# Patient Record
Sex: Male | Born: 1970 | Race: White | Hispanic: No | Marital: Married | Smoking: Never smoker
Health system: Southern US, Community
[De-identification: ages and names within clinical notes are randomized; demographics above are authoritative.]

## PROBLEM LIST (undated history)

## (undated) DIAGNOSIS — H9312 Tinnitus, left ear: Secondary | ICD-10-CM

## (undated) DIAGNOSIS — H9192 Unspecified hearing loss, left ear: Secondary | ICD-10-CM

## (undated) DIAGNOSIS — F419 Anxiety disorder, unspecified: Secondary | ICD-10-CM

## (undated) HISTORY — PX: APPENDECTOMY: SHX54

---

## 2017-05-09 ENCOUNTER — Inpatient Hospital Stay (HOSPITAL_COMMUNITY)
Admission: EM | Admit: 2017-05-09 | Discharge: 2017-05-29 | DRG: 963 | Disposition: A | Discharge status: Rehabilitation Facility | Payer: BLUE CROSS/BLUE SHIELD | Attending: General Surgery | Admitting: General Surgery

## 2017-05-09 ENCOUNTER — Inpatient Hospital Stay (HOSPITAL_COMMUNITY): Payer: BLUE CROSS/BLUE SHIELD

## 2017-05-09 ENCOUNTER — Emergency Department (HOSPITAL_COMMUNITY): Payer: BLUE CROSS/BLUE SHIELD

## 2017-05-09 ENCOUNTER — Encounter (HOSPITAL_COMMUNITY): Payer: Self-pay | Admitting: Radiology

## 2017-05-09 ENCOUNTER — Other Ambulatory Visit: Payer: Self-pay

## 2017-05-09 DIAGNOSIS — R402142 Coma scale, eyes open, spontaneous, at arrival to emergency department: Secondary | ICD-10-CM | POA: Diagnosis present

## 2017-05-09 DIAGNOSIS — G8918 Other acute postprocedural pain: Secondary | ICD-10-CM

## 2017-05-09 DIAGNOSIS — R402342 Coma scale, best motor response, flexion withdrawal, at arrival to emergency department: Secondary | ICD-10-CM | POA: Diagnosis present

## 2017-05-09 DIAGNOSIS — S0219XA Other fracture of base of skull, initial encounter for closed fracture: Secondary | ICD-10-CM | POA: Diagnosis present

## 2017-05-09 DIAGNOSIS — S069X9A Unspecified intracranial injury with loss of consciousness of unspecified duration, initial encounter: Secondary | ICD-10-CM

## 2017-05-09 DIAGNOSIS — H9192 Unspecified hearing loss, left ear: Secondary | ICD-10-CM | POA: Diagnosis present

## 2017-05-09 DIAGNOSIS — S065X0A Traumatic subdural hemorrhage without loss of consciousness, initial encounter: Secondary | ICD-10-CM | POA: Diagnosis present

## 2017-05-09 DIAGNOSIS — S271XXA Traumatic hemothorax, initial encounter: Secondary | ICD-10-CM | POA: Diagnosis present

## 2017-05-09 DIAGNOSIS — H9319 Tinnitus, unspecified ear: Secondary | ICD-10-CM | POA: Diagnosis present

## 2017-05-09 DIAGNOSIS — J9690 Respiratory failure, unspecified, unspecified whether with hypoxia or hypercapnia: Secondary | ICD-10-CM

## 2017-05-09 DIAGNOSIS — S0101XA Laceration without foreign body of scalp, initial encounter: Secondary | ICD-10-CM | POA: Diagnosis present

## 2017-05-09 DIAGNOSIS — G478 Other sleep disorders: Secondary | ICD-10-CM | POA: Diagnosis not present

## 2017-05-09 DIAGNOSIS — S2241XA Multiple fractures of ribs, right side, initial encounter for closed fracture: Secondary | ICD-10-CM | POA: Diagnosis present

## 2017-05-09 DIAGNOSIS — Z823 Family history of stroke: Secondary | ICD-10-CM

## 2017-05-09 DIAGNOSIS — F1910 Other psychoactive substance abuse, uncomplicated: Secondary | ICD-10-CM

## 2017-05-09 DIAGNOSIS — S065XAA Traumatic subdural hemorrhage with loss of consciousness status unknown, initial encounter: Secondary | ICD-10-CM

## 2017-05-09 DIAGNOSIS — D696 Thrombocytopenia, unspecified: Secondary | ICD-10-CM

## 2017-05-09 DIAGNOSIS — S020XXA Fracture of vault of skull, initial encounter for closed fracture: Secondary | ICD-10-CM | POA: Diagnosis present

## 2017-05-09 DIAGNOSIS — E8809 Other disorders of plasma-protein metabolism, not elsewhere classified: Secondary | ICD-10-CM | POA: Diagnosis present

## 2017-05-09 DIAGNOSIS — Z886 Allergy status to analgesic agent status: Secondary | ICD-10-CM | POA: Diagnosis not present

## 2017-05-09 DIAGNOSIS — S062X9A Diffuse traumatic brain injury with loss of consciousness of unspecified duration, initial encounter: Secondary | ICD-10-CM | POA: Diagnosis not present

## 2017-05-09 DIAGNOSIS — D62 Acute posthemorrhagic anemia: Secondary | ICD-10-CM | POA: Diagnosis present

## 2017-05-09 DIAGNOSIS — E876 Hypokalemia: Secondary | ICD-10-CM | POA: Diagnosis not present

## 2017-05-09 DIAGNOSIS — R402242 Coma scale, best verbal response, confused conversation, at arrival to emergency department: Secondary | ICD-10-CM | POA: Diagnosis present

## 2017-05-09 DIAGNOSIS — Z87820 Personal history of traumatic brain injury: Secondary | ICD-10-CM | POA: Diagnosis not present

## 2017-05-09 DIAGNOSIS — R4587 Impulsiveness: Secondary | ICD-10-CM | POA: Diagnosis present

## 2017-05-09 DIAGNOSIS — I10 Essential (primary) hypertension: Secondary | ICD-10-CM | POA: Diagnosis present

## 2017-05-09 DIAGNOSIS — J690 Pneumonitis due to inhalation of food and vomit: Secondary | ICD-10-CM | POA: Diagnosis not present

## 2017-05-09 DIAGNOSIS — M25511 Pain in right shoulder: Secondary | ICD-10-CM | POA: Diagnosis not present

## 2017-05-09 DIAGNOSIS — S0231XA Fracture of orbital floor, right side, initial encounter for closed fracture: Secondary | ICD-10-CM | POA: Diagnosis present

## 2017-05-09 DIAGNOSIS — M79671 Pain in right foot: Secondary | ICD-10-CM

## 2017-05-09 DIAGNOSIS — S36892A Contusion of other intra-abdominal organs, initial encounter: Secondary | ICD-10-CM | POA: Diagnosis present

## 2017-05-09 DIAGNOSIS — R0682 Tachypnea, not elsewhere classified: Secondary | ICD-10-CM | POA: Diagnosis not present

## 2017-05-09 DIAGNOSIS — G4700 Insomnia, unspecified: Secondary | ICD-10-CM | POA: Diagnosis present

## 2017-05-09 DIAGNOSIS — R1310 Dysphagia, unspecified: Secondary | ICD-10-CM | POA: Diagnosis present

## 2017-05-09 DIAGNOSIS — S065X9A Traumatic subdural hemorrhage with loss of consciousness of unspecified duration, initial encounter: Secondary | ICD-10-CM | POA: Diagnosis not present

## 2017-05-09 DIAGNOSIS — S020XXD Fracture of vault of skull, subsequent encounter for fracture with routine healing: Secondary | ICD-10-CM | POA: Diagnosis present

## 2017-05-09 DIAGNOSIS — R569 Unspecified convulsions: Secondary | ICD-10-CM | POA: Diagnosis present

## 2017-05-09 DIAGNOSIS — R000 Tachycardia, unspecified: Secondary | ICD-10-CM

## 2017-05-09 DIAGNOSIS — J9601 Acute respiratory failure with hypoxia: Secondary | ICD-10-CM | POA: Diagnosis present

## 2017-05-09 DIAGNOSIS — Z978 Presence of other specified devices: Secondary | ICD-10-CM

## 2017-05-09 DIAGNOSIS — R451 Restlessness and agitation: Secondary | ICD-10-CM | POA: Diagnosis not present

## 2017-05-09 DIAGNOSIS — R52 Pain, unspecified: Secondary | ICD-10-CM | POA: Diagnosis not present

## 2017-05-09 DIAGNOSIS — R509 Fever, unspecified: Secondary | ICD-10-CM

## 2017-05-09 DIAGNOSIS — S069X9S Unspecified intracranial injury with loss of consciousness of unspecified duration, sequela: Secondary | ICD-10-CM | POA: Diagnosis not present

## 2017-05-09 DIAGNOSIS — S069X9D Unspecified intracranial injury with loss of consciousness of unspecified duration, subsequent encounter: Secondary | ICD-10-CM | POA: Diagnosis not present

## 2017-05-09 HISTORY — DX: Unspecified hearing loss, left ear: H91.92

## 2017-05-09 HISTORY — DX: Anxiety disorder, unspecified: F41.9

## 2017-05-09 HISTORY — DX: Gilbert syndrome: E80.4

## 2017-05-09 HISTORY — DX: Tinnitus, left ear: H93.12

## 2017-05-09 LAB — COMPREHENSIVE METABOLIC PANEL
ALK PHOS: 84 U/L (ref 38–126)
ALT: 27 U/L (ref 17–63)
ANION GAP: 12 (ref 5–15)
AST: 35 U/L (ref 15–41)
Albumin: 4.2 g/dL (ref 3.5–5.0)
BILIRUBIN TOTAL: 1.1 mg/dL (ref 0.3–1.2)
BUN: 12 mg/dL (ref 6–20)
CALCIUM: 9.1 mg/dL (ref 8.9–10.3)
CO2: 22 mmol/L (ref 22–32)
Chloride: 104 mmol/L (ref 101–111)
Creatinine, Ser: 0.95 mg/dL (ref 0.61–1.24)
GFR calc Af Amer: >60 mL/min (ref >60)
GLUCOSE: 140 mg/dL — AB (ref 65–99)
POTASSIUM: 3 mmol/L — AB (ref 3.5–5.1)
Sodium: 138 mmol/L (ref 135–145)
TOTAL PROTEIN: 7.1 g/dL (ref 6.5–8.1)

## 2017-05-09 LAB — BPAM FFP
BLOOD PRODUCT EXPIRATION DATE: 201812232359
Blood Product Expiration Date: 201812232359
ISSUE DATE / TIME: 201812151347
ISSUE DATE / TIME: 201812151347
UNIT TYPE AND RH: 6200
Unit Type and Rh: 6200

## 2017-05-09 LAB — CBC
HEMATOCRIT: 43.5 % (ref 39.0–52.0)
HEMOGLOBIN: 15.5 g/dL (ref 13.0–17.0)
MCH: 33.8 pg (ref 26.0–34.0)
MCHC: 35.6 g/dL (ref 30.0–36.0)
MCV: 95 fL (ref 78.0–100.0)
Platelets: 257 10*3/uL (ref 150–400)
RBC: 4.58 MIL/uL (ref 4.22–5.81)
RDW: 12.3 % (ref 11.5–15.5)
WBC: 13.7 10*3/uL — AB (ref 4.0–10.5)

## 2017-05-09 LAB — I-STAT ARTERIAL BLOOD GAS, ED
Acid-base deficit: 6 mmol/L — ABNORMAL HIGH (ref 0.0–2.0)
Bicarbonate: 21.7 mmol/L (ref 20.0–28.0)
O2 Saturation: 77 %
PH ART: 7.225 — AB (ref 7.350–7.450)
TCO2: 23 mmol/L (ref 22–32)
pCO2 arterial: 52.4 mmHg — ABNORMAL HIGH (ref 32.0–48.0)
pO2, Arterial: 50 mmHg — ABNORMAL LOW (ref 83.0–108.0)

## 2017-05-09 LAB — TYPE AND SCREEN
ABO/RH(D): O POS
Antibody Screen: NEGATIVE
UNIT DIVISION: 0
Unit division: 0

## 2017-05-09 LAB — I-STAT CHEM 8, ED
BUN: 14 mg/dL (ref 6–20)
CALCIUM ION: 1.13 mmol/L — AB (ref 1.15–1.40)
CHLORIDE: 104 mmol/L (ref 101–111)
CREATININE: 0.9 mg/dL (ref 0.61–1.24)
Glucose, Bld: 142 mg/dL — ABNORMAL HIGH (ref 65–99)
HCT: 45 % (ref 39.0–52.0)
Hemoglobin: 15.3 g/dL (ref 13.0–17.0)
Potassium: 3.3 mmol/L — ABNORMAL LOW (ref 3.5–5.1)
Sodium: 143 mmol/L (ref 135–145)
TCO2: 25 mmol/L (ref 22–32)

## 2017-05-09 LAB — PREPARE FRESH FROZEN PLASMA
UNIT DIVISION: 0
Unit division: 0

## 2017-05-09 LAB — RAPID URINE DRUG SCREEN, HOSP PERFORMED
AMPHETAMINES: POSITIVE — AB
BENZODIAZEPINES: POSITIVE — AB
Barbiturates: NOT DETECTED
Cocaine: NOT DETECTED
Opiates: NOT DETECTED
TETRAHYDROCANNABINOL: POSITIVE — AB

## 2017-05-09 LAB — URINALYSIS, ROUTINE W REFLEX MICROSCOPIC
Bilirubin Urine: NEGATIVE
GLUCOSE, UA: NEGATIVE mg/dL
Ketones, ur: NEGATIVE mg/dL
Leukocytes, UA: NEGATIVE
NITRITE: NEGATIVE
PH: 5 (ref 5.0–8.0)
Protein, ur: 30 mg/dL — AB
Specific Gravity, Urine: >1.046 — ABNORMAL HIGH (ref 1.005–1.030)

## 2017-05-09 LAB — ETHANOL

## 2017-05-09 LAB — BPAM RBC
BLOOD PRODUCT EXPIRATION DATE: 201812222359
Blood Product Expiration Date: 201812212359
ISSUE DATE / TIME: 201812151346
ISSUE DATE / TIME: 201812151346
UNIT TYPE AND RH: 9500
Unit Type and Rh: 9500

## 2017-05-09 LAB — ABO/RH: ABO/RH(D): O POS

## 2017-05-09 LAB — MRSA PCR SCREENING: MRSA BY PCR: NEGATIVE

## 2017-05-09 LAB — PROTIME-INR
INR: 1.07
PROTHROMBIN TIME: 13.8 s (ref 11.4–15.2)

## 2017-05-09 LAB — TRIGLYCERIDES: TRIGLYCERIDES: 76 mg/dL (ref <150)

## 2017-05-09 LAB — I-STAT CG4 LACTIC ACID, ED: Lactic Acid, Venous: 3.49 mmol/L (ref 0.5–1.9)

## 2017-05-09 LAB — CDS SEROLOGY

## 2017-05-09 LAB — LACTIC ACID, PLASMA: LACTIC ACID, VENOUS: 3.3 mmol/L — AB (ref 0.5–1.9)

## 2017-05-09 MED ORDER — SODIUM CHLORIDE 0.9 % IV SOLN
INTRAVENOUS | Status: DC
  Administered 2017-05-09 – 2017-05-20 (×14): via INTRAVENOUS
  Administered 2017-05-20: 75 mL/h via INTRAVENOUS
  Administered 2017-05-21 – 2017-05-23 (×4): via INTRAVENOUS
  Administered 2017-05-24: 75 mL/h via INTRAVENOUS
  Administered 2017-05-25: 1000 mL via INTRAVENOUS
  Administered 2017-05-26 – 2017-05-27 (×2): via INTRAVENOUS

## 2017-05-09 MED ORDER — ONDANSETRON 4 MG PO TBDP: 4.0000 mg | ORAL_TABLET | Freq: Four times a day (QID) | ORAL | Status: DC | PRN

## 2017-05-09 MED ORDER — ROCURONIUM BROMIDE 50 MG/5ML IV SOLN
INTRAVENOUS | Status: AC | PRN
  Administered 2017-05-09: 80 mg via INTRAVENOUS

## 2017-05-09 MED ORDER — IOPAMIDOL (ISOVUE-300) INJECTION 61%
INTRAVENOUS | Status: AC
  Administered 2017-05-09: 100 mL
  Filled 2017-05-09: qty 100

## 2017-05-09 MED ORDER — ORAL CARE MOUTH RINSE: 15.0000 mL | Freq: Four times a day (QID) | OROMUCOSAL | Status: DC

## 2017-05-09 MED ORDER — SODIUM CHLORIDE 0.9 % IV SOLN
500.0000 mg | Freq: Two times a day (BID) | INTRAVENOUS | Status: DC
  Administered 2017-05-09 – 2017-05-21 (×24): 500 mg via INTRAVENOUS
  Filled 2017-05-09 (×25): qty 5

## 2017-05-09 MED ORDER — PROPOFOL 1000 MG/100ML IV EMUL
5.0000 ug/kg/min | Freq: Once | INTRAVENOUS | Status: AC
  Filled 2017-05-09: qty 100

## 2017-05-09 MED ORDER — FENTANYL CITRATE (PF) 100 MCG/2ML IJ SOLN
INTRAMUSCULAR | Status: AC
  Filled 2017-05-09: qty 2

## 2017-05-09 MED ORDER — PROPOFOL 1000 MG/100ML IV EMUL
INTRAVENOUS | Status: AC
  Administered 2017-05-09: 14:00:00
  Filled 2017-05-09: qty 100

## 2017-05-09 MED ORDER — LORAZEPAM 2 MG/ML IJ SOLN
INTRAMUSCULAR | Status: AC
  Filled 2017-05-09: qty 1

## 2017-05-09 MED ORDER — PANTOPRAZOLE SODIUM 40 MG PO TBEC
40.0000 mg | DELAYED_RELEASE_TABLET | Freq: Every day | ORAL | Status: DC
  Administered 2017-05-15 – 2017-05-20 (×5): 40 mg via ORAL
  Filled 2017-05-09 (×5): qty 1

## 2017-05-09 MED ORDER — LORAZEPAM 2 MG/ML IJ SOLN
INTRAMUSCULAR | Status: AC | PRN
  Administered 2017-05-09: 1 mg via INTRAVENOUS

## 2017-05-09 MED ORDER — ORAL CARE MOUTH RINSE
15.0000 mL | OROMUCOSAL | Status: DC
  Administered 2017-05-09 – 2017-05-13 (×29): 15 mL via OROMUCOSAL

## 2017-05-09 MED ORDER — SENNOSIDES 8.8 MG/5ML PO SYRP: 5.0000 mL | ORAL_SOLUTION | Freq: Two times a day (BID) | ORAL | Status: DC | PRN

## 2017-05-09 MED ORDER — PROPOFOL 1000 MG/100ML IV EMUL
INTRAVENOUS | Status: AC
  Filled 2017-05-09: qty 100

## 2017-05-09 MED ORDER — ETOMIDATE 2 MG/ML IV SOLN
INTRAVENOUS | Status: AC | PRN
  Administered 2017-05-09: 20 mg via INTRAVENOUS

## 2017-05-09 MED ORDER — PROPOFOL 1000 MG/100ML IV EMUL
0.0000 ug/kg/min | INTRAVENOUS | Status: DC
  Administered 2017-05-09 – 2017-05-10 (×6): 30 ug/kg/min via INTRAVENOUS
  Administered 2017-05-11 – 2017-05-12 (×5): 40 ug/kg/min via INTRAVENOUS
  Filled 2017-05-09 (×12): qty 100

## 2017-05-09 MED ORDER — SODIUM CHLORIDE 0.9 % IV SOLN
INTRAVENOUS | Status: AC | PRN
  Administered 2017-05-09: 1000 mL via INTRAVENOUS

## 2017-05-09 MED ORDER — ONDANSETRON HCL 4 MG/2ML IJ SOLN
4.0000 mg | Freq: Four times a day (QID) | INTRAMUSCULAR | Status: DC | PRN
  Filled 2017-05-09: qty 2

## 2017-05-09 MED ORDER — FENTANYL 2500MCG IN NS 250ML (10MCG/ML) PREMIX INFUSION
25.0000 ug/h | INTRAVENOUS | Status: DC
  Administered 2017-05-09: 50 ug/h via INTRAVENOUS
  Administered 2017-05-10 (×2): 75 ug/h via INTRAVENOUS
  Administered 2017-05-12: 100 ug/h via INTRAVENOUS
  Filled 2017-05-09 (×4): qty 250

## 2017-05-09 MED ORDER — FENTANYL BOLUS VIA INFUSION
50.0000 ug | INTRAVENOUS | Status: DC | PRN
  Administered 2017-05-10: 50 ug via INTRAVENOUS
  Filled 2017-05-09: qty 50

## 2017-05-09 MED ORDER — PANTOPRAZOLE SODIUM 40 MG IV SOLR
40.0000 mg | Freq: Every day | INTRAVENOUS | Status: DC
  Administered 2017-05-09 – 2017-05-16 (×7): 40 mg via INTRAVENOUS
  Filled 2017-05-09 (×7): qty 40

## 2017-05-09 MED ORDER — BISACODYL 10 MG RE SUPP
10.0000 mg | Freq: Every day | RECTAL | Status: DC | PRN
  Filled 2017-05-09: qty 1

## 2017-05-09 MED ORDER — FENTANYL CITRATE (PF) 100 MCG/2ML IJ SOLN: 50.0000 ug | Freq: Once | INTRAMUSCULAR | Status: DC

## 2017-05-09 MED ORDER — CHLORHEXIDINE GLUCONATE 0.12% ORAL RINSE (MEDLINE KIT): 15.0000 mL | Freq: Two times a day (BID) | OROMUCOSAL | Status: DC

## 2017-05-09 MED ORDER — CHLORHEXIDINE GLUCONATE 0.12% ORAL RINSE (MEDLINE KIT)
15.0000 mL | Freq: Two times a day (BID) | OROMUCOSAL | Status: DC
  Administered 2017-05-09 – 2017-05-29 (×37): 15 mL via OROMUCOSAL

## 2017-05-09 MED ORDER — LACTATED RINGERS IV BOLUS (SEPSIS)
1000.0000 mL | Freq: Once | INTRAVENOUS | Status: AC
  Administered 2017-05-09: 1000 mL via INTRAVENOUS

## 2017-05-09 NOTE — ED Notes (Signed)
[name] at bedside.  Abigail,PA in to talk with family.

## 2017-05-09 NOTE — ED Provider Notes (Signed)
..  Laceration Repair Date/Time: 05/09/2017 3:18 PM Performed by: Arthor CaptainHarris, Travonta Gill, PA-C Authorized by: Arthor CaptainHarris, Phi Avans, PA-C   Consent:    Consent obtained:  Emergent situation Anesthesia (see MAR for exact dosages):    Anesthesia method:  None Laceration details:    Location:  Scalp   Scalp location:  Frontal Repair type:    Repair type:  Simple Treatment:    Area cleansed with:  Betadine   Amount of cleaning:  Standard   Irrigation solution:  Sterile saline Skin repair:    Repair method:  Staples   Number of staples:  [id number]      Kenia Teagarden, PA-C 05/09/17 1520    Rolan BuccoBelfi, Melanie, MD 05/09/17 1550

## 2017-05-09 NOTE — Progress Notes (Signed)
ABG ordered for post intubation however venous sample obtained.  Will get another RT to attempt and get results into computer.     Ref. Range 05/09/2017 15:28  Sample type Unknown ARTERIAL  pH, Arterial Latest Ref Range: 7.350 - 7.450  7.225 (L)  pCO2 arterial Latest Ref Range: 32.0 - 48.0 mmHg 52.4 (H)  pO2, Arterial Latest Ref Range: 83.0 - 108.0 mmHg 50.0 (L)  TCO2 Latest Ref Range: 22 - 32 mmol/L 23  Acid-base deficit Latest Ref Range: 0.0 - 2.0 mmol/L 6.0 (H)  Bicarbonate Latest Ref Range: 20.0 - 28.0 mmol/L 21.7  O2 Saturation Latest Units: % 77.0  Patient temperature Unknown 98.6 F  Collection site Unknown RADIAL, [facility]EN[facility] T.Marland Kitchen..Marland Kitchen
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
## 2017-05-09 NOTE — ED Notes (Signed)
MD Meriam SpragueBeverly called neuro surgery to make aware of patient.
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
## 2017-05-09 NOTE — ED Notes (Signed)
Pt had seizure like activity while Abby PA was placing sutures and was given 1mg  Ativan IV.

## 2017-05-09 NOTE — ED Notes (Signed)
Taken to ct with RN present

## 2017-05-09 NOTE — ED Notes (Signed)
Bed side report given to Woodroe ChenKim Fields RN

## 2017-05-09 NOTE — Progress Notes (Signed)
Patient transported from ED to room 4N28 without complications.
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
## 2017-05-10 ENCOUNTER — Inpatient Hospital Stay (HOSPITAL_COMMUNITY): Payer: BLUE CROSS/BLUE SHIELD

## 2017-05-10 ENCOUNTER — Other Ambulatory Visit: Payer: Self-pay

## 2017-05-10 LAB — HIV ANTIBODY (ROUTINE TESTING W REFLEX): HIV Screen 4th Generation wRfx: NONREACTIVE

## 2017-05-10 LAB — CBC
HCT: 35.5 % — ABNORMAL LOW (ref 39.0–52.0)
Hemoglobin: 12.2 g/dL — ABNORMAL LOW (ref 13.0–17.0)
MCH: 33.9 pg (ref 26.0–34.0)
MCHC: 34.4 g/dL (ref 30.0–36.0)
MCV: 98.6 fL (ref 78.0–100.0)
PLATELETS: 147 10*3/uL — AB (ref 150–400)
RBC: 3.6 MIL/uL — ABNORMAL LOW (ref 4.22–5.81)
RDW: 13 % (ref 11.5–15.5)
WBC: 8 10*3/uL (ref 4.0–10.5)

## 2017-05-10 LAB — BASIC METABOLIC PANEL
Anion gap: 9 (ref 5–15)
BUN: 12 mg/dL (ref 6–20)
CALCIUM: 8 mg/dL — AB (ref 8.9–10.3)
CO2: 20 mmol/L — ABNORMAL LOW (ref 22–32)
Chloride: 110 mmol/L (ref 101–111)
Creatinine, Ser: 0.78 mg/dL (ref 0.61–1.24)
GFR calc Af Amer: >60 mL/min (ref >60)
GLUCOSE: 83 mg/dL (ref 65–99)
POTASSIUM: 4 mmol/L (ref 3.5–5.1)
Sodium: 139 mmol/L (ref 135–145)

## 2017-05-10 LAB — PROTIME-INR
INR: 1.25
PROTHROMBIN TIME: 15.6 s — AB (ref 11.4–15.2)
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
## 2017-05-11 LAB — PHOSPHORUS
PHOSPHORUS: 3.5 mg/dL (ref 2.5–4.6)
Phosphorus: 2.8 mg/dL (ref 2.5–4.6)

## 2017-05-11 LAB — MAGNESIUM
MAGNESIUM: 1.8 mg/dL (ref 1.7–2.4)
MAGNESIUM: 1.7 mg/dL (ref 1.7–2.4)

## 2017-05-11 LAB — GLUCOSE, CAPILLARY
GLUCOSE-CAPILLARY: 88 mg/dL (ref 65–99)
GLUCOSE-CAPILLARY: 102 mg/dL — AB (ref 65–99)
Glucose-Capillary: 121 mg/dL — ABNORMAL HIGH (ref 65–99)

## 2017-05-11 LAB — BLOOD PRODUCT ORDER (VERBAL) VERIFICATION

## 2017-05-11 MED ORDER — PRO-STAT SUGAR FREE PO LIQD: 30.0000 mL | Freq: Two times a day (BID) | ORAL | Status: DC

## 2017-05-11 MED ORDER — VITAL HIGH PROTEIN PO LIQD: 1000.0000 mL | ORAL | Status: DC

## 2017-05-11 MED ORDER — ACETAMINOPHEN 160 MG/5ML PO SOLN
650.0000 mg | ORAL | Status: DC | PRN
  Administered 2017-05-11: 650 mg
  Filled 2017-05-11: qty 20.3

## 2017-05-11 MED ORDER — VITAL HIGH PROTEIN PO LIQD
1000.0000 mL | ORAL | Status: DC
  Administered 2017-05-11: 1000 mL
  Filled 2017-05-11 (×2): qty 1000
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
## 2017-05-12 ENCOUNTER — Inpatient Hospital Stay (HOSPITAL_COMMUNITY): Payer: BLUE CROSS/BLUE SHIELD

## 2017-05-12 ENCOUNTER — Encounter (HOSPITAL_COMMUNITY): Payer: Self-pay

## 2017-05-12 LAB — GLUCOSE, CAPILLARY
GLUCOSE-CAPILLARY: 118 mg/dL — AB (ref 65–99)
GLUCOSE-CAPILLARY: 122 mg/dL — AB (ref 65–99)
Glucose-Capillary: 102 mg/dL — ABNORMAL HIGH (ref 65–99)
Glucose-Capillary: 119 mg/dL — ABNORMAL HIGH (ref 65–99)
Glucose-Capillary: 114 mg/dL — ABNORMAL HIGH (ref 65–99)

## 2017-05-12 LAB — BASIC METABOLIC PANEL
ANION GAP: 4 — AB (ref 5–15)
BUN: 9 mg/dL (ref 6–20)
CHLORIDE: 111 mmol/L (ref 101–111)
CO2: 24 mmol/L (ref 22–32)
Calcium: 7.8 mg/dL — ABNORMAL LOW (ref 8.9–10.3)
Creatinine, Ser: 0.64 mg/dL (ref 0.61–1.24)
GFR calc non Af Amer: >60 mL/min (ref >60)
Glucose, Bld: 110 mg/dL — ABNORMAL HIGH (ref 65–99)
POTASSIUM: 3.4 mmol/L — AB (ref 3.5–5.1)
SODIUM: 139 mmol/L (ref 135–145)

## 2017-05-12 LAB — CBC
HCT: 25.8 % — ABNORMAL LOW (ref 39.0–52.0)
HEMOGLOBIN: 8.8 g/dL — AB (ref 13.0–17.0)
MCH: 33.2 pg (ref 26.0–34.0)
MCHC: 34.1 g/dL (ref 30.0–36.0)
MCV: 97.4 fL (ref 78.0–100.0)
Platelets: 133 10*3/uL — ABNORMAL LOW (ref 150–400)
RBC: 2.65 MIL/uL — AB (ref 4.22–5.81)
RDW: 12.7 % (ref 11.5–15.5)
WBC: 7 10*3/uL (ref 4.0–10.5)

## 2017-05-12 LAB — PHOSPHORUS
PHOSPHORUS: 1.7 mg/dL — AB (ref 2.5–4.6)
PHOSPHORUS: 2.3 mg/dL — AB (ref 2.5–4.6)

## 2017-05-12 LAB — MAGNESIUM
MAGNESIUM: 1.9 mg/dL (ref 1.7–2.4)
Magnesium: 1.9 mg/dL (ref 1.7–2.4)

## 2017-05-12 LAB — TRIGLYCERIDES: Triglycerides: 69 mg/dL (ref <150)

## 2017-05-12 MED ORDER — LORAZEPAM 2 MG/ML IJ SOLN
2.0000 mg | Freq: Once | INTRAMUSCULAR | Status: AC
  Administered 2017-05-12: 2 mg via INTRAMUSCULAR

## 2017-05-12 MED ORDER — DEXMEDETOMIDINE HCL IN NACL 400 MCG/100ML IV SOLN
0.4000 ug/kg/h | INTRAVENOUS | Status: DC
  Administered 2017-05-13 (×2): 0.8 ug/kg/h via INTRAVENOUS
  Administered 2017-05-13: 1.2 ug/kg/h via INTRAVENOUS
  Administered 2017-05-13: 0.8 ug/kg/h via INTRAVENOUS
  Administered 2017-05-13 (×2): 1 ug/kg/h via INTRAVENOUS
  Administered 2017-05-14: 1.2 ug/kg/h via INTRAVENOUS
  Administered 2017-05-14 (×2): 0.6 ug/kg/h via INTRAVENOUS
  Administered 2017-05-14: 0.7 ug/kg/h via INTRAVENOUS
  Administered 2017-05-14: 1.2 ug/kg/h via INTRAVENOUS
  Administered 2017-05-15: 0.8 ug/kg/h via INTRAVENOUS
  Administered 2017-05-15 (×2): 0.6 ug/kg/h via INTRAVENOUS
  Administered 2017-05-15 – 2017-05-16 (×2): 0.8 ug/kg/h via INTRAVENOUS
  Administered 2017-05-16: 0.7 ug/kg/h via INTRAVENOUS
  Administered 2017-05-16 (×2): 0.8 ug/kg/h via INTRAVENOUS
  Administered 2017-05-17 (×3): 1 ug/kg/h via INTRAVENOUS
  Administered 2017-05-17 – 2017-05-18 (×2): 0.8 ug/kg/h via INTRAVENOUS
  Administered 2017-05-18 (×5): 1 ug/kg/h via INTRAVENOUS
  Administered 2017-05-19: 0.8 ug/kg/h via INTRAVENOUS
  Administered 2017-05-19: 1 ug/kg/h via INTRAVENOUS
  Administered 2017-05-19 – 2017-05-20 (×2): 0.4 ug/kg/h via INTRAVENOUS
  Filled 2017-05-12 (×35): qty 100

## 2017-05-12 MED ORDER — DEXMEDETOMIDINE HCL IN NACL 200 MCG/50ML IV SOLN
0.4000 ug/kg/h | INTRAVENOUS | Status: DC
  Administered 2017-05-12: 0.6 ug/kg/h via INTRAVENOUS
  Administered 2017-05-12: 0.4 ug/kg/h via INTRAVENOUS
  Administered 2017-05-12: 0.5 ug/kg/h via INTRAVENOUS
  Filled 2017-05-12 (×4): qty 50

## 2017-05-12 MED ORDER — FENTANYL CITRATE (PF) 100 MCG/2ML IJ SOLN
50.0000 ug | INTRAMUSCULAR | Status: DC | PRN
  Administered 2017-05-12 – 2017-05-22 (×41): 100 ug via INTRAVENOUS
  Filled 2017-05-12 (×43): qty 2

## 2017-05-12 MED ORDER — LORAZEPAM 2 MG/ML IJ SOLN
INTRAMUSCULAR | Status: AC
  Filled 2017-05-12: qty 1

## 2017-05-12 MED ORDER — HALOPERIDOL LACTATE 5 MG/ML IJ SOLN
5.0000 mg | Freq: Four times a day (QID) | INTRAMUSCULAR | Status: DC | PRN
  Administered 2017-05-12 – 2017-05-15 (×3): 5 mg via INTRAMUSCULAR
  Filled 2017-05-12 (×3): qty 1
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
## 2017-05-12 NOTE — Procedures (Signed)
Extubation Procedure Note  Patient Details:   Name: Anthony Singleton DOB: 03-06-1971 MRN: [id number]   Airway Documentation:  Airway 8 mm (Active)  Secured at (cm) 24 cm 05/12/2017  7:30 AM  Measured From Lips 05/12/2017  7:30 AM  Secured Location Center 05/12/2017  7:30 AM  Secured By [employer] Tube Holder 05/12/2017  7:30 AM  Tube Holder Repositioned Yes 05/12/2017  7:30 AM  Cuff Pressure (cm H2O) 28 cm H2O 05/11/2017 11:11 PM  Site Condition Dry 05/12/2017  7:30 AM    Evaluation  O2 sats: stable throughout Complications: No apparent complications Patient did tolerate procedure well. Bilateral Breath Sounds: Clear, Diminished   Yes   Patient was extubated to a 4L [place] without any complications, dyspnea or stridor noted.   Amear Strojny, [name] 05/12/2017, 8:20 AM

## 2017-05-12 NOTE — Consult Note (Signed)
Follow-up - no interval CT head, supposedly scheduled for tomorrow.  Patient still heavily sedated.  Zenaida NieceVan: OD: Unable, pupils pharmacologically miotic, minimally reactive OS: Unable, pupils pharmacologically miotic, minimally reactive  CVF: OD: Unable OS: Unable  EOM: OD: Unable due to sedation OS: Unable due to sedation  Pupils: OD: 2.25- 1.775mm, no APD OS: 2.25- 1.745mm, no APD  IOP: by [place] OD: 18 OS: 11  External: OD: 2+ Periorbital edema and 2+ ecchymosis, no lid lacerations, mild proptosis OS: Trace periorbital edema and ecchymosis, no lid lacerations  PF: 0/0  Pen Light Exam: L/L: OD: No lid lacerations OS: No lid lacerations  C/S: OD: Trace temporal chemosis, trace injection, trace nasal chemosis, no SCH OS: Trace injection and chemosis, no lacerations, no SCH  K: OD: clear, no abnormal staining OS: clear, no abnormal staining  A/C: OD: grossly deep and quiet appearing by pen light, formed OS: grossly deep and quiet appearing by pen light, formed  I: OD: round and regular OS: round and regular  L: OD: Clear OS: Clear  A/P:  1. Right Orbital Roof and Floor Fractures - No clinical evidence of open globe - Monitor, no intervention from ophthalmic standpoint  2. Right Intraorbital Hematoma - Recommend repeat CT Head - I will review from the office - IOP is stable -- monitor -- no sign of compartment syndrome or compressive optic neuropathy - If hematoma continues to improve, I will sign off unless clinically indicated for re-consult.   - Please call with any questions.   Wynell Balloonhristopher Weaver, MD,[facility] Ophthalmology [phone]
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
## 2017-05-12 NOTE — Progress Notes (Signed)
Rehab Admissions Coordinator Note:  Patient was screened by Trish MageLogue, Hiedi Touchton M for appropriateness for an Inpatient Acute Rehab Consult.  At this time, we are recommending Inpatient Rehab consult.  Lelon FrohlichLogue, Mirayah Wren M 05/12/2017, 2:23 PM  I can be reached at [phone].
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
## 2017-05-13 ENCOUNTER — Encounter (HOSPITAL_COMMUNITY): Payer: Self-pay

## 2017-05-13 ENCOUNTER — Inpatient Hospital Stay (HOSPITAL_COMMUNITY): Payer: BLUE CROSS/BLUE SHIELD

## 2017-05-13 DIAGNOSIS — S065XAA Traumatic subdural hemorrhage with loss of consciousness status unknown, initial encounter: Secondary | ICD-10-CM

## 2017-05-13 DIAGNOSIS — S069X9A Unspecified intracranial injury with loss of consciousness of unspecified duration, initial encounter: Secondary | ICD-10-CM

## 2017-05-13 DIAGNOSIS — D62 Acute posthemorrhagic anemia: Secondary | ICD-10-CM

## 2017-05-13 DIAGNOSIS — R569 Unspecified convulsions: Secondary | ICD-10-CM

## 2017-05-13 DIAGNOSIS — R0682 Tachypnea, not elsewhere classified: Secondary | ICD-10-CM

## 2017-05-13 DIAGNOSIS — D696 Thrombocytopenia, unspecified: Secondary | ICD-10-CM

## 2017-05-13 DIAGNOSIS — E876 Hypokalemia: Secondary | ICD-10-CM

## 2017-05-13 DIAGNOSIS — F1910 Other psychoactive substance abuse, uncomplicated: Secondary | ICD-10-CM

## 2017-05-13 DIAGNOSIS — G8918 Other acute postprocedural pain: Secondary | ICD-10-CM

## 2017-05-13 DIAGNOSIS — S069X9D Unspecified intracranial injury with loss of consciousness of unspecified duration, subsequent encounter: Secondary | ICD-10-CM

## 2017-05-13 DIAGNOSIS — S065X9A Traumatic subdural hemorrhage with loss of consciousness of unspecified duration, initial encounter: Secondary | ICD-10-CM

## 2017-05-13 LAB — BASIC METABOLIC PANEL
ANION GAP: 10 (ref 5–15)
BUN: 11 mg/dL (ref 6–20)
CALCIUM: 8.2 mg/dL — AB (ref 8.9–10.3)
CO2: 21 mmol/L — AB (ref 22–32)
CREATININE: 0.71 mg/dL (ref 0.61–1.24)
Chloride: 109 mmol/L (ref 101–111)
GFR calc Af Amer: >60 mL/min (ref >60)
GLUCOSE: 116 mg/dL — AB (ref 65–99)
Potassium: 3.6 mmol/L (ref 3.5–5.1)
Sodium: 140 mmol/L (ref 135–145)

## 2017-05-13 LAB — GLUCOSE, CAPILLARY
GLUCOSE-CAPILLARY: 102 mg/dL — AB (ref 65–99)
GLUCOSE-CAPILLARY: 114 mg/dL — AB (ref 65–99)
Glucose-Capillary: 104 mg/dL — ABNORMAL HIGH (ref 65–99)
Glucose-Capillary: 114 mg/dL — ABNORMAL HIGH (ref 65–99)
Glucose-Capillary: 96 mg/dL (ref 65–99)
Glucose-Capillary: 97 mg/dL (ref 65–99)

## 2017-05-13 LAB — CBC
HEMATOCRIT: 24.9 % — AB (ref 39.0–52.0)
Hemoglobin: 8.6 g/dL — ABNORMAL LOW (ref 13.0–17.0)
MCH: 32.3 pg (ref 26.0–34.0)
MCHC: 34.5 g/dL (ref 30.0–36.0)
MCV: 93.6 fL (ref 78.0–100.0)
PLATELETS: 140 10*3/uL — AB (ref 150–400)
RBC: 2.66 MIL/uL — ABNORMAL LOW (ref 4.22–5.81)
RDW: 12.2 % (ref 11.5–15.5)
WBC: 6.6 10*3/uL (ref 4.0–10.5)

## 2017-05-13 MED ORDER — LORAZEPAM 2 MG/ML IJ SOLN
1.0000 mg | INTRAMUSCULAR | Status: DC | PRN
  Administered 2017-05-13 – 2017-05-22 (×27): 2 mg via INTRAVENOUS
  Filled 2017-05-13 (×28): qty 1

## 2017-05-13 MED ORDER — QUETIAPINE FUMARATE 25 MG PO TABS
25.0000 mg | ORAL_TABLET | Freq: Two times a day (BID) | ORAL | Status: DC
Start: 2017-05-13 — End: 2017-05-14
  Administered 2017-05-13 – 2017-05-14 (×2): 25 mg via ORAL
  Filled 2017-05-13 (×2): qty 1

## 2017-05-13 MED ORDER — ORAL CARE MOUTH RINSE
15.0000 mL | Freq: Three times a day (TID) | OROMUCOSAL | Status: DC
  Administered 2017-05-13 – 2017-05-28 (×44): 15 mL via OROMUCOSAL

## 2017-05-13 NOTE — Progress Notes (Signed)
Cortrak Tube Team Note:  Consult received to place a Cortrak feeding tube.   Cortrak tube placement attempted by Cortrak Team and by MD Janee Mornhompson. Pt very agitated and combative. Multiple attempts made, cortrak tube came out of pt's mouth each time. Pt also bleeding from nose and mouth. Cortrak tube placement unsuccessful at this time.     Romelle Starcherate Jaleeah Slight MS, RD, LDN, CNSC [phone] Pager  [phone] Weekend/On-Call Pager
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
## 2017-05-13 NOTE — Progress Notes (Signed)
Patient has just returned from CT scan. I will follow up with family and pt as appropriate. [phone]
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
## 2017-05-13 NOTE — Clinical Social Work Note (Signed)
Clinical Social Worker will complete SBIRT with patient once mental status more appropriate - patient alert to self only at this time.  Patient wife now aware of positive toxicology screen per RN.  CSW remains available and will follow up as necessary.  Macario GoldsJesse Brennin Durfee, [place] [phone]

## 2017-05-14 LAB — GLUCOSE, CAPILLARY
GLUCOSE-CAPILLARY: 92 mg/dL (ref 65–99)
Glucose-Capillary: 101 mg/dL — ABNORMAL HIGH (ref 65–99)
Glucose-Capillary: 104 mg/dL — ABNORMAL HIGH (ref 65–99)
Glucose-Capillary: 86 mg/dL (ref 65–99)
Glucose-Capillary: 89 mg/dL (ref 65–99)
Glucose-Capillary: 95 mg/dL (ref 65–99)

## 2017-05-14 LAB — BASIC METABOLIC PANEL
ANION GAP: 6 (ref 5–15)
BUN: 15 mg/dL (ref 6–20)
CO2: 21 mmol/L — AB (ref 22–32)
Calcium: 7.7 mg/dL — ABNORMAL LOW (ref 8.9–10.3)
Chloride: 114 mmol/L — ABNORMAL HIGH (ref 101–111)
Creatinine, Ser: 0.72 mg/dL (ref 0.61–1.24)
GLUCOSE: 102 mg/dL — AB (ref 65–99)
POTASSIUM: 3.2 mmol/L — AB (ref 3.5–5.1)
Sodium: 141 mmol/L (ref 135–145)

## 2017-05-14 LAB — CBC
HEMATOCRIT: 24 % — AB (ref 39.0–52.0)
Hemoglobin: 8.5 g/dL — ABNORMAL LOW (ref 13.0–17.0)
MCH: 33.2 pg (ref 26.0–34.0)
MCHC: 35.4 g/dL (ref 30.0–36.0)
MCV: 93.8 fL (ref 78.0–100.0)
Platelets: 153 10*3/uL (ref 150–400)
RBC: 2.56 MIL/uL — AB (ref 4.22–5.81)
RDW: 12.3 % (ref 11.5–15.5)
WBC: 5.9 10*3/uL (ref 4.0–10.5)

## 2017-05-14 MED ORDER — ACETAMINOPHEN 160 MG/5ML PO SOLN
650.0000 mg | ORAL | Status: DC | PRN
  Filled 2017-05-14: qty 20.3

## 2017-05-14 MED ORDER — QUETIAPINE FUMARATE 25 MG PO TABS
50.0000 mg | ORAL_TABLET | Freq: Two times a day (BID) | ORAL | Status: DC
  Administered 2017-05-14: 50 mg via ORAL
  Filled 2017-05-14: qty 2

## 2017-05-14 MED ORDER — OXYCODONE HCL 5 MG PO TABS
5.0000 mg | ORAL_TABLET | ORAL | Status: DC | PRN
  Administered 2017-05-15 – 2017-05-24 (×8): 10 mg via ORAL
  Administered 2017-05-25 (×2): 5 mg via ORAL
  Administered 2017-05-26: 10 mg via ORAL
  Administered 2017-05-26 – 2017-05-28 (×2): 5 mg via ORAL
  Filled 2017-05-14: qty 2
  Filled 2017-05-14: qty 1
  Filled 2017-05-14 (×3): qty 2
  Filled 2017-05-14 (×2): qty 1
  Filled 2017-05-14 (×4): qty 2
  Filled 2017-05-14: qty 1
  Filled 2017-05-14 (×2): qty 2
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
## 2017-05-14 NOTE — Progress Notes (Signed)
I met with pt's wife outside of the room to review goals and expectations of an inpt rehab admission. I await functional readiness for rehab before pursuing approval with [employer] of [place]. Likely next week. Remains on precedex. I will follow.[phone]
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
## 2017-05-15 ENCOUNTER — Inpatient Hospital Stay (HOSPITAL_COMMUNITY): Payer: BLUE CROSS/BLUE SHIELD

## 2017-05-15 LAB — GLUCOSE, CAPILLARY
GLUCOSE-CAPILLARY: 93 mg/dL (ref 65–99)
GLUCOSE-CAPILLARY: 103 mg/dL — AB (ref 65–99)
GLUCOSE-CAPILLARY: 93 mg/dL (ref 65–99)
Glucose-Capillary: 94 mg/dL (ref 65–99)
Glucose-Capillary: 103 mg/dL — ABNORMAL HIGH (ref 65–99)

## 2017-05-15 LAB — TRIGLYCERIDES: Triglycerides: 115 mg/dL (ref <150)

## 2017-05-15 MED ORDER — CLONAZEPAM 0.5 MG PO TABS
0.5000 mg | ORAL_TABLET | Freq: Two times a day (BID) | ORAL | Status: DC
  Administered 2017-05-15 – 2017-05-16 (×4): 0.5 mg via ORAL
  Filled 2017-05-15 (×4): qty 1

## 2017-05-15 MED ORDER — ENSURE ENLIVE PO LIQD
237.0000 mL | Freq: Three times a day (TID) | ORAL | Status: DC
  Administered 2017-05-16 – 2017-05-20 (×3): 237 mL via ORAL

## 2017-05-15 MED ORDER — HYDRALAZINE HCL 20 MG/ML IJ SOLN
10.0000 mg | Freq: Four times a day (QID) | INTRAMUSCULAR | Status: DC | PRN
  Administered 2017-05-15 – 2017-05-18 (×3): 10 mg via INTRAVENOUS
  Filled 2017-05-15 (×3): qty 1

## 2017-05-15 MED ORDER — ACETAMINOPHEN 650 MG RE SUPP
650.0000 mg | RECTAL | Status: DC | PRN
  Administered 2017-05-15 – 2017-05-20 (×4): 650 mg via RECTAL
  Filled 2017-05-15 (×4): qty 1

## 2017-05-15 MED ORDER — METOPROLOL TARTRATE 25 MG PO TABS
12.5000 mg | ORAL_TABLET | Freq: Two times a day (BID) | ORAL | Status: DC
  Administered 2017-05-15 – 2017-05-18 (×7): 12.5 mg via ORAL
  Filled 2017-05-15 (×7): qty 1

## 2017-05-15 MED ORDER — QUETIAPINE FUMARATE 100 MG PO TABS
100.0000 mg | ORAL_TABLET | Freq: Two times a day (BID) | ORAL | Status: DC
  Administered 2017-05-15 – 2017-05-16 (×4): 100 mg via ORAL
  Filled 2017-05-15 (×4): qty 1

## 2017-05-15 MED ORDER — METOPROLOL TARTRATE 25 MG PO TABS: 25.0000 mg | ORAL_TABLET | Freq: Two times a day (BID) | ORAL | Status: DC
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
## 2017-05-16 LAB — BASIC METABOLIC PANEL
ANION GAP: 7 (ref 5–15)
BUN: 8 mg/dL (ref 6–20)
CALCIUM: 8 mg/dL — AB (ref 8.9–10.3)
CO2: 25 mmol/L (ref 22–32)
Chloride: 109 mmol/L (ref 101–111)
Creatinine, Ser: 0.65 mg/dL (ref 0.61–1.24)
Glucose, Bld: 101 mg/dL — ABNORMAL HIGH (ref 65–99)
POTASSIUM: 3 mmol/L — AB (ref 3.5–5.1)
SODIUM: 141 mmol/L (ref 135–145)

## 2017-05-16 LAB — GLUCOSE, CAPILLARY
GLUCOSE-CAPILLARY: 108 mg/dL — AB (ref 65–99)
GLUCOSE-CAPILLARY: 106 mg/dL — AB (ref 65–99)
GLUCOSE-CAPILLARY: 112 mg/dL — AB (ref 65–99)
Glucose-Capillary: 100 mg/dL — ABNORMAL HIGH (ref 65–99)
Glucose-Capillary: 116 mg/dL — ABNORMAL HIGH (ref 65–99)
Glucose-Capillary: 103 mg/dL — ABNORMAL HIGH (ref 65–99)

## 2017-05-16 MED ORDER — POTASSIUM CHLORIDE CRYS ER 20 MEQ PO TBCR
40.0000 meq | EXTENDED_RELEASE_TABLET | Freq: Two times a day (BID) | ORAL | Status: AC
  Administered 2017-05-16 (×2): 40 meq via ORAL
  Filled 2017-05-16 (×2): qty 2
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
## 2017-05-16 NOTE — Progress Notes (Signed)
Overall stable.  Patient will awaken and is intermittently cooperative.  Moving all 4 extremities well and to command.  Status post significant traumatic brain injury.  Continue supportive efforts.  No new recommendations.

## 2017-05-17 LAB — BASIC METABOLIC PANEL
Anion gap: 7 (ref 5–15)
BUN: 8 mg/dL (ref 6–20)
CHLORIDE: 111 mmol/L (ref 101–111)
CO2: 21 mmol/L — AB (ref 22–32)
CREATININE: 0.54 mg/dL — AB (ref 0.61–1.24)
Calcium: 8.4 mg/dL — ABNORMAL LOW (ref 8.9–10.3)
GFR calc Af Amer: >60 mL/min (ref >60)
GFR calc non Af Amer: >60 mL/min (ref >60)
GLUCOSE: 107 mg/dL — AB (ref 65–99)
Potassium: 3.7 mmol/L (ref 3.5–5.1)
Sodium: 139 mmol/L (ref 135–145)

## 2017-05-17 LAB — GLUCOSE, CAPILLARY
GLUCOSE-CAPILLARY: 102 mg/dL — AB (ref 65–99)
GLUCOSE-CAPILLARY: 110 mg/dL — AB (ref 65–99)
Glucose-Capillary: 101 mg/dL — ABNORMAL HIGH (ref 65–99)
Glucose-Capillary: 100 mg/dL — ABNORMAL HIGH (ref 65–99)
Glucose-Capillary: 100 mg/dL — ABNORMAL HIGH (ref 65–99)
Glucose-Capillary: 87 mg/dL (ref 65–99)

## 2017-05-17 MED ORDER — METOPROLOL TARTRATE 5 MG/5ML IV SOLN
5.0000 mg | Freq: Once | INTRAVENOUS | Status: AC
  Administered 2017-05-17: 5 mg via INTRAVENOUS
  Filled 2017-05-17: qty 5

## 2017-05-17 MED ORDER — CLONAZEPAM 1 MG PO TABS
1.0000 mg | ORAL_TABLET | Freq: Two times a day (BID) | ORAL | Status: DC
  Administered 2017-05-17 – 2017-05-18 (×3): 1 mg via ORAL
  Filled 2017-05-17 (×3): qty 1

## 2017-05-17 MED ORDER — QUETIAPINE FUMARATE 100 MG PO TABS
100.0000 mg | ORAL_TABLET | Freq: Three times a day (TID) | ORAL | Status: DC
  Administered 2017-05-17 – 2017-05-20 (×11): 100 mg via ORAL
  Filled 2017-05-17 (×13): qty 1

## 2017-05-17 MED ORDER — CHLORHEXIDINE GLUCONATE 0.12 % MT SOLN
OROMUCOSAL | Status: AC
  Administered 2017-05-17: 15 mL via OROMUCOSAL
  Filled 2017-05-17: qty 15
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
## 2017-05-17 NOTE — Progress Notes (Signed)
Patient remained tachycardiac despite 2mg  ativan. Dr. Janee Mornhompson notified. 5mg  lopressor ordered. Will continue to monitor.

## 2017-05-17 NOTE — Progress Notes (Signed)
Patient started coughing while eating his dinner. He became tachycardiac but did not have any difficulty breathing. Lungs sound rhoncus but the same as earlier in the shift. Will continue to monitor respiratory status. Ripley FraiseHarper, Taeya Theall D

## 2017-05-17 NOTE — Progress Notes (Signed)
Subjective: Patient extubated, but periods of agitation, just given IV Ativan in addition to his Precedex drip. He had been sitting up eating breakfast earlier today.  Objective: Vital signs in last 24 hours: Vitals:   05/17/17 0500 05/17/17 0600 05/17/17 0700 05/17/17 0800  BP: (!) 163/108 (!) 141/91 (!) 147/105 (!) 144/103  Pulse: (!) 125 91 (!) 106 (!) 119  Resp: (!) 21 14 (!) 22 18  Temp:    98.2 F (36.8 C)  TempSrc:    Axillary  SpO2: 100% 100% 100% 100%  Weight:      Height:        Intake/Output from previous day: 12/22 0701 - 12/23 0700 In: 2450.7 [I.V.:2240.7; IV Piggyback:210] Out: 2575 [Urine:2575] Intake/Output this shift: Total I/O In: -  Out: 300 [Urine:300]  Physical Exam:  Currently sedated, but nursing staff notes significantly greater response prior to sedation with Ativan.  BMET Recent Labs    05/16/17 0515 05/17/17 0349  NA 141 139  K 3.0* 3.7  CL 109 111  CO2 25 21*  GLUCOSE 101* 107*  BUN 8 8  CREATININE 0.65 0.54*  CALCIUM 8.0* 8.4*   Assessment/Plan: Continue supportive care.   Hewitt ShortsNUDELMAN,ROBERT W, MD 05/17/2017, 10:12 AM

## 2017-05-18 LAB — GLUCOSE, CAPILLARY
GLUCOSE-CAPILLARY: 104 mg/dL — AB (ref 65–99)
GLUCOSE-CAPILLARY: 111 mg/dL — AB (ref 65–99)
GLUCOSE-CAPILLARY: 115 mg/dL — AB (ref 65–99)
GLUCOSE-CAPILLARY: 92 mg/dL (ref 65–99)
GLUCOSE-CAPILLARY: 95 mg/dL (ref 65–99)
Glucose-Capillary: 106 mg/dL — ABNORMAL HIGH (ref 65–99)

## 2017-05-18 LAB — BASIC METABOLIC PANEL
ANION GAP: 9 (ref 5–15)
BUN: 8 mg/dL (ref 6–20)
CALCIUM: 8.3 mg/dL — AB (ref 8.9–10.3)
CO2: 22 mmol/L (ref 22–32)
Chloride: 109 mmol/L (ref 101–111)
Creatinine, Ser: 0.6 mg/dL — ABNORMAL LOW (ref 0.61–1.24)
Glucose, Bld: 106 mg/dL — ABNORMAL HIGH (ref 65–99)
POTASSIUM: 3.6 mmol/L (ref 3.5–5.1)
Sodium: 140 mmol/L (ref 135–145)

## 2017-05-18 LAB — TRIGLYCERIDES: Triglycerides: 95 mg/dL (ref <150)

## 2017-05-18 MED ORDER — METOPROLOL TARTRATE 5 MG/5ML IV SOLN
10.0000 mg | Freq: Once | INTRAVENOUS | Status: AC
  Administered 2017-05-18: 10 mg via INTRAVENOUS

## 2017-05-18 MED ORDER — METOPROLOL TARTRATE 25 MG PO TABS
25.0000 mg | ORAL_TABLET | Freq: Two times a day (BID) | ORAL | Status: DC
  Administered 2017-05-18 – 2017-05-19 (×2): 25 mg via ORAL
  Filled 2017-05-18 (×2): qty 1

## 2017-05-18 MED ORDER — CLONAZEPAM 1 MG PO TABS
2.0000 mg | ORAL_TABLET | Freq: Two times a day (BID) | ORAL | Status: DC
  Administered 2017-05-18 – 2017-05-20 (×5): 2 mg via ORAL
  Filled 2017-05-18 (×5): qty 2

## 2017-05-18 MED ORDER — METOPROLOL TARTRATE 5 MG/5ML IV SOLN
INTRAVENOUS | Status: AC
  Administered 2017-05-18: 10 mg via INTRAVENOUS
  Filled 2017-05-18: qty 10

## 2017-05-18 NOTE — Progress Notes (Signed)
I met with wife at bedside. Pt with precedex restarted. We will follow up on Wednesday. Will not begin [employer] authorization for a possible inpt rehab admit until off precedex. [phone]
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
## 2017-05-18 NOTE — Progress Notes (Signed)
Pt sedation turned of per therapy, Pt HR 120s, BP 140s/100s at that time, PRN meds given. Pt HR increased 160s sustained during therapy BP 130/67.   0809 Pt HR 140s sus currently assisting to eat while working with speech. Precedex gtt currently off.  Nursing will cont to monitor.
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
## 2017-05-19 ENCOUNTER — Encounter (HOSPITAL_COMMUNITY): Payer: Self-pay

## 2017-05-19 LAB — GLUCOSE, CAPILLARY
GLUCOSE-CAPILLARY: 121 mg/dL — AB (ref 65–99)
GLUCOSE-CAPILLARY: 112 mg/dL — AB (ref 65–99)
Glucose-Capillary: 107 mg/dL — ABNORMAL HIGH (ref 65–99)
Glucose-Capillary: 112 mg/dL — ABNORMAL HIGH (ref 65–99)
Glucose-Capillary: 126 mg/dL — ABNORMAL HIGH (ref 65–99)
Glucose-Capillary: 95 mg/dL (ref 65–99)

## 2017-05-19 MED ORDER — METOPROLOL TARTRATE 5 MG/5ML IV SOLN
10.0000 mg | Freq: Four times a day (QID) | INTRAVENOUS | Status: DC | PRN
  Administered 2017-05-19 – 2017-05-27 (×14): 10 mg via INTRAVENOUS
  Filled 2017-05-19 (×16): qty 10

## 2017-05-19 MED ORDER — METOPROLOL TARTRATE 50 MG PO TABS
50.0000 mg | ORAL_TABLET | Freq: Two times a day (BID) | ORAL | Status: DC
  Administered 2017-05-19 – 2017-05-20 (×3): 50 mg via ORAL
  Filled 2017-05-19 (×3): qty 1

## 2017-05-19 NOTE — Plan of Care (Signed)
Pt on dysphagia I diet nectar thick, needs assistance with eating
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
## 2017-05-19 NOTE — Progress Notes (Signed)
Pt noted to have HR in 140's. MD made aware.  Melford AaseSusan J Javen Hinderliter RN

## 2017-05-19 NOTE — Progress Notes (Signed)
Patient ID: Anthony Singleton Anthony Singleton, male   DOB: [birth year], 46 y.o.   MRN: [id number] Resting quietly on Precedex, we are following

## 2017-05-20 LAB — BASIC METABOLIC PANEL
Anion gap: 10 (ref 5–15)
BUN: 11 mg/dL (ref 6–20)
CHLORIDE: 109 mmol/L (ref 101–111)
CO2: 20 mmol/L — AB (ref 22–32)
CREATININE: 0.7 mg/dL (ref 0.61–1.24)
Calcium: 8.3 mg/dL — ABNORMAL LOW (ref 8.9–10.3)
GFR calc Af Amer: >60 mL/min (ref >60)
GFR calc non Af Amer: >60 mL/min (ref >60)
GLUCOSE: 110 mg/dL — AB (ref 65–99)
Potassium: 4.3 mmol/L (ref 3.5–5.1)
Sodium: 139 mmol/L (ref 135–145)

## 2017-05-20 LAB — GLUCOSE, CAPILLARY
GLUCOSE-CAPILLARY: 112 mg/dL — AB (ref 65–99)
Glucose-Capillary: 113 mg/dL — ABNORMAL HIGH (ref 65–99)
Glucose-Capillary: 108 mg/dL — ABNORMAL HIGH (ref 65–99)
Glucose-Capillary: 110 mg/dL — ABNORMAL HIGH (ref 65–99)
Glucose-Capillary: 104 mg/dL — ABNORMAL HIGH (ref 65–99)

## 2017-05-20 MED ORDER — ACETAMINOPHEN 325 MG PO TABS
650.0000 mg | ORAL_TABLET | Freq: Four times a day (QID) | ORAL | Status: DC | PRN
  Administered 2017-05-20: 650 mg via ORAL
  Filled 2017-05-20: qty 2

## 2017-05-20 MED ORDER — METOPROLOL TARTRATE 5 MG/5ML IV SOLN
INTRAVENOUS | Status: AC
  Administered 2017-05-20: 5 mg via INTRAVENOUS
  Filled 2017-05-20: qty 5

## 2017-05-20 MED ORDER — METOPROLOL TARTRATE 5 MG/5ML IV SOLN
5.0000 mg | Freq: Once | INTRAVENOUS | Status: AC
  Administered 2017-05-20: 5 mg via INTRAVENOUS

## 2017-05-20 MED ORDER — CLONIDINE HCL 0.2 MG/24HR TD PTWK
0.2000 mg | MEDICATED_PATCH | TRANSDERMAL | Status: DC
  Administered 2017-05-20: 0.2 mg via TRANSDERMAL
  Filled 2017-05-20: qty 1

## 2017-05-20 NOTE — Progress Notes (Signed)
Trauma Service Note  Subjective: Patient doing okay.  Nonverbal but will follow commands.  Objective: Vital signs in last 24 hours: Temp:  [98.7 F (37.1 C)-101.2 F (38.4 C)] 100.6 F (38.1 C) (12/26 1200) Pulse Rate:  [111-147] 138 (12/26 1200) Resp:  [18-34] 24 (12/26 1200) BP: (125-181)/(87-122) 144/100 (12/26 1200) SpO2:  [97 %-100 %] 98 % (12/26 1200) Weight:  [82.8 kg (182 lb 8.7 oz)] 82.8 kg (182 lb 8.7 oz) (12/26 0500) Last BM Date: 05/20/17  Intake/Output from previous day: 12/25 0701 - 12/26 0700 In: 2293.9 [I.V.:2083.9; IV Piggyback:210] Out: 2100 [Urine:2100] Intake/Output this shift: Total I/O In: 398.8 [I.V.:398.8] Out: 175 [Urine:175]  General: No distress.  Nonverbal.   Follows commands.  Lungs: Clear  Abd: Soft, benign, cleared for DI nectar thick .  Not taking much at all.  Extremities: No changes  Neuro: Stares.  Pupils are reactive.  Nonverbal.  Follwos commands.  Lab Results: CBC  No results for input(s): WBC, HGB, HCT, PLT in the last 72 hours. BMET Recent Labs    05/18/17 0333 05/20/17 0307  NA 140 139  K 3.6 4.3  CL 109 109  CO2 22 20*  GLUCOSE 106* 110*  BUN 8 11  CREATININE 0.60* 0.70  CALCIUM 8.3* 8.3*   PT/INR No results for input(s): LABPROT, INR in the last 72 hours. ABG No results for input(s): PHART, HCO3 in the last 72 hours.  Invalid input(s): PCO2, PO2  Studies/Results: No results found.  Anti-infectives: Anti-infectives (From admission, onward)   None      Assessment/Plan: s/p  Transfer to 4NP.  Catapres II for BP control.  LOS: 11 days   Marta LamasJames O. Gae BonWyatt, III, MD, FACS [phone] Trauma Surgeon 05/20/2017

## 2017-05-20 NOTE — Progress Notes (Signed)
Wasted 100mg   SEROQUEL PO in the sharps box. Witnessed by Inez Pilgrimaylar Pridgen, RN.
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
## 2017-05-21 ENCOUNTER — Inpatient Hospital Stay (HOSPITAL_COMMUNITY): Payer: BLUE CROSS/BLUE SHIELD

## 2017-05-21 LAB — CBC
HEMATOCRIT: 35.5 % — AB (ref 39.0–52.0)
HEMOGLOBIN: 12.5 g/dL — AB (ref 13.0–17.0)
MCH: 33.9 pg (ref 26.0–34.0)
MCHC: 35.2 g/dL (ref 30.0–36.0)
MCV: 96.2 fL (ref 78.0–100.0)
Platelets: 229 10*3/uL (ref 150–400)
RBC: 3.69 MIL/uL — ABNORMAL LOW (ref 4.22–5.81)
RDW: 13.5 % (ref 11.5–15.5)
WBC: 14.5 10*3/uL — ABNORMAL HIGH (ref 4.0–10.5)

## 2017-05-21 LAB — GLUCOSE, CAPILLARY
Glucose-Capillary: 104 mg/dL — ABNORMAL HIGH (ref 65–99)
Glucose-Capillary: 122 mg/dL — ABNORMAL HIGH (ref 65–99)
Glucose-Capillary: 128 mg/dL — ABNORMAL HIGH (ref 65–99)

## 2017-05-21 MED ORDER — PRO-STAT SUGAR FREE PO LIQD
30.0000 mL | Freq: Three times a day (TID) | ORAL | Status: DC
  Administered 2017-05-21 – 2017-05-27 (×18): 30 mL
  Filled 2017-05-21 (×18): qty 30

## 2017-05-21 MED ORDER — CLONAZEPAM 1 MG PO TABS
1.0000 mg | ORAL_TABLET | Freq: Two times a day (BID) | ORAL | Status: DC
Start: 2017-05-21 — End: 2017-05-21

## 2017-05-21 MED ORDER — CLONAZEPAM 1 MG PO TABS
1.0000 mg | ORAL_TABLET | Freq: Two times a day (BID) | ORAL | Status: DC
  Administered 2017-05-21 – 2017-05-22 (×3): 1 mg
  Filled 2017-05-21 (×2): qty 1

## 2017-05-21 MED ORDER — DEXTROSE 5 % IV SOLN
1.0000 g | INTRAVENOUS | Status: DC
  Administered 2017-05-21 – 2017-05-28 (×7): 1 g via INTRAVENOUS
  Filled 2017-05-21 (×9): qty 10

## 2017-05-21 MED ORDER — ACETAMINOPHEN 160 MG/5ML PO SOLN: 650.0000 mg | Freq: Four times a day (QID) | ORAL | Status: DC | PRN

## 2017-05-21 MED ORDER — ACETAMINOPHEN 160 MG/5ML PO SOLN
650.0000 mg | ORAL | Status: DC | PRN
  Administered 2017-05-21 – 2017-05-23 (×5): 650 mg
  Filled 2017-05-21 (×5): qty 20.3

## 2017-05-21 MED ORDER — CLONAZEPAM 1 MG PO TABS
1.0000 mg | ORAL_TABLET | Freq: Two times a day (BID) | ORAL | Status: DC
  Filled 2017-05-21: qty 1

## 2017-05-21 MED ORDER — HALOPERIDOL LACTATE 5 MG/ML IJ SOLN: 5.0000 mg | Freq: Four times a day (QID) | INTRAMUSCULAR | Status: DC | PRN

## 2017-05-21 MED ORDER — CLONIDINE HCL 0.2 MG/24HR TD PTWK
0.2000 mg | MEDICATED_PATCH | TRANSDERMAL | Status: DC
  Administered 2017-05-27: 0.2 mg via TRANSDERMAL
  Filled 2017-05-21: qty 1

## 2017-05-21 MED ORDER — CLONIDINE HCL 0.3 MG/24HR TD PTWK: 0.3000 mg | MEDICATED_PATCH | TRANSDERMAL | Status: DC

## 2017-05-21 MED ORDER — JEVITY 1.5 CAL/FIBER PO LIQD
1000.0000 mL | ORAL | Status: DC
  Administered 2017-05-21 – 2017-05-27 (×8): 1000 mL
  Filled 2017-05-21 (×11): qty 1000

## 2017-05-21 MED ORDER — PANTOPRAZOLE SODIUM 40 MG PO PACK
40.0000 mg | PACK | Freq: Every day | ORAL | Status: DC
  Administered 2017-05-21 – 2017-05-23 (×3): 40 mg
  Filled 2017-05-21 (×3): qty 20

## 2017-05-21 MED ORDER — METOPROLOL TARTRATE 5 MG/5ML IV SOLN
5.0000 mg | Freq: Once | INTRAVENOUS | Status: AC
  Administered 2017-05-21: 5 mg via INTRAVENOUS
  Filled 2017-05-21: qty 5

## 2017-05-21 MED ORDER — FREE WATER
100.0000 mL | Freq: Three times a day (TID) | Status: DC
  Administered 2017-05-21 – 2017-05-27 (×18): 100 mL

## 2017-05-21 MED ORDER — PIVOT 1.5 CAL PO LIQD
1000.0000 mL | ORAL | Status: DC
  Administered 2017-05-21: 1000 mL
  Filled 2017-05-21 (×2): qty 1000

## 2017-05-21 MED ORDER — LEVETIRACETAM 100 MG/ML PO SOLN
500.0000 mg | Freq: Two times a day (BID) | ORAL | Status: DC
  Administered 2017-05-21 – 2017-05-27 (×13): 500 mg
  Filled 2017-05-21 (×13): qty 5

## 2017-05-21 MED ORDER — HYDRALAZINE HCL 20 MG/ML IJ SOLN
10.0000 mg | Freq: Four times a day (QID) | INTRAMUSCULAR | Status: DC | PRN
  Administered 2017-05-22: 10 mg via INTRAVENOUS
  Filled 2017-05-21: qty 1

## 2017-05-21 MED ORDER — METOPROLOL TARTRATE 25 MG/10 ML ORAL SUSPENSION
75.0000 mg | Freq: Two times a day (BID) | ORAL | Status: DC
  Administered 2017-05-21 (×2): 75 mg
  Filled 2017-05-21 (×3): qty 30

## 2017-05-21 MED ORDER — QUETIAPINE FUMARATE 100 MG PO TABS
100.0000 mg | ORAL_TABLET | Freq: Three times a day (TID) | ORAL | Status: DC
  Administered 2017-05-21 – 2017-05-27 (×19): 100 mg
  Filled 2017-05-21 (×19): qty 1

## 2017-05-21 NOTE — Progress Notes (Signed)
Cortrak Tube Team Note:  Consult received to place a Cortrak feeding tube.   A 10 F Cortrak tube was placed in the left nare and secured with a nasal bridle at 89 cm. Per the Cortrak monitor reading the tube tip is post pyloric.   No x-ray is required. RN may begin using tube.   If the tube becomes dislodged please keep the tube and contact the Cortrak team at [email or web address] (password [email or web address]) for replacement.  If after hours and replacement cannot be delayed, place a NG tube and confirm placement with an abdominal x-ray.    Betsey Holidayasey Yeimi Debnam MS, RD, LDN Pager #- [phone] After Hours Pager: [phone]

## 2017-05-21 NOTE — Progress Notes (Signed)
PT Cancellation Note  Patient Details Name: Anthony Singleton MRN: [id number] DOB: 06-27-1970   Cancelled Treatment:    Reason Eval/Treat Not Completed: Other (comment).  RN recommended holding PT for today.  Pt is febrile, HR increased and he just got his cortrack with HR control medication put in through the tube.  RN reports he was 140s at rest before meds and is starting to trend down now.  She would prefer he rest.  PT to check back tomorrow.   Thanks,    Rollene Rotundaebecca B. Chandni Gagan, PT, DPT [phone]   05/21/2017, 3:15 PM

## 2017-05-21 NOTE — Progress Notes (Signed)
Rehab admissions - Noted changes in patient in the past 24 hours.  Not medically ready for CIR yet.  My partner will follow up tomorrow for progress.  Call me for questions.  #[phone]
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
## 2017-05-22 ENCOUNTER — Inpatient Hospital Stay (HOSPITAL_COMMUNITY): Payer: BLUE CROSS/BLUE SHIELD

## 2017-05-22 LAB — GLUCOSE, CAPILLARY
Glucose-Capillary: 97 mg/dL (ref 65–99)
Glucose-Capillary: 121 mg/dL — ABNORMAL HIGH (ref 65–99)
Glucose-Capillary: 108 mg/dL — ABNORMAL HIGH (ref 65–99)
Glucose-Capillary: 111 mg/dL — ABNORMAL HIGH (ref 65–99)
Glucose-Capillary: 113 mg/dL — ABNORMAL HIGH (ref 65–99)

## 2017-05-22 LAB — BASIC METABOLIC PANEL
Anion gap: 7 (ref 5–15)
BUN: 12 mg/dL (ref 6–20)
CHLORIDE: 109 mmol/L (ref 101–111)
CO2: 25 mmol/L (ref 22–32)
CREATININE: 0.64 mg/dL (ref 0.61–1.24)
Calcium: 8.2 mg/dL — ABNORMAL LOW (ref 8.9–10.3)
GFR calc Af Amer: >60 mL/min (ref >60)
GFR calc non Af Amer: >60 mL/min (ref >60)
Glucose, Bld: 132 mg/dL — ABNORMAL HIGH (ref 65–99)
Potassium: 3.1 mmol/L — ABNORMAL LOW (ref 3.5–5.1)
SODIUM: 141 mmol/L (ref 135–145)

## 2017-05-22 LAB — TRIGLYCERIDES: Triglycerides: 73 mg/dL (ref <150)

## 2017-05-22 MED ORDER — LOPERAMIDE HCL 1 MG/5ML PO LIQD
2.0000 mg | ORAL | Status: DC | PRN
  Administered 2017-05-22 – 2017-05-23 (×3): 2 mg
  Filled 2017-05-22 (×5): qty 10

## 2017-05-22 MED ORDER — LORAZEPAM 2 MG/ML IJ SOLN
0.5000 mg | INTRAMUSCULAR | Status: DC | PRN
  Administered 2017-05-22 – 2017-05-25 (×5): 0.5 mg via INTRAVENOUS
  Filled 2017-05-22 (×5): qty 1

## 2017-05-22 MED ORDER — POTASSIUM CHLORIDE 20 MEQ PO PACK
40.0000 meq | PACK | Freq: Three times a day (TID) | ORAL | Status: AC
  Administered 2017-05-22 (×3): 40 meq
  Filled 2017-05-22 (×3): qty 2

## 2017-05-22 MED ORDER — DEXAMETHASONE SODIUM PHOSPHATE 4 MG/ML IJ SOLN
4.0000 mg | Freq: Four times a day (QID) | INTRAMUSCULAR | Status: AC
  Administered 2017-05-22 – 2017-05-23 (×6): 4 mg via INTRAVENOUS
  Filled 2017-05-22 (×6): qty 1

## 2017-05-22 MED ORDER — METOPROLOL TARTRATE 25 MG/10 ML ORAL SUSPENSION
100.0000 mg | Freq: Two times a day (BID) | ORAL | Status: DC
  Administered 2017-05-22 – 2017-05-25 (×7): 100 mg
  Filled 2017-05-22 (×7): qty 40
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
## 2017-05-22 NOTE — Progress Notes (Signed)
Notified Dr. Sheliah HatchKinsinger that patient was restless, agitated and has heart rate in the 160's. Informed him that I had just given 10mg  of IV metoprolol and HR was starting to trend down and some tylenol liquid.  Informed him that neurosurgery Dr. Lovell SheehanJenkins had d/c his clonazepam, fentanyl and ativan due to being lethargic. He said that he didn't want to restart anything at this time.
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
## 2017-05-22 NOTE — Progress Notes (Signed)
SLP Cancellation Note  Patient Details Name: Anthony Singleton MRN: [id number] DOB: November 11, 1970   Cancelled treatment:       Reason Eval/Treat Not Completed: Fatigue/lethargy limiting ability to participate. Pt not alert for PO. Discussed with PT. Will follow for improved arousal   Kerianna Rawlinson, Riley NearingBonnie Caroline 05/22/2017, 11:48 AM

## 2017-05-22 NOTE — Progress Notes (Signed)
Noted continued medical issues. I continue to follow and will see pt again next week. [phone]

## 2017-05-22 NOTE — Progress Notes (Signed)
Contacted the on call neurosurgeon Dr. Wynetta Emeryram about patient's agitation, restlessness, tachycardia since neurosurgeon Dr. Lovell SheehanJenkins had d/c his ativan,fentanyl and clonazepam. He said we could restart the ativan back at 0.5mg  every 2-3 hours prn
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
## 2017-05-22 NOTE — Progress Notes (Signed)
OT Cancellation Note  Patient Details Name: Anthony Singleton Dwayne Allum MRN: [id number] DOB: 04-28-1971   Cancelled Treatment:    Reason Eval/Treat Not Completed: Medical issues which prohibited therapy.  Decreased arousal. CT of head shows increased edema and mass effect.  Will reattempt.  Chrishelle Zito Mabelonarpe, OTR/L [phone]   Jeani HawkingConarpe, Jarret Torre M 05/22/2017, 2:06 PM
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
## 2017-05-23 LAB — CBC
HCT: 35.1 % — ABNORMAL LOW (ref 39.0–52.0)
Hemoglobin: 11.3 g/dL — ABNORMAL LOW (ref 13.0–17.0)
MCH: 31.4 pg (ref 26.0–34.0)
MCHC: 32.2 g/dL (ref 30.0–36.0)
MCV: 97.5 fL (ref 78.0–100.0)
Platelets: 429 10*3/uL — ABNORMAL HIGH (ref 150–400)
RBC: 3.6 MIL/uL — ABNORMAL LOW (ref 4.22–5.81)
RDW: 13.4 % (ref 11.5–15.5)
WBC: 10.2 10*3/uL (ref 4.0–10.5)

## 2017-05-23 LAB — BASIC METABOLIC PANEL
Anion gap: 8 (ref 5–15)
BUN: 11 mg/dL (ref 6–20)
CALCIUM: 8.8 mg/dL — AB (ref 8.9–10.3)
CO2: 24 mmol/L (ref 22–32)
Chloride: 109 mmol/L (ref 101–111)
Creatinine, Ser: 0.54 mg/dL — ABNORMAL LOW (ref 0.61–1.24)
GFR calc Af Amer: >60 mL/min (ref >60)
GLUCOSE: 130 mg/dL — AB (ref 65–99)
Potassium: 3.9 mmol/L (ref 3.5–5.1)
Sodium: 141 mmol/L (ref 135–145)

## 2017-05-23 LAB — GLUCOSE, CAPILLARY
GLUCOSE-CAPILLARY: 120 mg/dL — AB (ref 65–99)
GLUCOSE-CAPILLARY: 138 mg/dL — AB (ref 65–99)
Glucose-Capillary: 110 mg/dL — ABNORMAL HIGH (ref 65–99)
Glucose-Capillary: 120 mg/dL — ABNORMAL HIGH (ref 65–99)
Glucose-Capillary: 121 mg/dL — ABNORMAL HIGH (ref 65–99)
Glucose-Capillary: 130 mg/dL — ABNORMAL HIGH (ref 65–99)
Glucose-Capillary: 131 mg/dL — ABNORMAL HIGH (ref 65–99)

## 2017-05-23 MED ORDER — DEXAMETHASONE SODIUM PHOSPHATE 10 MG/ML IJ SOLN
INTRAMUSCULAR | Status: AC
  Filled 2017-05-23: qty 1

## 2017-05-23 MED ORDER — PANTOPRAZOLE SODIUM 40 MG PO PACK
40.0000 mg | PACK | Freq: Two times a day (BID) | ORAL | Status: DC
  Administered 2017-05-23 – 2017-05-27 (×8): 40 mg
  Filled 2017-05-23 (×8): qty 20

## 2017-05-23 NOTE — Progress Notes (Signed)
Pt seen and examined. No issues overnight.  EXAM: Temp:  [97.5 F (36.4 C)-99.5 F (37.5 C)] 98.4 F (36.9 C) (12/29 0821) Pulse Rate:  [110-130] 125 (12/29 0821) Resp:  [15-25] 16 (12/29 0821) BP: (108-138)/(77-106) 138/97 (12/29 0821) SpO2:  [96 %-97 %] 96 % (12/29 0821) Weight:  [84 kg (185 lb 3 oz)] 84 kg (185 lb 3 oz) (12/29 0400) Intake/Output      12/28 0701 - 12/29 0700 12/29 0701 - 12/30 0700   P.O.     I.V. (mL/kg) 1498.8 (17.8) 675 (8)   NG/GT 2350 120   IV Piggyback 50    Total Intake(mL/kg) 3898.8 (46.4) 795 (9.5)   Urine (mL/kg/hr) 2950 (1.5)    Stool 0    Total Output 2950    Net +948.8 +795        Urine Occurrence 1 x    Stool Occurrence 2 x     Awake, mumbling Follows commands by opening and closing mouth  Stable/improving Continue current care
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
## 2017-05-23 NOTE — Progress Notes (Signed)
SLP Cancellation Note  Patient Details Name: Anthony Singleton MRN: [id number] DOB: 06-08-1970   Cancelled treatment:       Reason Eval/Treat Not Completed: Other (comment);Fatigue/lethargy limiting ability to participate. Pt now with cortrak. Will continue efforts to facilitate arousal, PO readiness.  Anthony BatonMary Beth Windel Singleton, [place], CCC-SLP Speech-Language Pathologist [phone]   Anthony LindauMary E Nikki Singleton 05/23/2017, 6:07 PM

## 2017-05-23 NOTE — Progress Notes (Signed)
SLP Cancellation Note  Patient Details Name: Anthony Singleton MRN: [id number] DOB: 05-Jun-1970   Cancelled treatment:       Reason Eval/Treat Not Completed: Other (comment). Second attempt to see pt today; more alert, however pt needing to be cleaned up. Spoke with wife, will reattempt this afternoon.   Rondel BatonMary Beth Anadalay Macdonell, [place], CCC-SLP Speech-Language Pathologist [phone]   Arlana LindauMary E Walden Statz 05/23/2017, 12:43 PM

## 2017-05-24 LAB — CBC WITH DIFFERENTIAL/PLATELET
BASOS ABS: 0 10*3/uL (ref 0.0–0.1)
BASOS PCT: 0 %
Eosinophils Absolute: 0 10*3/uL (ref 0.0–0.7)
Eosinophils Relative: 0 %
HEMATOCRIT: 33.4 % — AB (ref 39.0–52.0)
HEMOGLOBIN: 10.9 g/dL — AB (ref 13.0–17.0)
Lymphocytes Relative: 8 %
Lymphs Abs: 0.6 10*3/uL — ABNORMAL LOW (ref 0.7–4.0)
MCH: 32.5 pg (ref 26.0–34.0)
MCHC: 32.6 g/dL (ref 30.0–36.0)
MCV: 99.7 fL (ref 78.0–100.0)
Monocytes Absolute: 0.2 10*3/uL (ref 0.1–1.0)
Monocytes Relative: 3 %
NEUTROS ABS: 6.5 10*3/uL (ref 1.7–7.7)
NEUTROS PCT: 89 %
Platelets: 389 10*3/uL (ref 150–400)
RBC: 3.35 MIL/uL — ABNORMAL LOW (ref 4.22–5.81)
RDW: 13.7 % (ref 11.5–15.5)
WBC: 7.4 10*3/uL (ref 4.0–10.5)

## 2017-05-24 LAB — BASIC METABOLIC PANEL
ANION GAP: 8 (ref 5–15)
BUN: 17 mg/dL (ref 6–20)
CALCIUM: 8.2 mg/dL — AB (ref 8.9–10.3)
CHLORIDE: 108 mmol/L (ref 101–111)
CO2: 23 mmol/L (ref 22–32)
Creatinine, Ser: 0.53 mg/dL — ABNORMAL LOW (ref 0.61–1.24)
GFR calc non Af Amer: >60 mL/min (ref >60)
Glucose, Bld: 126 mg/dL — ABNORMAL HIGH (ref 65–99)
Potassium: 4.1 mmol/L (ref 3.5–5.1)
Sodium: 139 mmol/L (ref 135–145)

## 2017-05-24 LAB — GLUCOSE, CAPILLARY
GLUCOSE-CAPILLARY: 120 mg/dL — AB (ref 65–99)
GLUCOSE-CAPILLARY: 109 mg/dL — AB (ref 65–99)
Glucose-Capillary: 104 mg/dL — ABNORMAL HIGH (ref 65–99)
Glucose-Capillary: 118 mg/dL — ABNORMAL HIGH (ref 65–99)

## 2017-05-24 LAB — TRIGLYCERIDES: TRIGLYCERIDES: 124 mg/dL (ref <150)
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
## 2017-05-24 NOTE — Progress Notes (Signed)
Trauma Service Note  Subjective: Patient very somnolent for me today, but did a bit more for the neurosurgeon today.  Objective: Vital signs in last 24 hours: Temp:  [98.1 F (36.7 C)-98.6 F (37 C)] 98.4 F (36.9 C) (12/30 0800) Pulse Rate:  [95-128] 95 (12/30 0800) Resp:  [13-18] 13 (12/30 0800) BP: (101-147)/(68-93) 130/89 (12/30 0800) SpO2:  [97 %-99 %] 99 % (12/30 0800) Weight:  [82.9 kg (182 lb 12.2 oz)] 82.9 kg (182 lb 12.2 oz) (12/30 0500) Last BM Date: 05/23/17  Intake/Output from previous day: 12/29 0701 - 12/30 0700 In: 4130 [I.V.:2250; NG/GT:1830; IV Piggyback:50] Out: 2350 [Urine:2350] Intake/Output this shift: Total I/O In: 180 [NG/GT:180] Out: -   General: No distress--sleepy  Lungs: Clear  Abd: Benign.  Tolerating tube feedings well.  Extremities: No changes  Neuro: Somnolent.  Pupils are okay.  Not responsive to me today.  Lab Results: CBC  Recent Labs    05/23/17 0824 05/24/17 0202  WBC 10.2 7.4  HGB 11.3* 10.9*  HCT 35.1* 33.4*  PLT 429* 389   BMET Recent Labs    05/23/17 0824 05/24/17 0202  NA 141 139  K 3.9 4.1  CL 109 108  CO2 24 23  GLUCOSE 130* 126*  BUN 11 17  CREATININE 0.54* 0.53*  CALCIUM 8.8* 8.2*   PT/INR No results for input(s): LABPROT, INR in the last 72 hours. ABG No results for input(s): PHART, HCO3 in the last 72 hours.  Invalid input(s): PCO2, PO2  Studies/Results: No results found.  Anti-infectives: Anti-infectives (From admission, onward)   Start     Dose/Rate Route Frequency Ordered Stop   05/21/17 1300  cefTRIAXone (ROCEPHIN) 1 g in dextrose 5 % 50 mL IVPB     1 g 100 mL/hr over 30 Minutes Intravenous Every 24 hours 05/21/17 1222        Assessment/Plan: s/p  Possible Rehab soon.  Empiric treatment for possible pneumonia  LOS: 15 days   Marta LamasJames O. Gae BonWyatt, III, MD, FACS [phone] Trauma Surgeon 05/24/2017

## 2017-05-25 LAB — GLUCOSE, CAPILLARY
GLUCOSE-CAPILLARY: 114 mg/dL — AB (ref 65–99)
GLUCOSE-CAPILLARY: 102 mg/dL — AB (ref 65–99)
GLUCOSE-CAPILLARY: 111 mg/dL — AB (ref 65–99)
GLUCOSE-CAPILLARY: 111 mg/dL — AB (ref 65–99)
GLUCOSE-CAPILLARY: 107 mg/dL — AB (ref 65–99)
Glucose-Capillary: 142 mg/dL — ABNORMAL HIGH (ref 65–99)

## 2017-05-25 MED ORDER — METOPROLOL TARTRATE 25 MG/10 ML ORAL SUSPENSION
125.0000 mg | Freq: Two times a day (BID) | ORAL | Status: DC
  Administered 2017-05-25 – 2017-05-27 (×4): 125 mg
  Filled 2017-05-25 (×5): qty 60
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
## 2017-05-25 NOTE — Clinical Social Work Note (Signed)
Clinical Social Worker unable to complete SBIRT with patient at this time due to current mental status.  Patient is being evaluated for possible CIR admission.  CSW will follow along to determine patient discharge plans and if mental status clears will complete SBIRT.  Macario GoldsJesse Anyelo Mccue, [place] [phone]
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
## 2017-05-26 LAB — GLUCOSE, CAPILLARY
GLUCOSE-CAPILLARY: 107 mg/dL — AB (ref 65–99)
GLUCOSE-CAPILLARY: 109 mg/dL — AB (ref 65–99)
GLUCOSE-CAPILLARY: 146 mg/dL — AB (ref 65–99)
Glucose-Capillary: 117 mg/dL — ABNORMAL HIGH (ref 65–99)
Glucose-Capillary: 89 mg/dL (ref 65–99)

## 2017-05-26 MED ORDER — POLYETHYLENE GLYCOL 3350 17 G PO PACK
17.0000 g | PACK | Freq: Every day | ORAL | Status: DC
  Administered 2017-05-26 – 2017-05-29 (×3): 17 g via ORAL
  Filled 2017-05-26 (×4): qty 1
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
## 2017-05-26 NOTE — Progress Notes (Signed)
No acute events Awake, alert Conversant Follows in all extremities Improving Continue supportive care

## 2017-05-27 ENCOUNTER — Inpatient Hospital Stay (HOSPITAL_COMMUNITY): Payer: BLUE CROSS/BLUE SHIELD

## 2017-05-27 LAB — GLUCOSE, CAPILLARY
GLUCOSE-CAPILLARY: 102 mg/dL — AB (ref 65–99)
Glucose-Capillary: 103 mg/dL — ABNORMAL HIGH (ref 65–99)
Glucose-Capillary: 114 mg/dL — ABNORMAL HIGH (ref 65–99)
Glucose-Capillary: 137 mg/dL — ABNORMAL HIGH (ref 65–99)
Glucose-Capillary: 92 mg/dL (ref 65–99)
Glucose-Capillary: 95 mg/dL (ref 65–99)

## 2017-05-27 MED ORDER — METOPROLOL TARTRATE 25 MG/10 ML ORAL SUSPENSION
125.0000 mg | Freq: Two times a day (BID) | ORAL | Status: DC
  Filled 2017-05-27: qty 60

## 2017-05-27 MED ORDER — SODIUM CHLORIDE 0.9 % IV SOLN
25.0000 mg | Freq: Four times a day (QID) | INTRAVENOUS | Status: DC | PRN
  Administered 2017-05-27: 25 mg via INTRAVENOUS
  Filled 2017-05-27 (×2): qty 1

## 2017-05-27 MED ORDER — METOPROLOL TARTRATE 12.5 MG HALF TABLET
12.5000 mg | ORAL_TABLET | Freq: Two times a day (BID) | ORAL | Status: DC
  Administered 2017-05-27 – 2017-05-28 (×2): 12.5 mg via ORAL
  Filled 2017-05-27 (×2): qty 1

## 2017-05-27 MED ORDER — PANTOPRAZOLE SODIUM 40 MG PO TBEC
40.0000 mg | DELAYED_RELEASE_TABLET | Freq: Two times a day (BID) | ORAL | Status: DC
  Administered 2017-05-27 – 2017-05-29 (×4): 40 mg via ORAL
  Filled 2017-05-27 (×4): qty 1

## 2017-05-27 MED ORDER — LEVETIRACETAM 500 MG PO TABS
500.0000 mg | ORAL_TABLET | Freq: Two times a day (BID) | ORAL | Status: DC
Start: 2017-05-27 — End: 2017-05-29
  Administered 2017-05-27 – 2017-05-29 (×4): 500 mg via ORAL
  Filled 2017-05-27 (×4): qty 1

## 2017-05-27 MED ORDER — ACETAMINOPHEN 160 MG/5ML PO SOLN: 650.0000 mg | ORAL | Status: DC | PRN

## 2017-05-27 MED ORDER — ACETAMINOPHEN 650 MG RE SUPP: 650.0000 mg | RECTAL | Status: DC | PRN

## 2017-05-27 MED ORDER — LEVETIRACETAM 100 MG/ML PO SOLN: 500.0000 mg | Freq: Two times a day (BID) | ORAL | Status: DC

## 2017-05-27 MED ORDER — PANTOPRAZOLE SODIUM 40 MG PO PACK: 40.0000 mg | PACK | Freq: Two times a day (BID) | ORAL | Status: DC

## 2017-05-27 MED ORDER — RESOURCE THICKENUP CLEAR PO POWD
ORAL | Status: DC | PRN
  Filled 2017-05-27: qty 125

## 2017-05-27 MED ORDER — QUETIAPINE FUMARATE 100 MG PO TABS
100.0000 mg | ORAL_TABLET | Freq: Three times a day (TID) | ORAL | Status: DC
Start: 2017-05-27 — End: 2017-05-29
  Administered 2017-05-27 – 2017-05-29 (×5): 100 mg via ORAL
  Filled 2017-05-27 (×5): qty 1

## 2017-05-27 MED ORDER — SENNOSIDES 8.8 MG/5ML PO SYRP
5.0000 mL | ORAL_SOLUTION | Freq: Two times a day (BID) | ORAL | Status: DC | PRN
  Administered 2017-05-29: 5 mL via ORAL
  Filled 2017-05-27: qty 5

## 2017-05-27 NOTE — Progress Notes (Signed)
Modified Barium Swallow Progress Note  Patient Details  Name: Merry Prouderry Dwayne Schwan MRN: [id number] Date of Birth: 03-29-1971  Today's Date: 05/27/2017  Modified Barium Swallow completed.  Full report located under Chart Review in the Imaging Section.  Brief recommendations include the following:  Clinical Impression  Pt demonstrates adequate oral function, pharyngeal strength and sensation. Primary impairments impacting swallow include impulsivity with intake, taking large boluses rapidly and piecemealing large bolus. This, combinned with slightly late laryngeal closure during the swallow leads to sensed penetration of small boluses with ejection and sensed aspiration of large boluses with hard, explosive cough. When SLP controlled sip size of thin liquids, pt tolerated well. Pts NG tube may be playing a role in impeding laryngeal closure. For now, recommend a  dys 3 (mechanical soft) diet with nectar thick liquids. Trials of thin liquids at bedside after NG tube is out will determine readiness for upgrade as sensation is quite consistent. A breath hold or supraglottic swallow may be helpful if problem persists into the future as cognition continues to improve.    Swallow Evaluation Recommendations       SLP Diet Recommendations: Dysphagia 3 (Mech soft) solids;Nectar thick liquid   Liquid Administration via: Cup   Medication Administration: Whole meds with liquid   Supervision: Staff to assist with self feeding;Full supervision/cueing for compensatory strategies   Compensations: Slow rate;Small sips/bites;Minimize environmental distractions   Postural Changes: Seated upright at 90 degrees   Oral Care Recommendations: Oral care BID   Other Recommendations: Order thickener from pharmacy    Trebor Galdamez, Riley NearingBonnie Caroline 05/27/2017,10:20 AM
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
## 2017-05-27 NOTE — Progress Notes (Signed)
Rehab admissions - I have opened the case with [employer] and have requested acute inpatient rehab admission.  I will follow up once I hear back from insurance carrier.  Call me for questions.  #[phone]
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
## 2017-05-28 ENCOUNTER — Inpatient Hospital Stay (HOSPITAL_COMMUNITY): Payer: BLUE CROSS/BLUE SHIELD

## 2017-05-28 DIAGNOSIS — R000 Tachycardia, unspecified: Secondary | ICD-10-CM

## 2017-05-28 LAB — GLUCOSE, CAPILLARY
GLUCOSE-CAPILLARY: 97 mg/dL (ref 65–99)
Glucose-Capillary: 104 mg/dL — ABNORMAL HIGH (ref 65–99)
Glucose-Capillary: 90 mg/dL (ref 65–99)

## 2017-05-28 MED ORDER — METOPROLOL TARTRATE 25 MG PO TABS: 25.0000 mg | ORAL_TABLET | Freq: Two times a day (BID) | ORAL | Status: DC

## 2017-05-28 MED ORDER — METOPROLOL TARTRATE 25 MG PO TABS
87.5000 mg | ORAL_TABLET | Freq: Once | ORAL | Status: AC
  Administered 2017-05-28: 87.5 mg via ORAL
  Filled 2017-05-28: qty 1

## 2017-05-28 MED ORDER — METOPROLOL TARTRATE 50 MG PO TABS
100.0000 mg | ORAL_TABLET | Freq: Two times a day (BID) | ORAL | Status: DC
  Administered 2017-05-28 – 2017-05-29 (×2): 100 mg via ORAL
  Filled 2017-05-28 (×2): qty 2

## 2017-05-28 MED ORDER — IOPAMIDOL (ISOVUE-370) INJECTION 76%
INTRAVENOUS | Status: AC
  Administered 2017-05-28: 100 mL
  Filled 2017-05-28: qty 100

## 2017-05-28 MED ORDER — ENSURE ENLIVE PO LIQD
237.0000 mL | Freq: Two times a day (BID) | ORAL | Status: DC
  Administered 2017-05-29: 237 mL via ORAL

## 2017-05-28 MED ORDER — SODIUM CHLORIDE 0.9 % IV SOLN
INTRAVENOUS | Status: AC
  Administered 2017-05-28: 17:00:00 via INTRAVENOUS

## 2017-05-28 NOTE — Progress Notes (Addendum)
I met with Anthony Singleton and his wife at bedside. Anthony Singleton with heart rate 130 at rest. I contacted Dr. Hulen Skains to discuss. He will ask cardiology to consult before plans for admit to inpt rehab today. Insurance has approved an inpt rehab admission. I will follow up today. [phone]
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
## 2017-05-28 NOTE — Progress Notes (Addendum)
New IV attempted for CTA with help of second RN, appears to be in valve, consulting IV team for placement and new IV if needed. Updated Kim in CT in regards to pt care. 1830 CT staff updated, IV good to use per IV team.

## 2017-05-28 NOTE — Progress Notes (Signed)
Cardiology consult in process. I will follow up tomorrow for possible admit once cleared by cardiology and medical workup complete. Wife at bedside and aware of plans. [phone]
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
## 2017-05-29 ENCOUNTER — Encounter (HOSPITAL_COMMUNITY): Payer: Self-pay | Admitting: Physical Medicine and Rehabilitation

## 2017-05-29 ENCOUNTER — Encounter (HOSPITAL_COMMUNITY): Payer: Self-pay | Admitting: *Deleted

## 2017-05-29 ENCOUNTER — Inpatient Hospital Stay (HOSPITAL_COMMUNITY): Payer: BLUE CROSS/BLUE SHIELD

## 2017-05-29 ENCOUNTER — Inpatient Hospital Stay (HOSPITAL_COMMUNITY)
Admission: RE | Admit: 2017-05-29 | Discharge: 2017-06-13 | DRG: 560 | Disposition: A | Discharge status: Home / Self Care | Payer: BLUE CROSS/BLUE SHIELD | Source: Intra-hospital | Attending: Physical Medicine & Rehabilitation | Admitting: Physical Medicine & Rehabilitation

## 2017-05-29 DIAGNOSIS — S020XXD Fracture of vault of skull, subsequent encounter for fracture with routine healing: Principal | ICD-10-CM

## 2017-05-29 DIAGNOSIS — D62 Acute posthemorrhagic anemia: Secondary | ICD-10-CM | POA: Diagnosis present

## 2017-05-29 DIAGNOSIS — R1310 Dysphagia, unspecified: Secondary | ICD-10-CM | POA: Diagnosis present

## 2017-05-29 DIAGNOSIS — H9319 Tinnitus, unspecified ear: Secondary | ICD-10-CM | POA: Diagnosis present

## 2017-05-29 DIAGNOSIS — R451 Restlessness and agitation: Secondary | ICD-10-CM | POA: Diagnosis not present

## 2017-05-29 DIAGNOSIS — R000 Tachycardia, unspecified: Secondary | ICD-10-CM

## 2017-05-29 DIAGNOSIS — M25511 Pain in right shoulder: Secondary | ICD-10-CM | POA: Diagnosis not present

## 2017-05-29 DIAGNOSIS — R4587 Impulsiveness: Secondary | ICD-10-CM | POA: Diagnosis present

## 2017-05-29 DIAGNOSIS — S062X9A Diffuse traumatic brain injury with loss of consciousness of unspecified duration, initial encounter: Secondary | ICD-10-CM | POA: Diagnosis not present

## 2017-05-29 DIAGNOSIS — R569 Unspecified convulsions: Secondary | ICD-10-CM | POA: Diagnosis not present

## 2017-05-29 DIAGNOSIS — G4700 Insomnia, unspecified: Secondary | ICD-10-CM | POA: Diagnosis present

## 2017-05-29 DIAGNOSIS — G478 Other sleep disorders: Secondary | ICD-10-CM | POA: Diagnosis not present

## 2017-05-29 DIAGNOSIS — R52 Pain, unspecified: Secondary | ICD-10-CM

## 2017-05-29 DIAGNOSIS — Z87820 Personal history of traumatic brain injury: Secondary | ICD-10-CM

## 2017-05-29 DIAGNOSIS — S069X9S Unspecified intracranial injury with loss of consciousness of unspecified duration, sequela: Secondary | ICD-10-CM | POA: Diagnosis not present

## 2017-05-29 DIAGNOSIS — H9192 Unspecified hearing loss, left ear: Secondary | ICD-10-CM | POA: Diagnosis present

## 2017-05-29 DIAGNOSIS — I10 Essential (primary) hypertension: Secondary | ICD-10-CM

## 2017-05-29 DIAGNOSIS — E8809 Other disorders of plasma-protein metabolism, not elsewhere classified: Secondary | ICD-10-CM | POA: Diagnosis present

## 2017-05-29 DIAGNOSIS — F1910 Other psychoactive substance abuse, uncomplicated: Secondary | ICD-10-CM | POA: Diagnosis present

## 2017-05-29 LAB — ECHOCARDIOGRAM COMPLETE
Height: 77 in
Weight: 2825.42 oz

## 2017-05-29 LAB — GLUCOSE, CAPILLARY: Glucose-Capillary: 94 mg/dL (ref 65–99)

## 2017-05-29 MED ORDER — GUAIFENESIN-DM 100-10 MG/5ML PO SYRP: 5.0000 mL | ORAL_SOLUTION | Freq: Four times a day (QID) | ORAL | Status: DC | PRN

## 2017-05-29 MED ORDER — PROCHLORPERAZINE MALEATE 5 MG PO TABS: 5.0000 mg | ORAL_TABLET | Freq: Four times a day (QID) | ORAL | Status: DC | PRN

## 2017-05-29 MED ORDER — PROCHLORPERAZINE EDISYLATE 5 MG/ML IJ SOLN: 5.0000 mg | Freq: Four times a day (QID) | INTRAMUSCULAR | Status: DC | PRN

## 2017-05-29 MED ORDER — ALUM & MAG HYDROXIDE-SIMETH 200-200-20 MG/5ML PO SUSP: 30.0000 mL | ORAL | Status: DC | PRN

## 2017-05-29 MED ORDER — CLONIDINE HCL 0.2 MG/24HR TD PTWK
0.2000 mg | MEDICATED_PATCH | TRANSDERMAL | Status: DC
  Administered 2017-06-03: 0.2 mg via TRANSDERMAL
  Filled 2017-05-29: qty 1

## 2017-05-29 MED ORDER — OXYCODONE HCL 5 MG PO TABS
5.0000 mg | ORAL_TABLET | ORAL | Status: DC | PRN
  Administered 2017-05-31 (×3): 5 mg via ORAL
  Administered 2017-05-31: 10 mg via ORAL
  Administered 2017-06-01: 5 mg via ORAL
  Administered 2017-06-02 – 2017-06-05 (×5): 10 mg via ORAL
  Administered 2017-06-05: 5 mg via ORAL
  Administered 2017-06-05 – 2017-06-08 (×6): 10 mg via ORAL
  Administered 2017-06-08: 5 mg via ORAL
  Administered 2017-06-08 – 2017-06-13 (×13): 10 mg via ORAL
  Filled 2017-05-29 (×6): qty 2
  Filled 2017-05-29 (×2): qty 1
  Filled 2017-05-29: qty 2
  Filled 2017-05-29: qty 1
  Filled 2017-05-29: qty 2
  Filled 2017-05-29: qty 1
  Filled 2017-05-29 (×9): qty 2
  Filled 2017-05-29: qty 1
  Filled 2017-05-29 (×2): qty 2
  Filled 2017-05-29: qty 1
  Filled 2017-05-29 (×2): qty 2
  Filled 2017-05-29: qty 1
  Filled 2017-05-29: qty 2
  Filled 2017-05-29: qty 1
  Filled 2017-05-29 (×2): qty 2

## 2017-05-29 MED ORDER — ONDANSETRON 4 MG PO TBDP
4.0000 mg | ORAL_TABLET | Freq: Four times a day (QID) | ORAL | Status: DC | PRN
  Filled 2017-05-29: qty 1

## 2017-05-29 MED ORDER — POLYETHYLENE GLYCOL 3350 17 G PO PACK
17.0000 g | PACK | Freq: Every day | ORAL | Status: DC | PRN
Start: 2017-05-29 — End: 2017-06-13

## 2017-05-29 MED ORDER — BISACODYL 10 MG RE SUPP: 10.0000 mg | Freq: Every day | RECTAL | Status: DC | PRN

## 2017-05-29 MED ORDER — QUETIAPINE FUMARATE 50 MG PO TABS
75.0000 mg | ORAL_TABLET | Freq: Two times a day (BID) | ORAL | Status: DC
  Administered 2017-05-30 – 2017-06-03 (×9): 75 mg via ORAL
  Filled 2017-05-29 (×9): qty 1

## 2017-05-29 MED ORDER — QUETIAPINE FUMARATE 100 MG PO TABS
200.0000 mg | ORAL_TABLET | Freq: Every day | ORAL | Status: DC
  Administered 2017-05-29 – 2017-06-09 (×12): 200 mg via ORAL
  Filled 2017-05-29 (×12): qty 2

## 2017-05-29 MED ORDER — PROCHLORPERAZINE 25 MG RE SUPP: 12.5000 mg | Freq: Four times a day (QID) | RECTAL | Status: DC | PRN

## 2017-05-29 MED ORDER — SENNOSIDES-DOCUSATE SODIUM 8.6-50 MG PO TABS
2.0000 | ORAL_TABLET | Freq: Every day | ORAL | Status: DC
  Administered 2017-05-29 – 2017-06-12 (×9): 2 via ORAL
  Filled 2017-05-29 (×9): qty 2

## 2017-05-29 MED ORDER — ACETAMINOPHEN 325 MG PO TABS
650.0000 mg | ORAL_TABLET | Freq: Three times a day (TID) | ORAL | Status: DC
  Administered 2017-05-29 – 2017-06-13 (×58): 650 mg via ORAL
  Filled 2017-05-29 (×59): qty 2

## 2017-05-29 MED ORDER — METOPROLOL TARTRATE 50 MG PO TABS
100.0000 mg | ORAL_TABLET | Freq: Two times a day (BID) | ORAL | Status: DC
  Administered 2017-05-29 – 2017-05-31 (×4): 100 mg via ORAL
  Administered 2017-05-31: 50 mg via ORAL
  Administered 2017-06-01 – 2017-06-08 (×13): 100 mg via ORAL
  Filled 2017-05-29 (×20): qty 2

## 2017-05-29 MED ORDER — METHOCARBAMOL 500 MG PO TABS
500.0000 mg | ORAL_TABLET | Freq: Four times a day (QID) | ORAL | Status: DC | PRN
  Administered 2017-06-02 – 2017-06-13 (×15): 500 mg via ORAL
  Filled 2017-05-29 (×15): qty 1

## 2017-05-29 MED ORDER — PANTOPRAZOLE SODIUM 40 MG PO TBEC
40.0000 mg | DELAYED_RELEASE_TABLET | Freq: Two times a day (BID) | ORAL | Status: DC
  Administered 2017-05-29 – 2017-06-13 (×30): 40 mg via ORAL
  Filled 2017-05-29 (×30): qty 1

## 2017-05-29 MED ORDER — LEVETIRACETAM 500 MG PO TABS
500.0000 mg | ORAL_TABLET | Freq: Two times a day (BID) | ORAL | Status: DC
  Administered 2017-05-29 – 2017-06-13 (×30): 500 mg via ORAL
  Filled 2017-05-29 (×30): qty 1

## 2017-05-29 MED ORDER — TRAZODONE HCL 50 MG PO TABS
25.0000 mg | ORAL_TABLET | Freq: Every evening | ORAL | Status: DC | PRN
  Administered 2017-05-29 – 2017-06-01 (×2): 50 mg via ORAL
  Filled 2017-05-29 (×2): qty 1

## 2017-05-29 MED ORDER — DIPHENHYDRAMINE HCL 12.5 MG/5ML PO ELIX: 12.5000 mg | ORAL_SOLUTION | Freq: Four times a day (QID) | ORAL | Status: DC | PRN

## 2017-05-29 MED ORDER — ONDANSETRON HCL 4 MG/2ML IJ SOLN: 4.0000 mg | Freq: Four times a day (QID) | INTRAMUSCULAR | Status: DC | PRN

## 2017-05-29 MED ORDER — FLEET ENEMA 7-19 GM/118ML RE ENEM: 1.0000 | ENEMA | Freq: Once | RECTAL | Status: DC | PRN
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
## 2017-05-29 NOTE — Progress Notes (Addendum)
I met with pt and his wife at bedside with cardiology. I have cardiology clearance to admit pt to CIR today. I toured wife CIR unit and she is in agreement. I have notified Trauma PA, RN CM and SW. I will make the arrangements to admit today. [phone] 

## 2017-05-29 NOTE — Progress Notes (Signed)
Patient is very confused. He tries to get out of bed, & keeps pushing the controls & call bell. He has nonsensical & slurred speech, speaking some foul language in between, has no safety awareness & is unaware of his situation. Wrist restraints are on & have been applied correctly. They had to be readjusted due to him pulling one loose. He is incontinent & was noted earlier digging in his brief which he had a bowel movement. He was cleaned & new brief applied, but he has ripped it off & thrown it on the floor. He has been noted hitting his right leg on the foot board. Patient is very restless/agitated & is continuously on the call light. He wants to get out of the restraints, but was deemed unsafe to do so due to him removing devices. Will continue to monitor.
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
## 2017-05-29 NOTE — Progress Notes (Signed)
  Echocardiogram 2D Echocardiogram has been performed.  Celene SkeenVijay  Nuel Dejaynes 05/29/2017, 11:56 AM
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
## 2017-05-29 NOTE — Progress Notes (Signed)
RN checked pt not 15 minutes after leaving, with pt sideways in bed, cervical collar off, with confusion evident.  Condom cathter pulled off as well. RN reoriented pt with reapplying wrist restraints, applying cervical collar, mittens placed with call bell in place and brief placed. RN educated pt on importance of cervical collar and the safety precautions in place with no evidence of learning. RN will continue to reorient pt.
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
## 2017-05-29 NOTE — Progress Notes (Signed)
Report given to CIR, patient taken to CIR with all belongings and wife at bedside. Two IVs remain intact as well as restraints.
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
## 2017-05-29 NOTE — Discharge Instructions (Signed)
Living With Traumatic Brain Injury °Traumatic brain injury (TBI) is an injury to the brain that may be mild, moderate, or severe. Symptoms of any type of TBI can be long lasting (chronic). Depending on the area of the brain that is affected, a TBI can interfere with vision, memory, concentration, speech, balance, sense of touch, and sleep. TBI can also cause chronic symptoms like headache or dizziness. °How to cope with lifestyle changes °After a TBI, you may need to make changes to your lifestyle in order to recover as well as possible. How quickly and how fully you recover will depend on the severity of your injury. Your recovery plan may involve: °· Working with specialists to develop a rehabilitation plan to help you return to your regular activities. Your health care team may include: °? Physical or occupational therapists. °? Speech and language pathologists. °? Mental health counselors. °? Physicians like your primary care physician or neurologist. °· Taking time off work or school, depending on your injury. °· Avoiding situations where there is a risk for another head injury, such as football, hockey, soccer, basketball, martial arts, downhill snow sports, and horseback riding. Do not do these activities until your health care provider approves. °· Resting. Rest helps the brain to heal. Make sure you: °? Get plenty of sleep at night. Avoid staying up late at night. °? Keep the same bedtime hours on weekends and weekdays. °? Rest during the day. Take daytime naps or rest breaks when you feel tired. °· Avoiding extra stress on your eyes. You may need to set time limits when working on the computer, watching TV, and reading. °· Finding ways to manage stress. This may include: °? Avoiding activities that cause stress. °? Deep breathing, yoga, or meditation. °? Listening to music or spending time outdoors. °· Making lists, setting reminders, or using a day planner to help your memory. °· Allowing yourself plenty  of time to complete everyday tasks, such as grocery shopping, paying bills, and doing laundry. °· Avoiding driving. Your ability to drive safely may be affected by your injury. °? Rely on family, friends, or a transportation service to help you get around and to appointments. °? Have a professional evaluation to check your driving ability. °? Access support services to help you return to driving. These may include training and adaptive equipment. ° °Follow these instructions at home: °· Take over-the-counter and prescription medicines only as told by your health care provider. Do not take aspirin or other anti-inflammatory medicines such as ibuprofen or naproxen unless approved by your health care provider. °· Avoid large amounts of caffeine. Your body may be more sensitive to it after your injury. °· Do not use any products that contain nicotine or tobacco, such as cigarettes, e-cigarettes, nicotine gum, and patches. If you need help quitting, ask your health care provider. °· Do not use drugs. °· Limit alcohol intake to no more than 1 drink per day for nonpregnant women and 2 drinks per day for men. One drink equals 12 ounces of beer, 5 ounces of wine, or 1½ ounces of hard liquor. °· Do not drive until cleared by your health care provider. °· Keep all follow-up visits as told by your health care provider. This is important. °Where to find support: °· Talk with your employer, co-workers, teachers, or school counselor about your injury. Work together to develop a plan for completing tasks while you recover. °· Talk to others living with a TBI. Join a support group with other   people who have experienced a TBI. °· Let your friends and family members know what they can do to help. This might include helping at home or transportation to appointments. °· If you are unable to continue working after your injury, talk to a social worker about options to help you meet your financial needs. °· Seek out additional resources if  you are a military serviceman or family member, such as: °? [facility]: dvbic[facility].mil °? [facility] [employer] [facility] [employer] Military and Veterans Crisis Line: 1-800-273-8255 °Questions to ask your health care provider: °· How serious is my injury? °· What is my rehabilitation plan? °· What is my expected recovery? °· When can I return to work or school? °· When can I return to regular activities, including driving? °Contact a health care provider if: °· You have new or worsening: °? Dizziness. °? Headache. °? Anxiety or depression. °? Irritability. °? Confusion. °? Jerky movements that you cannot control (seizures). °? Extreme sensitivity to light or sound. °? Nausea or vomiting. °Summary °· Traumatic brain injury (TBI) is an injury to your brain that can interfere with vision, memory, concentration, speech, balance, sense of touch, and sleep. TBI can also cause chronic symptoms like headache or dizziness. °· After a TBI you may need to make several changes to your lifestyle in order to recover as well as possible. How quickly and how fully you recover will depend on the severity of your injury. °· Talk to your family, friends, employer, co-workers, teachers, or school counselor about your injury. Work together to develop a plan for completing tasks while you recover. °This information is not intended to replace advice given to you by your health care provider. Make sure you discuss any questions you have with your health care provider. °Document Released: 05/08/2016 Document Revised: 05/08/2016 Document Reviewed: 05/08/2016 °Elsevier Interactive Patient Education © 2018 [employer] ° °

## 2017-05-30 ENCOUNTER — Inpatient Hospital Stay (HOSPITAL_COMMUNITY): Payer: BLUE CROSS/BLUE SHIELD | Admitting: Speech Pathology

## 2017-05-30 ENCOUNTER — Inpatient Hospital Stay (HOSPITAL_COMMUNITY): Payer: BLUE CROSS/BLUE SHIELD

## 2017-05-30 ENCOUNTER — Inpatient Hospital Stay (HOSPITAL_COMMUNITY): Payer: BLUE CROSS/BLUE SHIELD | Admitting: Physical Therapy

## 2017-05-30 LAB — HEPATIC FUNCTION PANEL
ALBUMIN: 2.9 g/dL — AB (ref 3.5–5.0)
ALT: 30 U/L (ref 17–63)
AST: 25 U/L (ref 15–41)
Alkaline Phosphatase: 202 U/L — ABNORMAL HIGH (ref 38–126)
BILIRUBIN DIRECT: 0.2 mg/dL (ref 0.1–0.5)
Indirect Bilirubin: 0.8 mg/dL (ref 0.3–0.9)
TOTAL PROTEIN: 6.6 g/dL (ref 6.5–8.1)
Total Bilirubin: 1 mg/dL (ref 0.3–1.2)

## 2017-05-30 LAB — CBC WITH DIFFERENTIAL/PLATELET
BASOS PCT: 0 %
Basophils Absolute: 0 10*3/uL (ref 0.0–0.1)
Eosinophils Absolute: 0.1 10*3/uL (ref 0.0–0.7)
Eosinophils Relative: 2 %
HCT: 36.6 % — ABNORMAL LOW (ref 39.0–52.0)
HEMOGLOBIN: 12.1 g/dL — AB (ref 13.0–17.0)
LYMPHS PCT: 27 %
Lymphs Abs: 1.4 10*3/uL (ref 0.7–4.0)
MCH: 31.6 pg (ref 26.0–34.0)
MCHC: 33.1 g/dL (ref 30.0–36.0)
MCV: 95.6 fL (ref 78.0–100.0)
MONO ABS: 0.5 10*3/uL (ref 0.1–1.0)
MONOS PCT: 9 %
NEUTROS ABS: 3.2 10*3/uL (ref 1.7–7.7)
NEUTROS PCT: 62 %
Platelets: 358 10*3/uL (ref 150–400)
RBC: 3.83 MIL/uL — ABNORMAL LOW (ref 4.22–5.81)
RDW: 13.1 % (ref 11.5–15.5)
WBC: 5.2 10*3/uL (ref 4.0–10.5)

## 2017-05-30 LAB — BASIC METABOLIC PANEL
ANION GAP: 11 (ref 5–15)
BUN: 15 mg/dL (ref 6–20)
CALCIUM: 8.9 mg/dL (ref 8.9–10.3)
CO2: 24 mmol/L (ref 22–32)
Chloride: 103 mmol/L (ref 101–111)
Creatinine, Ser: 0.63 mg/dL (ref 0.61–1.24)
GLUCOSE: 105 mg/dL — AB (ref 65–99)
POTASSIUM: 3.7 mmol/L (ref 3.5–5.1)
SODIUM: 138 mmol/L (ref 135–145)
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
## 2017-05-30 NOTE — Progress Notes (Signed)
Subjective/Complaints:  Was trying to get OOB last noc  Working with SLP today  ROS- denies CP, SOB, N/V/D  Objective: Vital Signs: Blood pressure (!) 106/58, pulse 85, temperature 98.4 F (36.9 C), temperature source Oral, resp. rate 20, height 6\' 5"  (1.956 m), weight 78.5 kg (173 lb 1 oz), SpO2 100 %. Ct Angio Chest Pe W Or Wo Contrast  Result Date: 05/28/2017 CLINICAL DATA:  Persistent tachycardia. EXAM: CT ANGIOGRAPHY CHEST WITH CONTRAST TECHNIQUE: Multidetector CT imaging of the chest was performed using the standard protocol during bolus administration of intravenous contrast. Multiplanar CT image reconstructions and MIPs were obtained to evaluate the vascular anatomy. CONTRAST:  100mL ISOVUE-370 IOPAMIDOL (ISOVUE-370) INJECTION 76% COMPARISON:  05/09/2017 FINDINGS: Cardiovascular: Satisfactory opacification of the pulmonary arteries to the segmental level. No evidence of pulmonary embolism. Normal heart size. No pericardial effusion. Mediastinum/Nodes: No enlarged mediastinal, hilar, or axillary lymph nodes. Thyroid gland, trachea, and esophagus demonstrate no significant findings. Lungs/Pleura: Interval resolution of pleural fluid since previous study. Small residual loculated collection along the right lateral chest wall may represent an extrapleural hematoma. Atelectasis in the lung bases. No pneumothorax. Airways are patent. Upper Abdomen: No acute abnormality. Musculoskeletal: Multiple healing right rib fractures as previously identified. Review of the MIP images confirms the above findings. IMPRESSION: 1. No evidence of significant pulmonary embolus. 2. No evidence of active pulmonary disease. 3. Multiple healing right rib fractures as previously identified. Small residual loculated collection along the right lateral chest wall may represent extrapleural hematoma. Improvement of previous pleural fluid. Electronically Signed   By: Burman NievesWilliam  Stevens M.D.   On: 05/28/2017 23:45   Results  for orders placed or performed during the hospital encounter of 05/29/17 (from the past 72 hour(s))  CBC WITH DIFFERENTIAL     Status: Abnormal   Collection Time: 05/30/17  7:18 AM  Result Value Ref Range   WBC 5.2 4.0 - 10.5 K/uL   RBC 3.83 (L) 4.22 - 5.81 MIL/uL   Hemoglobin 12.1 (L) 13.0 - 17.0 g/dL   HCT 96.036.6 (L) 45.439.0 - 09.852.0 %   MCV 95.6 78.0 - 100.0 fL   MCH 31.6 26.0 - 34.0 pg   MCHC 33.1 30.0 - 36.0 g/dL   RDW 11.913.1 14.711.5 - 82.915.5 %   Platelets 358 150 - 400 K/uL   Neutrophils Relative % 62 %   Neutro Abs 3.2 1.7 - 7.7 K/uL   Lymphocytes Relative 27 %   Lymphs Abs 1.4 0.7 - 4.0 K/uL   Monocytes Relative 9 %   Monocytes Absolute 0.5 0.1 - 1.0 K/uL   Eosinophils Relative 2 %   Eosinophils Absolute 0.1 0.0 - 0.7 K/uL   Basophils Relative 0 %   Basophils Absolute 0.0 0.0 - 0.1 K/uL     HEENT: normal Cardio: RRR and no murmur Resp: CTA B/L and unlabored GI: BS positive and NT, ND Extremity:  Pulses positive and No Edema Skin:   Intact Neuro: Alert/Oriented, Abnormal Sensory difficult to assess due to reduced attn, Normal Motor and Inattention Musc/Skel:  Other no pain with UE or LE ROM Gen NAD   Assessment/Plan: 1. Functional deficits secondary to TBI skull fracture which require 3+ hours per day of interdisciplinary therapy in a comprehensive inpatient rehab setting. Physiatrist is providing close team supervision and 24 hour management of active medical problems listed below. Physiatrist and rehab team continue to assess barriers to discharge/monitor patient progress toward functional and medical goals. FIM:       Function - Toileting  Toileting steps completed by patient: Adjust clothing prior to toileting, Adjust clothing after toileting Toileting steps completed by helper: Performs perineal hygiene Toileting Assistive Devices: (brief)  Function - Archivist transfer assistive device: (brief)        Function - Comprehension Comprehension:  Auditory Comprehension assist level: Understands basic 25 - 49% of the time/ requires cueing 50 - 75% of the time  Function - Expression Expression: Verbal Expression assist level: Expresses basic 25 - 49% of the time/requires cueing 50 - 75% of the time. Uses single words/gestures.  Function - Social Interaction Social Interaction assist level: Interacts appropriately 25 - 49% of time - Needs frequent redirection.  Function - Problem Solving Problem solving assist level: Solves basic less than 25% of the time - needs direction nearly all the time or does not effectively solve problems and may need a restraint for safety  Function - Memory Memory assist level: Recognizes or recalls less than 25% of the time/requires cueing greater than 75% of the time Patient normally able to recall (first 3 days only): None of the above  Medical Problem List and Plan: 1.  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  DVT Prophylaxis/Anticoagulation: Mechanical: Sequential compression devices, below knee Bilateral lower extremities 3. Headaches/ Pain Management: Will schedule tylenol at this time as question pain a limiting factor. May need Depakote for behavior and HA 4. Mood: Monitor as mentation improves. LCSW to follow along for evaluation and support when appropriate.  5. Neuropsych: This patient is not capable of making decisions on his own behalf. 6. Skin/Wound Care: routine pressure relief measures.  7. Fluids/Electrolytes/Nutrition: Monitor I/O. Check lytes in am 8. Seizures: Continue Keppra bid.  9. Dysphagia: Monitor oral intake--continue tube feeds due to fluctuating mentation.  10. HTN: Monitor qid for now. Continue catapres TTS 2 today.  11. Tachycardia: Resting heart rate improved. Will monitor HR qid. Continue metoprolol 100 mg bid. Vitals:   05/30/17 0319 05/30/17 0500  BP: 114/80 (!) 106/58  Pulse:  100 85  Resp: 20 20  Temp: 98.4 F (36.9 C)   SpO2: 100%      12. ABLA:Improved Hgb 12.1 Add multivitamin with iron.   13.  Sleep wake cycle:  Has been sleeping during the day and up at nights. Per wife needs little sleep and was up by 4 am daily for work and would be asleep by 8 PM most nights.             - Check sleep-wake chart.              -Decrease am Seroquel dose to 75 mg bid and increase bedtime dose to 200 mg adjusting schedule to approximately, 8-9 PM nightly.  15. 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 Per reports--tinnitus was worsening as well as problems with equilibrium since thanksgiving-question Meniere's disease.  Check orthostatic vitals.    LOS (Days) 1 A FACE TO FACE EVALUATION WAS PERFORMED  Erick Colace 05/30/2017, 8:33 AM
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
## 2017-05-31 NOTE — Plan of Care (Signed)
  Not Progressing RH SAFETY RH STG ADHERE TO SAFETY PRECAUTIONS W/ASSISTANCE/DEVICE Description STG Adhere to Safety Precautions With mod Assistance/Device. Poor to no safety awareness. 05/31/2017 2241 - Not Progressing by Martina SinnerMurray, Ramon Zanders A, RN RH COGNITION-NURSING RH STG USES MEMORY AIDS/STRATEGIES W/ASSIST TO PROBLEM SOLVE Description STG Uses Memory Aids/Strategies With mod Assistance to Problem Solve.  Max assist 05/31/2017 2241 - Not Progressing by Martina SinnerMurray, Suan Pyeatt A, RN
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
## 2017-05-31 NOTE — Plan of Care (Signed)
  Not Progressing RH BOWEL ELIMINATION RH STG MANAGE BOWEL WITH ASSISTANCE Description STG Manage Bowel with min Assistance.  Total assist-incontinent 05/31/2017 0546 - Not Progressing by Martina SinnerMurray, Vesta Wheeland A, RN RH BLADDER ELIMINATION RH STG MANAGE BLADDER WITH ASSISTANCE Description STG Manage Bladder With min Assistance  Incontinent-total assist. 05/31/2017 0546 - Not Progressing by Martina SinnerMurray, Ramelo Oetken A, RN RH SAFETY RH STG ADHERE TO SAFETY PRECAUTIONS W/ASSISTANCE/DEVICE Description STG Adhere to Safety Precautions With mod Assistance/Device. No safety awareness.  05/31/2017 0546 - Not Progressing by Martina SinnerMurray, Charlisha Market A, RN RH COGNITION-NURSING RH STG USES MEMORY AIDS/STRATEGIES W/ASSIST TO PROBLEM SOLVE Description STG Uses Memory Aids/Strategies With mod Assistance to Problem Solve.  Max assist. 05/31/2017 0546 - Not Progressing by Martina SinnerMurray, Lucetta Baehr A, RN

## 2017-06-01 ENCOUNTER — Inpatient Hospital Stay (HOSPITAL_COMMUNITY): Payer: BLUE CROSS/BLUE SHIELD | Admitting: Occupational Therapy

## 2017-06-01 ENCOUNTER — Inpatient Hospital Stay (HOSPITAL_COMMUNITY): Payer: BLUE CROSS/BLUE SHIELD

## 2017-06-01 NOTE — Progress Notes (Signed)
Occupational Therapy Session Note  Patient Details  Name: Anthony Singleton MRN: [id number] Date of Birth: 1970/09/05  Today's Date: 06/01/2017 OT Individual Time: [id number] and [id number] OT Individual Time Calculation (min): 56 min and 33 min   Short Term Goals: Week 1:  OT Short Term Goal 1 (Week 1): Pt will stand at sink wiht min A to groom  OT Short Term Goal 2 (Week 1): Pt will sequence bathing body parts wiht min VC OT Short Term Goal 3 (Week 1): Pt will initiate clothing mangment after toileting with no more than 1 VC OT Short Term Goal 4 (Week 1): Pt will attend to funtional task for 3 min and min VC    Skilled Therapeutic Interventions/Progress Updates:    Pt greeted in enclosure bed, lethargic but agreeable to tx. He was not wearing aspen collar, per RN this order has been d/c. Pt completed stand pivot<w/c with Mod A. Bathing/dressing completed w/c level at sink. Pt requiring max cues for initiation, sequencing, safety, and sustained attention. Sit<stand with Mod A for pericare, pt sidestepping over to wall and trying to hold onto paper towel dispenser. Pt perseverating on washing face and also periarea. Mod cues for self sequencing during dressing tasks and Min A for footwear due to fatigue/self limiting behaviors. Pt propped his feet up on secure chair to don socks. He required max encouragement to complete these tasks at max level of independence, as he would inquire for assist from OT and spouse before he tried an ADL task himself. Grooming/oral care tasks completed w/c level with supervision and min cues for visually scanning to Lt side. At end of tx pt was reclined in TIS with lap belt secured. NT notified to provide supervision during breakfast. Spouse refusing application of wrist restraints or pink safety belt. Notified RN. At session exit pt left with spouse.   2nd Session 1:1 tx (33 min) Pt greeted in secure enclosure bed. Worked on sustained attention, awareness, and  orientation while conversing about his family. Referred him to family photos in room, with pt fondly recalling their names. Unable to provide specific details of their hobbies or accurate ages. He tended to perseverate on certain topics, repeating things, and required max cues for redirection. At session exit pt was left in secured enclosure bed.   Pt oriented to place and situation during tx. Disoriented to time.    Therapy Documentation Precautions:  Precautions Precautions: Fall, Cervical Precaution Comments: monitor vitals Required Braces or Orthoses: Cervical Brace Cervical Brace: Hard collar, At all times Restrictions Weight Bearing Restrictions: No Pain: Pain Assessment Pain Assessment: Faces Faces Pain Scale: No hurt Pain Type: Acute pain Pain Location: Generalized Pain Orientation: Other (Comment)(all over) Pain Descriptors / Indicators: Aching;Discomfort Pain Frequency: Intermittent Pain Onset: Gradual Patients Stated Pain Goal: 0 Pain Intervention(s): Medication (See eMAR) ADL:     See Function Navigator for Current Functional Status.   Therapy/Group: Individual Therapy  Treon Kehl A Cahterine Heinzel 06/01/2017, 12:12 PM

## 2017-06-01 NOTE — Progress Notes (Signed)
   06/01/17 1800  What Happened  Was fall witnessed? No  Was patient injured? No  Patient found on floor  Found by Staff-comment (patient stated he unzipped the enclosure bed and crawled out)  Stated prior activity other (comment) (laying down in enclosure bed)  Follow Up  MD notified Anthony LefevreEunice Thomas, NP  Time MD notified [phone]  Family notified Yes-comment Anthony Singleton(Dawn (wife))  Time family notified [id number]  Additional tests Yes-comment (CT of head)  Progress note created (see row info) Yes  Adult Fall Risk Assessment  Risk Factor Category (scoring not indicated) High fall risk per protocol (document High fall risk)  Patient's Fall Risk High Fall Risk (>13 points)  Adult Fall Risk Interventions  Required Bundle Interventions *See Row Information* High fall risk - low, moderate, and high requirements implemented  Additional Interventions Lap belt while in chair/wheelchair;Room near nurses station;Reorient/diversional activities with confused patients  Screening for Fall Injury Risk  Required Injury Bundle Interventions *See Row Information* Injury Bundle Implemented Except Low Bed  Screening for Fall Injury Risk Interventions  Additional Interventions Family supervision  Specialty Low Bed Contraindicated (Enclosure bed)  Vitals  BP 105/82  BP Method Automatic  Patient Position (if appropriate) Lying  Pulse Rate (!) 105  Pulse Rate Source Dinamap  Oxygen Therapy  SpO2 100 %  O2 Device Room Air  Neurological  Neuro (WDL) X  Level of Consciousness Alert  Orientation Level Oriented to person;Disoriented to place;Disoriented to time;Disoriented to situation  Cognition Impulsive;Poor attention/concentration;Poor judgement;Poor safety awareness;Memory impairment  Speech Clear  R Hand Grip Strong  L Hand Grip Strong  RUE Motor Response Purposeful movement  RUE Motor Strength 5  LUE Motor Response Purposeful movement  LUE Motor Strength 5  RLE Motor Response Purposeful movement  RLE Motor  Strength 5  LLE Motor Response Purposeful movement  LLE Motor Strength 5  Neuro Symptoms Agitation;Anxiety;Forgetful  Neuro symptoms relieved by Rest;Anti-anxiety medication  Integumentary  Integumentary (WDL) X  Skin Color Appropriate for ethnicity  Skin Condition Dry  Skin Integrity Abrasion;Ecchymosis  Abrasion Location Head;Arm  Abrasion Location Orientation Right;Left  Abrasion Intervention Other (Comment) (assessed)  Ecchymosis Location Arm;Eye;Face;Flank;Foot;Head;Leg;Hip;Rib  Ecchymosis Location Orientation Right;Left  Ecchymosis Intervention Other (Comment) (assessed)  Skin Turgor Non-tenting

## 2017-06-01 NOTE — Significant Event (Signed)
Chrissie NoaMelanie Barnes, RN was walking past room and saw patient lying in floor, next to his enclosure bed, with a pillow and blanket.  I presented to help, obtained VS, all WNL (see flowsheet).  He complains of head and back pain.  He could not give me a clear answer as to whether or not he struck his head on the floor.  He kept saying "I wouldn't put my dog in a fucking contraption like this" "I should knock her head off for putting me in here."  Called Jacalyn LefevreEunice Thomas, NP and new orders received for CT scan of head.  RN asked for something to sedate patient for test as he will most likely not go willingly, Riley Lamunice advised she would call back soon with additional orders.  Patient assisted to the recliner with supervision from primary RN.  Primary RN reports patient has been complaining of head and back pain prior to this incident.  Alerted nursing supervisor of event.  Dani Gobbleeardon, Chevon Laufer J, RN

## 2017-06-01 NOTE — Progress Notes (Signed)
Physical Therapy Session Note  Patient Details  Name: Anthony Singleton MRN: [id number] Date of Birth: 1971/01/14  Today's Date: 06/01/2017 PT Individual Time: [id number] PT Individual Time Calculation (min): 75 min   Short Term Goals: Week 1:  PT Short Term Goal 1 (Week 1): Pt will locate items in L visual field in 50% of opportunities without cues PT Short Term Goal 2 (Week 1): Pt will transfer with min assist and LRAD PT Short Term Goal 3 (Week 1): Pt will ambulate 7150' with LRAD and mod assist PT Short Term Goal 4 (Week 1): Pt will initiate stair training with PT PT Short Term Goal 5 (Week 1): Pt will identify 1 impairment with mod question cues.    Skilled Therapeutic Interventions/Progress Updates:    Patient in vail bed and reluctant to participate, but agreeable if I get him a Coke.  Patient S for supine to sit and min A sit to stand and ambulation with min to mod A without device x 40-50' x 4.  Patient kept pulling off aspen collar throughout session (did note d/c's in chart after session).  Patient negotiated 4 steps with min A with rails and mod cues for redirection back down same side rather than continuing on to next stair section.  Patient negotiated ramp, walking through mulch and stepping off curb using side railings and min A.  In supine on mat performed bridging, hooklying marching and leg lifts.  Supine to sit S and performed sit<.>stand x 5 min A.  Initiated Berg balance test, pt unable to stand 2 minutes due to distractions and impulsive.  Able to stand with eyes closed and turn in circle and look over opposite shoulders min A.  Patient in room requesting to call brother, no number in chart for his brother, encouraged to wait for wife to make that phone call later today.  Patient left in enclosure bed with call button.   Therapy Documentation Precautions:  Precautions Precautions: Fall, Cervical Precaution Comments: monitor vitals Required Braces or Orthoses: Cervical  Brace Cervical Brace: Hard collar, At all times Restrictions Weight Bearing Restrictions: No Pain: Pain Assessment Pain Score: 0-No pain Faces Pain Scale: Hurts little more Pain Type: Acute pain Pain Location: Generalized Pain Descriptors / Indicators: Aching Pain Onset: Gradual Pain Intervention(s): Repositioned    Balance: Standardized Balance Assessment Standardized Balance Assessment: Berg Balance Test Berg Balance Test Sit to Stand: Needs minimal aid to stand or to stabilize Standing Unsupported: Able to stand 30 seconds unsupported Sitting with Back Unsupported but Feet Supported on Floor or Stool: Able to sit safely and securely 2 minutes Stand to Sit: Sits independently, has uncontrolled descent Standing Unsupported with Eyes Closed: Able to stand 10 seconds with supervision From Standing Position, Pick up Object from Floor: Unable to try/needs assist to keep balance From Standing Position, Turn to Look Behind Over each Shoulder: Needs supervision when turning Turn 360 Degrees: Needs assistance while turning   See Function Navigator for Current Functional Status.   Therapy/Group: Individual Therapy  Elray McgregorCynthia Wynn 06/01/2017, 3:43 PM

## 2017-06-01 NOTE — Progress Notes (Signed)
Patient information reviewed and entered into eRehab system by Nyonna Hargrove, RN, CRRN, PPS Coordinator.  Information including medical coding and functional independence measure will be reviewed and updated through discharge.    

## 2017-06-01 NOTE — Progress Notes (Signed)
At 2105,  BP 96/79, HR-114. Paged Dr. Wynn BankerKirsteins, orders to give half dose of scheduled lopressor. Patient complains of feeling dizzy when standing. At 2114, 1 oxy IR given for complaint of back pain. Slept good since 2300. Continent B & B. Difficult to redirect at times, verbally inappropriate. Alfredo MartinezMurray, Hawke Villalpando A

## 2017-06-01 NOTE — Progress Notes (Signed)
Speech Language Pathology Daily Session Note  Patient Details  Name: Anthony Singleton XXXWilliams MRN: [id number] Date of Birth: March 24, 1971  Today's Date: 06/01/2017 SLP Individual Time: 1300-1400 SLP Individual Time Calculation (min): 60 min  Short Term Goals: Week 1: SLP Short Term Goal 1 (Week 1): Patient will consume current diet with minimal overt s/s of aspiration with Mod A verbal cues for use of swallowing compensatory strategies.  SLP Short Term Goal 2 (Week 1): Patient will consume trials of thin liquids via cup with minimal overt s/s of aspiration and Mod A verbal cues for use of strategies over 3 sessions prior to upgrade.  SLP Short Term Goal 3 (Week 1): Patient will demonstrate sustained attention to a functional task for ~10 minutes with Mod A verbal cues for redirection.  SLP Short Term Goal 4 (Week 1): Patient will verbalize 1 cognitive and 1 physical deficit with Max A verbal cues.  SLP Short Term Goal 5 (Week 1): Patient will demonstrate functional problem solving for basic and familiar tasks with Max A verbal cues.  SLP Short Term Goal 6 (Week 1): Patient will utilize external aids to orient to time, place and situation with supervision verbal cues.   Skilled Therapeutic Interventions: Skilled ST services focused on swallow and cognitive skills. Pt requested to use the bathroom upon entering room. SLP assist pt to bathroom and required Max A verbal to demonstrate safety precautions to reduce impulsivity during transferring movement. SLP facilitated PO consumption of dys 3 and nectar thick liquids during lunch time meal, pt required mod A verbal cues to slow rate and demonstrated no overt s/s aspiration. SLP facilitated functional problem solving identifying unsafe situations and providing appropriate solutions given picture scenes with Min A verbal cues, however continues to demonstrated impairment in insight requiring max A verbal cues for awareness of current safety precautions. Pt  required Mod A verbal cues for sustained attention approximately every 10 minutes and perseverated on "people trying to hurt my kids" SLP comforted pt and redirected with cues. SLP assisted pt back into enclosure bed and made sure call bell was within reach, SLP educated pt how to use call bell and pt returned demonstration. Recommend to continue skilled ST services.     Function:  Eating Eating   Modified Consistency Diet: Yes Eating Assist Level: More than reasonable amount of time;Set up assist for;Supervision or verbal cues   Eating Set Up Assist For: Opening containers       Cognition Comprehension Comprehension assist level: Understands basic 25 - 49% of the time/ requires cueing 50 - 75% of the time  Expression   Expression assist level: Expresses basic 25 - 49% of the time/requires cueing 50 - 75% of the time. Uses single words/gestures.  Social Interaction Social Interaction assist level: Interacts appropriately 25 - 49% of time - Needs frequent redirection.  Problem Solving Problem solving assist level: Solves basic less than 25% of the time - needs direction nearly all the time or does not effectively solve problems and may need a restraint for safety  Memory Memory assist level: Recognizes or recalls less than 25% of the time/requires cueing greater than 75% of the time    Pain Pain Assessment Pain Assessment: No/denies pain Pain Score: 0-No pain Faces Pain Scale: Hurts little more Pain Type: Acute pain Pain Location: Generalized Pain Descriptors / Indicators: Aching Pain Onset: Gradual Pain Intervention(s): Repositioned  Therapy/Group: Individual Therapy  Tannen Vandezande  [facility] 06/01/2017, 3:45 PM

## 2017-06-01 NOTE — Progress Notes (Signed)
Subjective/Complaints:  no new issues. Slept somewhat last night. Slow to arouse this morning  ROS: pt denies nausea, vomiting, diarrhea, cough, shortness of breath or chest pain    Objective: Vital Signs: Blood pressure 115/78, pulse (!) 107, temperature 98.6 F (37 C), temperature source Oral, resp. rate 18, height 6' 5" (1.956 m), weight 78.5 kg (173 lb 1 oz), SpO2 99 %. No results found. Results for orders placed or performed during the hospital encounter of 05/29/17 (from the past 72 hour(s))  Basic metabolic panel     Status: Abnormal   Collection Time: 05/30/17  7:18 AM  Result Value Ref Range   Sodium 138 135 - 145 mmol/L   Potassium 3.7 3.5 - 5.1 mmol/L   Chloride 103 101 - 111 mmol/L   CO2 24 22 - 32 mmol/L   Glucose, Bld 105 (H) 65 - 99 mg/dL   BUN 15 6 - 20 mg/dL   Creatinine, Ser 0.63 0.61 - 1.24 mg/dL   Calcium 8.9 8.9 - 10.3 mg/dL   GFR calc non Af Amer >60 >60 mL/min   GFR calc Af Amer >60 >60 mL/min    Comment: (NOTE) The eGFR has been calculated using the CKD EPI equation. This calculation has not been validated in all clinical situations. eGFR's persistently <60 mL/min signify possible Chronic Kidney Disease.    Anion gap 11 5 - 15  Hepatic function panel     Status: Abnormal   Collection Time: 05/30/17  7:18 AM  Result Value Ref Range   Total Protein 6.6 6.5 - 8.1 g/dL   Albumin 2.9 (L) 3.5 - 5.0 g/dL   AST 25 15 - 41 U/L   ALT 30 17 - 63 U/L   Alkaline Phosphatase 202 (H) 38 - 126 U/L   Total Bilirubin 1.0 0.3 - 1.2 mg/dL   Bilirubin, Direct 0.2 0.1 - 0.5 mg/dL   Indirect Bilirubin 0.8 0.3 - 0.9 mg/dL  CBC WITH DIFFERENTIAL     Status: Abnormal   Collection Time: 05/30/17  7:18 AM  Result Value Ref Range   WBC 5.2 4.0 - 10.5 K/uL   RBC 3.83 (L) 4.22 - 5.81 MIL/uL   Hemoglobin 12.1 (L) 13.0 - 17.0 g/dL   HCT 36.6 (L) 39.0 - 52.0 %   MCV 95.6 78.0 - 100.0 fL   MCH 31.6 26.0 - 34.0 pg   MCHC 33.1 30.0 - 36.0 g/dL   RDW 13.1 11.5 - 15.5 %   Platelets 358 150 - 400 K/uL   Neutrophils Relative % 62 %   Neutro Abs 3.2 1.7 - 7.7 K/uL   Lymphocytes Relative 27 %   Lymphs Abs 1.4 0.7 - 4.0 K/uL   Monocytes Relative 9 %   Monocytes Absolute 0.5 0.1 - 1.0 K/uL   Eosinophils Relative 2 %   Eosinophils Absolute 0.1 0.0 - 0.7 K/uL   Basophils Relative 0 %   Basophils Absolute 0.0 0.0 - 0.1 K/uL     HEENT: normal Cardio: RRR without murmur. No JVD  Resp: CTA Bilaterally without wheezes or rales. Normal effort  GI: BS positive and NT, ND Extremity:  Pulses positive and No Edema Skin:   Intact Neuro: slow to awaken this morning. Abnormal Sensory difficult to assess due to reduced attn, Normal Motor and Inattention Musc/Skel:  Other no pain with UE or LE ROM Gen NAD   Assessment/Plan: 1. Functional deficits secondary to TBI skull fracture which require 3+ hours per day of interdisciplinary therapy in a comprehensive  inpatient rehab setting. Physiatrist is providing close team supervision and 24 hour management of active medical problems listed below. Physiatrist and rehab team continue to assess barriers to discharge/monitor patient progress toward functional and medical goals. FIM: Function - Bathing Position: Wheelchair/chair at sink Body parts bathed by patient: Right arm, Left arm, Chest, Abdomen(UB only) Body parts bathed by helper: Back Bathing not applicable: Front perineal area, Buttocks, Right upper leg, Left upper leg, Right lower leg, Left lower leg Assist Level: Supervision or verbal cues  Function- Upper Body Dressing/Undressing Assist Level: Touching or steadying assistance(Pt > 75%)  Function - Toileting Toileting steps completed by patient: Adjust clothing prior to toileting, Performs perineal hygiene Toileting steps completed by helper: Adjust clothing after toileting Toileting Assistive Devices: Grab bar or rail Assist level: Touching or steadying assistance (Pt.75%)  Function - Toilet Transfers Toilet  transfer assistive device: Elevated toilet seat/BSC over toilet Assist level to toilet: 2 helpers(for safety) Assist level from toilet: Touching or steadying assistance (Pt > 75%)  Function - Chair/bed transfer Chair/bed transfer method: Stand pivot Chair/bed transfer assist level: Moderate assist (Pt 50 - 74%/lift or lower) Chair/bed transfer assistive device: Armrests Chair/bed transfer details: Verbal cues for sequencing, Verbal cues for technique, Verbal cues for precautions/safety, Verbal cues for safe use of DME/AE, Manual facilitation for weight shifting  Function - Locomotion: Wheelchair Will patient use wheelchair at discharge?: No Function - Locomotion: Ambulation Ambulation activity did not occur: Safety/medical concerns Walk 10 feet on uneven surfaces activity did not occur: Safety/medical concerns  Function - Comprehension Comprehension: Auditory Comprehension assist level: Understands basic 25 - 49% of the time/ requires cueing 50 - 75% of the time  Function - Expression Expression: Verbal Expression assist level: Expresses basic 25 - 49% of the time/requires cueing 50 - 75% of the time. Uses single words/gestures.  Function - Social Interaction Social Interaction assist level: Interacts appropriately 25 - 49% of time - Needs frequent redirection.  Function - Problem Solving Problem solving assist level: Solves basic less than 25% of the time - needs direction nearly all the time or does not effectively solve problems and may need a restraint for safety  Function - Memory Memory assist level: Recognizes or recalls less than 25% of the time/requires cueing greater than 75% of the time Patient normally able to recall (first 3 days only): None of the above  Medical Problem List and Plan: 1.  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  DVT Prophylaxis/Anticoagulation: Mechanical: Sequential compression devices, below knee Bilateral lower extremities 3. Headaches/ Pain Management: tylenol   -consider trial of depakote or topamax 4. Mood: Monitor as mentation improves. LCSW to follow along for evaluation and support when appropriate.  5. Neuropsych: This patient is not capable of making decisions on his own behalf. 6. Skin/Wound Care: routine pressure relief measures.  7. Fluids/Electrolytes/Nutrition: Monitor I/O. BMET normal 1/5  8. Seizures: Continue Keppra bid.  9. Dysphagia: Monitor oral intake--continue tube feeds due to fluctuating mentation.  10. HTN: Monitor qid for now. Continue catapres TTS 2 today.  11. Tachycardia: Resting heart rate improved. Will monitor HR qid. Continue metoprolol 100 mg bid.likely related to TBI catecholamine release although ,may be on dry side given poor oral fluid intake howver BMET nl 1/5 Vitals:   05/31/17 2106   06/01/17 0600  BP: 96/79 115/78  Pulse: (!) 114 (!) 107  Resp:  18  Temp:  98.6 F (37 C)  SpO2:  99%     12. ABLA:Improved Hgb 12.1 Add multivitamin with iron.   13.  Sleep wake cycle:  Has been sleeping during the day and up at nights. Per wife needs little sleep and was up by 4 am daily for work and would be asleep by 8 PM most nights.             -  sleep-wake chart.              -Decrease am Seroquel dose to 75 mg bid   200 mg at 8pm 14.  Hypoalbuminemia addded prostat 15. 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 Per reports--tinnitus was worsening as well as problems with equilibrium since thanksgiving-question Meniere's disease.    16.  C Spine CT negative   LOS (Days) 3 A FACE TO FACE EVALUATION WAS PERFORMED  , T 06/01/2017, 9:57 AM   

## 2017-06-02 ENCOUNTER — Inpatient Hospital Stay (HOSPITAL_COMMUNITY): Payer: BLUE CROSS/BLUE SHIELD | Admitting: Occupational Therapy

## 2017-06-02 ENCOUNTER — Inpatient Hospital Stay (HOSPITAL_COMMUNITY): Payer: BLUE CROSS/BLUE SHIELD | Admitting: Physical Therapy

## 2017-06-02 ENCOUNTER — Inpatient Hospital Stay (HOSPITAL_COMMUNITY): Payer: BLUE CROSS/BLUE SHIELD | Admitting: Speech Pathology

## 2017-06-02 ENCOUNTER — Inpatient Hospital Stay (HOSPITAL_COMMUNITY): Payer: BLUE CROSS/BLUE SHIELD

## 2017-06-02 MED ORDER — TRAZODONE HCL 50 MG PO TABS
50.0000 mg | ORAL_TABLET | Freq: Every day | ORAL | Status: DC
  Administered 2017-06-02 – 2017-06-08 (×7): 50 mg via ORAL
  Filled 2017-06-02 (×7): qty 1

## 2017-06-02 MED ORDER — METHYLPHENIDATE HCL 5 MG PO TABS
5.0000 mg | ORAL_TABLET | Freq: Two times a day (BID) | ORAL | Status: DC
  Administered 2017-06-02 – 2017-06-08 (×13): 5 mg via ORAL
  Filled 2017-06-02 (×14): qty 1

## 2017-06-02 NOTE — Progress Notes (Signed)
Recreational Therapy Session Note  Patient Details  Name: Shelda Alteserry Dwayne XXXWilliams MRN: [id number] Date of Birth: August 01, 1970 Today's Date: 06/02/2017   TR eval deferred, pt not yet appropriate.  Will continue to monitor through team for future participation.  Jazsmine Macari 06/02/2017, 9:08 AM

## 2017-06-02 NOTE — Progress Notes (Signed)
Occupational Therapy Note  Patient Details  Name: Shelda Alteserry Dwayne XXXWilliams MRN: [id number] Date of Birth: 28-Apr-1971  Today's Date: 06/02/2017 OT Individual Time: 1500-1531 OT Individual Time Calculation (min): 31 min  and Today's Date: 06/02/2017 OT Missed Time: 29 Minutes Missed Time Reason: Patient fatigue  Upon entering the room, pt sleeping in enclosure bed. Pt requiring max cues for participation this session. Pt making choice between two clothing options and agreeable to shower. However, pt refusing to exit bed even with tactile and verbal cues to do so with increased time. OT attempting to extract pt from bed for over 15 minutes and he closed eyes and ignored therapist attempt while mumbling unintelligible words. OT secured enclosure bed and pt missing 29 minutes of skilled OT intervention.     Alen BleacherBradsher, Melrose Kearse P 06/02/2017, 3:41 PM

## 2017-06-02 NOTE — Progress Notes (Signed)
[facility] Individual Statement of Services  Patient Name:  Anthony Singleton  Date:  06/02/2017  Welcome to the [facility].  Our goal is to provide you with an individualized program based on your diagnosis and situation, designed to meet your specific needs.  With this comprehensive rehabilitation program, you will be expected to participate in at least 3 hours of rehabilitation therapies Monday-Friday, with modified therapy programming on the weekends.  Your rehabilitation program will include the following services:  Physical Therapy (PT), Occupational Therapy (OT), Speech Therapy (ST), 24 hour per day rehabilitation nursing, Therapeutic Recreaction (TR), Neuropsychology, Case Management (Social Worker), Rehabilitation Medicine, Nutrition Services and Pharmacy Services  Weekly team conferences will be held on Tuesdays to discuss your progress.  Your Social Worker will talk with you frequently to get your input and to update you on team discussions.  Team conferences with you and your family in attendance may also be held.  Expected length of stay:  4 weeks  Overall anticipated outcome:  Supervision overall; min A with ambulation in the community and cognition  Depending on your progress and recovery, your program may change. Your Social Worker will coordinate services and will keep you informed of any changes. Your Social Worker's name and contact numbers are listed  below.  The following services may also be recommended but are not provided by the [facility]:   Driving Evaluations  Home Health Rehabiltiation Services  Outpatient Rehabilitation Services  Vocational Rehabilitation   Arrangements will be made to provide these services after discharge if needed.  Arrangements include referral to agencies that provide these services.  Your insurance has been verified to be:  [employer] Your primary doctor is:   No one listed - Boneta LucksJenny can help you find someone, if you want.  Pertinent information will be shared with your doctor and your insurance company.  Social Worker:  Staci AcostaJenny Matalynn Graff, LCSW  [phone] or [phone]) [phone]  Information discussed with and copy given to patient by: Elvera LennoxPrevatt, Kahle Mcqueen Capps, 06/02/2017, 3:09 PM

## 2017-06-02 NOTE — Progress Notes (Signed)
Subjective/Complaints:  managed to escape enclosure bed yesterday. Complained of headache after getting out. CT ordered and negative for changes. Up most of night  ROS: pt denies nausea, vomiting, diarrhea, cough, shortness of breath or chest pain    Objective: Vital Signs: Blood pressure 109/68, pulse 89, temperature 98 F (36.7 C), temperature source Oral, resp. rate 18, height 6\' 5"  (1.956 m), weight 78.5 kg (173 lb 1 oz), SpO2 100 %. Ct Head Wo Contrast  Result Date: 06/01/2017 CLINICAL DATA:  Headache.  History of traumatic brain injury. EXAM: CT HEAD WITHOUT CONTRAST TECHNIQUE: Contiguous axial images were obtained from the base of the skull through the vertex without intravenous contrast. COMPARISON:  Head CT 05/22/2017 FINDINGS: Brain: Decreased edema at the site of right frontal contusion. No acute hemorrhage. The rest of the brain is normal. No extra-axial collection. Midline shift has resolved. No hydrocephalus or mass effect on the ventricles. Vascular: No hyperdense vessel or unexpected calcification. Skull: Unchanged nondisplaced right frontal skull fracture extending to the superior right orbit. Sinuses/Orbits: No sinus fluid levels or advanced mucosal thickening. No mastoid effusion. Normal orbits. IMPRESSION: Improving right frontal lobe edema without acute hemorrhage. Resolution of midline shift and mass effect on the ventricles. Electronically Signed   By: Deatra Robinson M.D.   On: 06/01/2017 22:48   No results found for this or any previous visit (from the past 72 hour(s)).   HEENT: normal Cardio: RRR without murmur. No JVD   Resp: CTA Bilaterally without wheezes or rales. Normal effort  GI: BS positive and NT, ND Extremity:  Pulses positive and No Edema Skin:   Intact Neuro: alseep in bed. Did not arouse for me.  Musc/Skel:  Other no pain with UE or LE ROM Gen NAD   Assessment/Plan: 1. Functional deficits secondary to TBI skull fracture which require 3+ hours per day  of interdisciplinary therapy in a comprehensive inpatient rehab setting. Physiatrist is providing close team supervision and 24 hour management of active medical problems listed below. Physiatrist and rehab team continue to assess barriers to discharge/monitor patient progress toward functional and medical goals. FIM: Function - Bathing Position: Wheelchair/chair at sink Body parts bathed by patient: Right arm, Left arm, Chest, Abdomen, Front perineal area, Buttocks, Right upper leg, Left upper leg, Right lower leg, Left lower leg Body parts bathed by helper: Back Bathing not applicable: Front perineal area, Buttocks, Right upper leg, Left upper leg, Right lower leg, Left lower leg Assist Level: Touching or steadying assistance(Pt > 75%)  Function- Upper Body Dressing/Undressing What is the patient wearing?: Pull over shirt/dress Pull over shirt/dress - Perfomed by patient: Thread/unthread right sleeve, Thread/unthread left sleeve, Put head through opening, Pull shirt over trunk Assist Level: Supervision or verbal cues Function - Lower Body Dressing/Undressing What is the patient wearing?: Underwear, Pants, Socks, Shoes Position: Wheelchair/chair at [employer] - Performed by patient: Thread/unthread right underwear leg, Thread/unthread left underwear leg, Pull underwear up/down Pants- Performed by patient: Thread/unthread right pants leg, Thread/unthread left pants leg, Pull pants up/down Socks - Performed by patient: Don/doff right sock, Don/doff left sock Shoes - Performed by helper: Don/doff right shoe, Don/doff left shoe, Fasten right, Fasten left Assist for footwear: Partial/moderate assist Assist for lower body dressing: Touching or steadying assistance (Pt > 75%)  Function - Toileting Toileting steps completed by patient: Adjust clothing prior to toileting, Performs perineal hygiene, Adjust clothing after toileting Toileting steps completed by helper: Adjust clothing after  toileting Toileting Assistive Devices: Grab bar or rail Assist  level: Touching or steadying assistance (Pt.75%)  Function - ArchivistToilet Transfers Toilet transfer activity did not occur: Safety/medical concerns Toilet transfer assistive device: Elevated toilet seat/BSC over toilet Assist level to toilet: Touching or steadying assistance (Pt > 75%) Assist level from toilet: Touching or steadying assistance (Pt > 75%)  Function - Chair/bed transfer Chair/bed transfer method: Ambulatory Chair/bed transfer assist level: Touching or steadying assistance (Pt > 75%) Chair/bed transfer assistive device: Armrests Chair/bed transfer details: Verbal cues for precautions/safety  Function - Locomotion: Wheelchair Will patient use wheelchair at discharge?: No Type: Manual Assist Level: Dependent (Pt equals 0%) Assist Level: Dependent (Pt equals 0%) Assist Level: Dependent (Pt equals 0%) Function - Locomotion: Ambulation Ambulation activity did not occur: Safety/medical concerns Assistive device: Hand held assist Assist level: Moderate assist (Pt 50 - 74%) Assist level: Touching or steadying assistance (Pt > 75%) Assist level: Moderate assist (Pt 50 - 74%) Walk 150 feet activity did not occur: Safety/medical concerns Walk 10 feet on uneven surfaces activity did not occur: Safety/medical concerns Assist level: Touching or steadying assistance (Pt > 75%)  Function - Comprehension Comprehension: Auditory Comprehension assist level: Understands basic 25 - 49% of the time/ requires cueing 50 - 75% of the time  Function - Expression Expression: Verbal Expression assist level: Expresses basic 25 - 49% of the time/requires cueing 50 - 75% of the time. Uses single words/gestures.  Function - Social Interaction Social Interaction assist level: Interacts appropriately 25 - 49% of time - Needs frequent redirection.  Function - Problem Solving Problem solving assist level: Solves basic less than 25% of the  time - needs direction nearly all the time or does not effectively solve problems and may need a restraint for safety  Function - Memory Memory assist level: Recognizes or recalls less than 25% of the time/requires cueing greater than 75% of the time Patient normally able to recall (first 3 days only): That he or she is in a hospital  Medical Problem List and Plan: 1.  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  DVT Prophylaxis/Anticoagulation: Mechanical: Sequential compression devices, below knee Bilateral lower extremities 3. Headaches/ Pain Management: tylenol   -consider trial of depakote or topamax 4. Mood: Monitor as mentation improves. LCSW to follow along for evaluation and support when appropriate.  5. Neuropsych: This patient is not capable of making decisions on his own behalf.   -trial of ritalin to improve attention   -wife will stay at home tonight (she feels she is a distraction) 6. Skin/Wound Care: routine pressure relief measures.  7. Fluids/Electrolytes/Nutrition: Monitor I/O. BMET normal 1/5  8. Seizures: Continue Keppra bid.  9. Dysphagia: Monitor oral intake--continue tube feeds due to fluctuating mentation.  10. HTN: Monitor qid for now. Continue catapres TTS 2 today.  11. Tachycardia: Resting heart rate improved. Will monitor HR qid. Continue metoprolol 100 mg bid.likely related to TBI catecholamine release although ,may be on dry side given poor oral fluid intake howver BMET nl 1/5 Vitals:   06/02/17 0500 06/02/17 0700  BP: 119/80 109/68  Pulse: 89   Resp: 18   Temp: 98 F (36.7 C)   SpO2: 100%      12. ABLA:Improved Hgb 12.1 Add multivitamin with iron.   13.  Sleep               -  sleep-wake chart needed             -Decrease am Seroquel dose to 75 mg bid   200 mg at 8pm,    -add trazodone tonight also 14.  Hypoalbuminemia addded prostat 15.  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 Per reports--tinnitus was worsening as well as problems with equilibrium since thanksgiving-question Meniere's disease.    16.  C Spine CT negative   LOS (Days) 4 A FACE TO FACE EVALUATION WAS PERFORMED  Meliss Fleek T 06/02/2017, 9:09 AM

## 2017-06-02 NOTE — Progress Notes (Signed)
Physical Therapy Session Note  Patient Details  Name: Anthony Singleton MRN: [id number] Date of Birth: [birth year]  Today's Date: 06/02/2017 PT Individual Time: 1600-1630 PT Individual Time Calculation (min): 30 min   Short Term Goals: Week 1:  PT Short Term Goal 1 (Week 1): Pt will locate items in L visual field in 50% of opportunities without cues PT Short Term Goal 2 (Week 1): Pt will transfer with min assist and LRAD PT Short Term Goal 3 (Week 1): Pt will ambulate 13' with LRAD and mod assist PT Short Term Goal 4 (Week 1): Pt will initiate stair training with PT PT Short Term Goal 5 (Week 1): Pt will identify 1 impairment with mod question cues.    Skilled Therapeutic Interventions/Progress Updates:    Patient in supine mumbling about a good friend from the past.  Able to engage in getting on socks and shoes in supine.  Supine to sit S.  Sit to stand min A for safety. Ambulated to therapy gym about 130' mod A with HHA due to broad based gait, occasional scissoring with ataxic quality and decreased tolerance c/o dizziness, but distracted due to confabulation about his friend.  Attempted to engage in vestibular rehab with standing visualizing stationary target, but pt too distracted.  Seated shoulder flexion lifting weighted ball overhead for sitting balance, improved core strengthening.  Patient supine on mat per preference.  Increased time to engage in returning upright and ambulating to room which he did after about 7 minutes of redirection.  Patient reaching for walls with continued ataxia with gait.  Left in supine in enclosure bed with needs met.   Therapy Documentation Precautions:  Precautions Precautions: Fall, Cervical Precaution Comments: monitor vitals Required Braces or Orthoses: Cervical Brace Cervical Brace: Hard collar, At all times Restrictions Weight Bearing Restrictions: No Pain: Pain Assessment Pain Assessment: No/denies pain   See Function Navigator for  Current Functional Status.   Therapy/Group: Individual Therapy  Reginia Naas 06/02/2017, 5:25 PM

## 2017-06-02 NOTE — Progress Notes (Signed)
Speech Language Pathology Daily Session Note  Patient Details  Name: Anthony Singleton MRN: [id number] Date of Birth: Aug 31, 1970  Today's Date: 06/02/2017 SLP Individual Time: 1300-1355 SLP Individual Time Calculation (min): 55 min  Short Term Goals: Week 1: SLP Short Term Goal 1 (Week 1): Patient will consume current diet with minimal overt s/s of aspiration with Mod A verbal cues for use of swallowing compensatory strategies.  SLP Short Term Goal 2 (Week 1): Patient will consume trials of thin liquids via cup with minimal overt s/s of aspiration and Mod A verbal cues for use of strategies over 3 sessions prior to upgrade.  SLP Short Term Goal 3 (Week 1): Patient will demonstrate sustained attention to a functional task for ~10 minutes with Mod A verbal cues for redirection.  SLP Short Term Goal 4 (Week 1): Patient will verbalize 1 cognitive and 1 physical deficit with Max A verbal cues.  SLP Short Term Goal 5 (Week 1): Patient will demonstrate functional problem solving for basic and familiar tasks with Max A verbal cues.  SLP Short Term Goal 6 (Week 1): Patient will utilize external aids to orient to time, place and situation with supervision verbal cues.   Skilled Therapeutic Interventions: Skilled treatment session focused on dysphagia and cognitive goals. SLP facilitated session by providing skilled observation with lunch meal of Dys. 3 textures with nectar-thick liquids with Mod A verbal cues for use of swallowing compensatory strategies. Recommend patient to continue on current diet. Patient perseverative on his "cousin [name]" and required Mod A verbal cues for attention to self-feeding. Patient also required Mod A verbal cues to complete a basic money management task in regards to problem solving. Patient left supine in enclosure bed at end of session. Continue with current plan of care.      Function:  Eating Eating   Modified Consistency Diet: Yes Eating Assist Level: More  than reasonable amount of time;Set up assist for;Supervision or verbal cues           Cognition Comprehension Comprehension assist level: Understands basic 25 - 49% of the time/ requires cueing 50 - 75% of the time  Expression   Expression assist level: Expresses basic 25 - 49% of the time/requires cueing 50 - 75% of the time. Uses single words/gestures.  Social Interaction Social Interaction assist level: Interacts appropriately 25 - 49% of time - Needs frequent redirection.  Problem Solving Problem solving assist level: Solves basic less than 25% of the time - needs direction nearly all the time or does not effectively solve problems and may need a restraint for safety  Memory Memory assist level: Recognizes or recalls less than 25% of the time/requires cueing greater than 75% of the time    Pain No/Denies Pain   Therapy/Group: Individual Therapy  Anthony Singleton 06/02/2017, 2:35 PM
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
## 2017-06-02 NOTE — Plan of Care (Signed)
Patient continues to require max to total assist for safety and memory. Continue with enclosure bed. Anthony NeerJoyce, [name]

## 2017-06-03 ENCOUNTER — Inpatient Hospital Stay (HOSPITAL_COMMUNITY): Payer: BLUE CROSS/BLUE SHIELD | Admitting: Physical Therapy

## 2017-06-03 ENCOUNTER — Inpatient Hospital Stay (HOSPITAL_COMMUNITY): Payer: BLUE CROSS/BLUE SHIELD | Admitting: Speech Pathology

## 2017-06-03 ENCOUNTER — Inpatient Hospital Stay (HOSPITAL_COMMUNITY): Payer: BLUE CROSS/BLUE SHIELD | Admitting: Occupational Therapy

## 2017-06-03 MED ORDER — QUETIAPINE FUMARATE 50 MG PO TABS
50.0000 mg | ORAL_TABLET | Freq: Two times a day (BID) | ORAL | Status: DC
  Administered 2017-06-03 – 2017-06-08 (×10): 50 mg via ORAL
  Filled 2017-06-03 (×10): qty 1
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
## 2017-06-03 NOTE — Progress Notes (Signed)
Speech Language Pathology Daily Session Note  Patient Details  Name: Anthony Singleton MRN: [id number] Date of Birth: 1971-03-25  Today's Date: 06/03/2017 SLP Individual Time: 0930-1030 SLP Individual Time Calculation (min): 60 min  Short Term Goals: Week 1: SLP Short Term Goal 1 (Week 1): Patient will consume current diet with minimal overt s/s of aspiration with Mod A verbal cues for use of swallowing compensatory strategies.  SLP Short Term Goal 2 (Week 1): Patient will consume trials of thin liquids via cup with minimal overt s/s of aspiration and Mod A verbal cues for use of strategies over 3 sessions prior to upgrade.  SLP Short Term Goal 3 (Week 1): Patient will demonstrate sustained attention to a functional task for ~10 minutes with Mod A verbal cues for redirection.  SLP Short Term Goal 4 (Week 1): Patient will verbalize 1 cognitive and 1 physical deficit with Max A verbal cues.  SLP Short Term Goal 5 (Week 1): Patient will demonstrate functional problem solving for basic and familiar tasks with Max A verbal cues.  SLP Short Term Goal 6 (Week 1): Patient will utilize external aids to orient to time, place and situation with supervision verbal cues.   Skilled Therapeutic Interventions: Skilled treatment session focused on cognitive and dysphagia goals. SLP facilitated session by providing extra time and Max A verbal and tactile cues for arousal and to sit EOB in order to transfer to the wheelchair. Patient performed basic self-care tasks at the sink with Mod I for problem solving but required Max A verbal cues for attention to task due to perseveration on "children, but not his children that were running around." Patient consumed trials of thin liquids via cup with subtle throat clear X 2 out of 16 oz. Recommend trials of thin with current textures prior to upgrade. Patient left supine in enclosure bed with all needs within reach. Continue with current plan of care.       Function:  Eating Eating   Modified Consistency Diet: Yes Eating Assist Level: More than reasonable amount of time;Supervision or verbal cues           Cognition Comprehension Comprehension assist level: Understands basic 50 - 74% of the time/ requires cueing 25 - 49% of the time  Expression   Expression assist level: Expresses basic 25 - 49% of the time/requires cueing 50 - 75% of the time. Uses single words/gestures.  Social Interaction Social Interaction assist level: Interacts appropriately 25 - 49% of time - Needs frequent redirection.  Problem Solving Problem solving assist level: Solves basic less than 25% of the time - needs direction nearly all the time or does not effectively solve problems and may need a restraint for safety  Memory Memory assist level: Recognizes or recalls less than 25% of the time/requires cueing greater than 75% of the time    Pain Pain Assessment Pain Assessment: 0-10 Pain Score: 3  Pain Type: Acute pain Pain Location: Head Pain Descriptors / Indicators: Headache Pain Frequency: Intermittent Pain Onset: Gradual Patients Stated Pain Goal: 0 Pain Intervention(s): Medication (See eMAR)  Therapy/Group: Individual Therapy  Romar Woodrick 06/03/2017, 3:37 PM

## 2017-06-03 NOTE — Progress Notes (Signed)
Subjective/Complaints: He appears to have slept most of the night.  RN reports no problems.  Patient slow to arouse this morning when I came in  ROS: Limited due to cognitive/behavioral   Objective: Vital Signs: Blood pressure 108/70, pulse 88, temperature 98.8 F (37.1 C), temperature source Oral, resp. rate 18, height 6\' 5"  (1.956 m), weight 78.5 kg (173 lb 1 oz), SpO2 98 %. Ct Head Wo Contrast  Result Date: 06/01/2017 CLINICAL DATA:  Headache.  History of traumatic brain injury. EXAM: CT HEAD WITHOUT CONTRAST TECHNIQUE: Contiguous axial images were obtained from the base of the skull through the vertex without intravenous contrast. COMPARISON:  Head CT 05/22/2017 FINDINGS: Brain: Decreased edema at the site of right frontal contusion. No acute hemorrhage. The rest of the brain is normal. No extra-axial collection. Midline shift has resolved. No hydrocephalus or mass effect on the ventricles. Vascular: No hyperdense vessel or unexpected calcification. Skull: Unchanged nondisplaced right frontal skull fracture extending to the superior right orbit. Sinuses/Orbits: No sinus fluid levels or advanced mucosal thickening. No mastoid effusion. Normal orbits. IMPRESSION: Improving right frontal lobe edema without acute hemorrhage. Resolution of midline shift and mass effect on the ventricles. Electronically Signed   By: Deatra RobinsonKevin  Herman M.D.   On: 06/01/2017 22:48   No results found for this or any previous visit (from the past 72 hour(s)).   HEENT: normal Cardio: RRR without murmur. No JVD    Resp: CTA Bilaterally without wheezes or rales. Normal effort   GI: BS positive and NT, ND Extremity:  Pulses positive and No Edema Skin:   Intact Neuro: Arouses to tactile and verbal stimulation.  Moves all fours.  Musc/Skel:  Other no pain with UE or LE ROM Gen NAD   Assessment/Plan: 1. Functional deficits secondary to TBI skull fracture which require 3+ hours per day of interdisciplinary therapy in a  comprehensive inpatient rehab setting. Physiatrist is providing close team supervision and 24 hour management of active medical problems listed below. Physiatrist and rehab team continue to assess barriers to discharge/monitor patient progress toward functional and medical goals. FIM: Function - Bathing Position: Wheelchair/chair at sink Body parts bathed by patient: Right arm, Left arm, Chest, Abdomen, Front perineal area, Buttocks, Right upper leg, Left upper leg, Right lower leg, Left lower leg Body parts bathed by helper: Back Bathing not applicable: Front perineal area, Buttocks, Right upper leg, Left upper leg, Right lower leg, Left lower leg Assist Level: Touching or steadying assistance(Pt > 75%)  Function- Upper Body Dressing/Undressing What is the patient wearing?: Pull over shirt/dress Pull over shirt/dress - Perfomed by patient: Thread/unthread right sleeve, Thread/unthread left sleeve, Put head through opening, Pull shirt over trunk Assist Level: Supervision or verbal cues Function - Lower Body Dressing/Undressing What is the patient wearing?: Underwear, Pants, Socks, Shoes Position: Wheelchair/chair at [employer] Underwear - Performed by patient: Thread/unthread right underwear leg, Thread/unthread left underwear leg, Pull underwear up/down Pants- Performed by patient: Thread/unthread right pants leg, Thread/unthread left pants leg, Pull pants up/down Socks - Performed by patient: Don/doff right sock, Don/doff left sock Shoes - Performed by helper: Don/doff right shoe, Don/doff left shoe, Fasten right, Fasten left Assist for footwear: Partial/moderate assist Assist for lower body dressing: Touching or steadying assistance (Pt > 75%)  Function - Toileting Toileting steps completed by patient: Adjust clothing prior to toileting, Performs perineal hygiene, Adjust clothing after toileting Toileting steps completed by helper: Adjust clothing after toileting Toileting Assistive Devices:  Grab bar or rail Assist level: Touching or  steadying assistance (Pt.75%)  Function - Archivist transfer activity did not occur: Safety/medical concerns Toilet transfer assistive device: Elevated toilet seat/BSC over toilet Assist level to toilet: Touching or steadying assistance (Pt > 75%) Assist level from toilet: Touching or steadying assistance (Pt > 75%)  Function - Chair/bed transfer Chair/bed transfer method: Ambulatory Chair/bed transfer assist level: Touching or steadying assistance (Pt > 75%) Chair/bed transfer assistive device: Armrests Chair/bed transfer details: Verbal cues for technique, Verbal cues for precautions/safety  Function - Locomotion: Wheelchair Will patient use wheelchair at discharge?: No Type: Manual Assist Level: Dependent (Pt equals 0%) Assist Level: Dependent (Pt equals 0%) Assist Level: Dependent (Pt equals 0%) Function - Locomotion: Ambulation Ambulation activity did not occur: Safety/medical concerns Assistive device: Hand held assist Max distance: 130 Assist level: Moderate assist (Pt 50 - 74%) Assist level: Moderate assist (Pt 50 - 74%) Assist level: Moderate assist (Pt 50 - 74%) Walk 150 feet activity did not occur: Safety/medical concerns Assist level: Touching or steadying assistance (Pt > 75%) Walk 10 feet on uneven surfaces activity did not occur: Safety/medical concerns Assist level: Touching or steadying assistance (Pt > 75%)  Function - Comprehension Comprehension: Auditory Comprehension assist level: Understands basic 25 - 49% of the time/ requires cueing 50 - 75% of the time  Function - Expression Expression: Verbal Expression assist level: Expresses basic 25 - 49% of the time/requires cueing 50 - 75% of the time. Uses single words/gestures.  Function - Social Interaction Social Interaction assist level: Interacts appropriately 25 - 49% of time - Needs frequent redirection.  Function - Problem Solving Problem  solving assist level: Solves basic less than 25% of the time - needs direction nearly all the time or does not effectively solve problems and may need a restraint for safety  Function - Memory Memory assist level: Recognizes or recalls less than 25% of the time/requires cueing greater than 75% of the time Patient normally able to recall (first 3 days only): That he or she is in a hospital  Medical Problem List and Plan: 1.  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  DVT Prophylaxis/Anticoagulation: Mechanical: Sequential compression devices, below knee Bilateral lower extremities 3. Headaches/ Pain Management: tylenol   -consider trial of depakote or topamax if persistent 4. Mood: Monitor as mentation improves. LCSW to follow along for evaluation and support when appropriate.  5. Neuropsych: This patient is not capable of making decisions on his own behalf.   - continue ritalin to improve attention day/night cycle   -reduce seroquel 6. Skin/Wound Care: routine pressure relief measures.  7. Fluids/Electrolytes/Nutrition: Monitor I/O. BMET normal 1/5  8. Seizures: Continue Keppra bid.  9. Dysphagia: Monitor oral intake--continue tube feeds due to fluctuating mentation.  10. HTN: Monitor qid for now. Continue catapres TTS 2 today.  11. Tachycardia: Generally still showing some improvement.  Continue to observe for now Vitals:   06/02/17 2000 06/03/17 0644  BP: 112/79 108/70  Pulse: (!) 107 88  Resp:  18  Temp:  98.8 F (37.1 C)  SpO2: 100% 98%     12. ABLA:Improved Hgb 12.1 Added multivitamin with iron.   13.  Sleep: Much better last night             -  sleep-wake chart to continue             -  Decrease am Seroquel dose to 50 mg bid   200 mg at 8pm,    -added trazodone tonight at bedtime 14.  Hypoalbuminemia addded prostat 15. 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 Per  reports--tinnitus was worsening as well as problems with equilibrium since thanksgiving-question Meniere's disease.    16.  C Spine CT negative   LOS (Days) 5 A FACE TO FACE EVALUATION WAS PERFORMED  SWARTZ,ZACHARY T 06/03/2017, 8:32 AM

## 2017-06-04 ENCOUNTER — Inpatient Hospital Stay (HOSPITAL_COMMUNITY): Payer: BLUE CROSS/BLUE SHIELD | Admitting: Physical Therapy

## 2017-06-04 ENCOUNTER — Inpatient Hospital Stay (HOSPITAL_COMMUNITY): Payer: BLUE CROSS/BLUE SHIELD | Admitting: Occupational Therapy

## 2017-06-04 ENCOUNTER — Inpatient Hospital Stay (HOSPITAL_COMMUNITY): Payer: BLUE CROSS/BLUE SHIELD | Admitting: Speech Pathology

## 2017-06-04 NOTE — Progress Notes (Signed)
Social Work Patient ID: Anthony Singleton, male   DOB: 1970/08/23, 47 y.o.   MRN: [id number]   CSW spoke with pt and pt's wife to update them on team conference discussion and targeted LOS of around 4 weeks, but that team did not feel prepared to set a d/c date at this time.  Wife expressed understanding and is pleased that pt is on CIR and with care thus far.  CSW also discussed with wife removing any weapons in the home which pt could have access to and she stated that pt's cousin was already helping her with that.  She has already thought about a lot of preparation for pt's return home.  CSW will continue to support pt/family as needed.

## 2017-06-04 NOTE — Progress Notes (Signed)
Physical Therapy Session Note  Patient Details  Name: Anthony Singleton XXXWilliams MRN: [id number] Date of Birth: [birth year]  Today's Date: 06/04/2017 PT Individual Time: 0806-0901 PT Individual Time Calculation (min): 55 min   Short Term Goals: Week 1:  PT Short Term Goal 1 (Week 1): Pt will locate items in L visual field in 50% of opportunities without cues PT Short Term Goal 2 (Week 1): Pt will transfer with min assist and LRAD PT Short Term Goal 3 (Week 1): Pt will ambulate 7950' with LRAD and mod assist PT Short Term Goal 4 (Week 1): Pt will initiate stair training with PT PT Short Term Goal 5 (Week 1): Pt will identify 1 impairment with mod question cues.    Skilled Therapeutic Interventions/Progress Updates:  Pt received in room, consuming breakfast with wife present. Therapist provided supervision while pt consumed remaining breakfast while engaging in conversation. Pt able to recall therapist's name and activities from yesterday's session without assistance. Pt donned socks & shoes with set up assist. Pt ambulated room>chairs by elevators>gym>room without AD and min assist for balance with slightly improved gait pattern as compared to yesterday. Pt negotiated 24 steps (6" + 3") with B rails and min assist for balance. [name] reports they have 3 steps to enter home with R rail and can hold to wall on L side. Pt utilized cybex kinetron in standing with BUE support up to 40 cm/sec for 30 second bouts x 3 trials. Pt requires seated rest breaks in between task 2/2 fatigue. Throughout session pt continued to c/o dizziness and would benefit from vestibular eval. Pt able to pathfind back to room after therapist educated him on correct room number. At end of session pt left in enclosure bed with all needs within reach & wife present in room.    Therapy Documentation Precautions:  Precautions Precautions: Fall, Cervical Precaution Comments: monitor vitals Required Braces or Orthoses: Cervical  Brace Cervical Brace: Hard collar, At all times Restrictions Weight Bearing Restrictions: No  Pain:   B shoulder & neck pain reported - pt & wife report he has already received pain meds this AM but meds requested at end of session.  See Function Navigator for Current Functional Status.   Therapy/Group: Individual Therapy  Sandi MariscalVictoria M Miller 06/04/2017, 9:15 AM

## 2017-06-04 NOTE — Progress Notes (Signed)
Physical Therapy Weekly Progress Note  Patient Details  Name: Anthony Singleton MRN: [id number] Date of Birth: Mar 04, 1971  Beginning of progress report period: May 30, 2017 End of progress report period: June 05, 2017  Today's Date: 06/04/2017   Patient has met 5 of 5 short term goals.  Pt is making good progress towards long term goals and is now able to ambulate ~100 ft at a time without AD and min assist. Pt continues to require encouragement for participation. Pt continues to report dizziness with change in position and movement and would benefit from a vestibular eval. Pt would benefit from continued skilled PT treatment to focus on cognitive remediation, balance, NMR, strength & endurance training, and to increase independence with functional mobility.  Patient continues to demonstrate the following deficits muscle weakness, decreased cardiorespiratoy endurance, decreased coordination, decreased initiation, decreased attention, decreased awareness, decreased problem solving, decreased safety awareness, decreased memory and delayed processing, and decreased standing balance, decreased postural control and decreased balance strategies and therefore will continue to benefit from skilled PT intervention to increase functional independence with mobility.  Patient progressing toward long term goals..  Continue plan of care.  PT Short Term Goals Week 1:  PT Short Term Goal 1 (Week 1): Pt will locate items in L visual field in 50% of opportunities without cues PT Short Term Goal 1 - Progress (Week 1): Met PT Short Term Goal 2 (Week 1): Pt will transfer with min assist and LRAD PT Short Term Goal 2 - Progress (Week 1): Met PT Short Term Goal 3 (Week 1): Pt will ambulate 36' with LRAD and mod assist PT Short Term Goal 3 - Progress (Week 1): Met PT Short Term Goal 4 (Week 1): Pt will initiate stair training with PT PT Short Term Goal 4 - Progress (Week 1): Met PT Short Term Goal 5  (Week 1): Pt will identify 1 impairment with mod question cues.   PT Short Term Goal 5 - Progress (Week 1): Met Week 2:  PT Short Term Goal 1 (Week 2): Pt will negotiate 4 steps with R rail only with min assist.  PT Short Term Goal 2 (Week 2): Pt will ambulate 50 ft with supervision assist with LRAD.   Therapy Documentation Precautions:  Precautions Precautions: Fall Precaution Comments: monitor vitals Weight Bearing Restrictions: No   See Function Navigator for Current Functional Status.  Therapy/Group: Individual Therapy  Waunita Schooner 06/04/2017, 7:58 AM

## 2017-06-04 NOTE — Progress Notes (Signed)
Occupational Therapy Session Note  Patient Details  Name: Anthony Singleton MRN: [id number] Date of Birth: July 05, 1970  Today's Date: 06/04/2017 OT Individual Time: [id number] OT Individual Time Calculation (min): 75 min    Short Term Goals: Week 1:  OT Short Term Goal 1 (Week 1): Pt will stand at sink wiht min A to groom  OT Short Term Goal 2 (Week 1): Pt will sequence bathing body parts wiht min VC OT Short Term Goal 3 (Week 1): Pt will initiate clothing mangment after toileting with no more than 1 VC OT Short Term Goal 4 (Week 1): Pt will attend to funtional task for 3 min and min VC  Skilled Therapeutic Interventions/Progress Updates:    Upon entering the room, pt supine in enclosure bed sleeping soundly. Pt requiring maximal encouragement to exit the bed. Pt sitting on EOB and puts on B shoes and ties them with min cues for task. Pt ambulating in room with close supervision and standing at the RN station for 7 minutes while making nectar thick chocolate drink with overall supervision. Pt carrying drink and continues to ambulate 150' to ADL apartment with steady assistance with longer distance and no use of AD. Pt taking seated rest break in recliner chair with overall supervision for furniture transfer. Pt navigating back to room with min verbal guidance cues to locate room. Pt returning to enclosure bed with call bell within reach. Bed secured upon exiting the room.   Therapy Documentation Precautions:  Precautions Precautions: Fall, Cervical Precaution Comments: monitor vitals Required Braces or Orthoses: Cervical Brace Cervical Brace: Hard collar, At all times Restrictions Weight Bearing Restrictions: No    Pain: Pain Assessment Pain Assessment: 0-10 Pain Score: 2  Faces Pain Scale: Hurts whole lot Pain Type: Acute pain Pain Location: Shoulder Pain Orientation: Right Pain Descriptors / Indicators: Aching Pain Frequency: Occasional Pain Onset: On-going Pain  Intervention(s): Medication (See eMAR)  See Function Navigator for Current Functional Status.   Therapy/Group: Individual Therapy  Alen BleacherBradsher, Marsella Suman P 06/04/2017, 4:28 PM

## 2017-06-04 NOTE — Progress Notes (Addendum)
Subjective/Complaints: Slept well again last night.  Getting up with PT when I entered into the room.  A bit restless and impulsive.   ROS: pt denies nausea, vomiting, diarrhea, cough, shortness of breath or chest pain   Objective: Vital Signs: Blood pressure 95/68, pulse 92, temperature 98.3 F (36.8 C), temperature source Oral, resp. rate 18, height 6\' 5"  (1.956 m), weight 78.5 kg (173 lb 1 oz), SpO2 100 %. No results found. No results found for this or any previous visit (from the past 72 hour(s)).   HEENT: normal Cardio: RRR without murmur. No JVD     Resp: CTA Bilaterally without wheezes or rales. Normal effort   GI: BS positive and NT, ND Extremity:  Pulses positive and No Edema Skin:   Intact Neuro: More alert.  Oriented to place and reason he is here.  Recognizes his wife.  Moves all fours.  Musc/Skel: No pain with range of motion or activity this morning Gen NAD   Assessment/Plan: 1. Functional deficits secondary to TBI skull fracture which require 3+ hours per day of interdisciplinary therapy in a comprehensive inpatient rehab setting. Physiatrist is providing close team supervision and 24 hour management of active medical problems listed below. Physiatrist and rehab team continue to assess barriers to discharge/monitor patient progress toward functional and medical goals. FIM: Function - Bathing Position: Shower Body parts bathed by patient: Right arm, Left arm, Chest, Abdomen, Front perineal area, Buttocks, Right upper leg, Left upper leg, Right lower leg, Left lower leg Body parts bathed by helper: Back Bathing not applicable: Front perineal area, Buttocks, Right upper leg, Left upper leg, Right lower leg, Left lower leg Assist Level: Touching or steadying assistance(Pt > 75%)  Function- Upper Body Dressing/Undressing What is the patient wearing?: Pull over shirt/dress Pull over shirt/dress - Perfomed by patient: Thread/unthread right sleeve, Thread/unthread left  sleeve, Put head through opening, Pull shirt over trunk Assist Level: Set up, Supervision or verbal cues Set up : To obtain clothing/put away Function - Lower Body Dressing/Undressing What is the patient wearing?: Underwear, Pants Position: Other (comment)(sitting on commode) Underwear - Performed by patient: Thread/unthread right underwear leg, Thread/unthread left underwear leg, Pull underwear up/down Pants- Performed by patient: Thread/unthread right pants leg, Thread/unthread left pants leg, Pull pants up/down Socks - Performed by patient: Don/doff right sock, Don/doff left sock Shoes - Performed by helper: Don/doff right shoe, Don/doff left shoe, Fasten right, Fasten left Assist for footwear: Supervision/touching assist Assist for lower body dressing: Touching or steadying assistance (Pt > 75%)  Function - Toileting Toileting steps completed by patient: Adjust clothing prior to toileting, Performs perineal hygiene, Adjust clothing after toileting Toileting steps completed by helper: Adjust clothing after toileting Toileting Assistive Devices: Grab bar or rail Assist level: Touching or steadying assistance (Pt.75%)  Function - Archivist transfer activity did not occur: Safety/medical concerns Toilet transfer assistive device: Elevated toilet seat/BSC over toilet Assist level to toilet: Touching or steadying assistance (Pt > 75%) Assist level from toilet: Touching or steadying assistance (Pt > 75%)  Function - Chair/bed transfer Chair/bed transfer method: Ambulatory Chair/bed transfer assist level: Touching or steadying assistance (Pt > 75%) Chair/bed transfer assistive device: Armrests Chair/bed transfer details: Verbal cues for technique, Verbal cues for precautions/safety  Function - Locomotion: Wheelchair Will patient use wheelchair at discharge?: No Type: Manual Assist Level: Dependent (Pt equals 0%) Assist Level: Dependent (Pt equals 0%) Assist Level:  Dependent (Pt equals 0%) Function - Locomotion: Ambulation Ambulation activity did not occur: Safety/medical  concerns Assistive device: No device Max distance: 100 ft Assist level: Touching or steadying assistance (Pt > 75%) Assist level: Touching or steadying assistance (Pt > 75%) Assist level: Touching or steadying assistance (Pt > 75%) Walk 150 feet activity did not occur: Safety/medical concerns Assist level: Touching or steadying assistance (Pt > 75%) Walk 10 feet on uneven surfaces activity did not occur: Safety/medical concerns Assist level: Touching or steadying assistance (Pt > 75%)  Function - Comprehension Comprehension: Auditory Comprehension assist level: Understands basic 50 - 74% of the time/ requires cueing 25 - 49% of the time  Function - Expression Expression: Verbal Expression assist level: Expresses basic 25 - 49% of the time/requires cueing 50 - 75% of the time. Uses single words/gestures.  Function - Social Interaction Social Interaction assist level: Interacts appropriately 25 - 49% of time - Needs frequent redirection.  Function - Problem Solving Problem solving assist level: Solves basic less than 25% of the time - needs direction nearly all the time or does not effectively solve problems and may need a restraint for safety  Function - Memory Memory assist level: Recognizes or recalls less than 25% of the time/requires cueing greater than 75% of the time Patient normally able to recall (first 3 days only): That he or she is in a hospital  Medical Problem List and Plan: 1.  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  DVT Prophylaxis/Anticoagulation: Mechanical: Sequential compression devices, below knee Bilateral lower extremities 3. Headaches/ Pain Management: tylenol   -Headaches appear improved.  No complaints of pain today 4. Mood: Monitor as mentation improves. LCSW  to follow along for evaluation and support when appropriate.  5. Neuropsych: This patient is not capable of making decisions on his own behalf.   - continue ritalin to improve attention day/night cycle   -reduced daytime Seroquel   -Continues to require enclosure bed due to safety issues 6. Skin/Wound Care: routine pressure relief measures.  7. Fluids/Electrolytes/Nutrition: Monitor I/O. BMET normal 1/5  8. Seizures: Continue Keppra bid.  9. Dysphagia: Patient on D3 nectar thick liquid diet.  Encourage appropriate swallowing strategies..  10. HTN: Monitor qid for now. Continue catapres TTS 2 today.  11. Tachycardia: Generally still showing some improvement.  Continue to observe for now Vitals:   06/03/17 2100 06/04/17 0633  BP: 95/68   Pulse: 92   Resp: 18   Temp:  98.3 F (36.8 C)  SpO2: 100% 100%     12. ABLA:Improved Hgb 12.1 Added multivitamin with iron.   13.  Sleep: Improving             -  sleep-wake chart to continue             -Decreased am Seroquel dose to 50 mg bid   200 mg at 8pm,    -Continue trazodone at bedtime 14.  Hypoalbuminemia addded prostat 15. 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 Per reports--tinnitus was worsening as well as problems with equilibrium since thanksgiving-question Meniere's disease.    16.  C Spine CT negative   LOS (Days) 6 A FACE TO FACE EVALUATION WAS PERFORMED  Dorella Laster T 06/04/2017, 9:40 AM

## 2017-06-04 NOTE — Progress Notes (Signed)
Physical Therapy Session Note  Patient Details  Name: Anthony Singleton MRN: [id number] Date of Birth: 12-Mar-1971  Today's Date: 06/04/2017 PT Individual Time: 1405-1500 PT Individual Time Calculation (min): 55 min   Short Term Goals: Week 2:  PT Short Term Goal 1 (Week 2): Pt will negotiate 4 steps with R rail only with min assist.  PT Short Term Goal 2 (Week 2): Pt will ambulate 50 ft with supervision assist with LRAD.  Skilled Therapeutic Interventions/Progress Updates:   Pt received supine in bed and agreeable to PT. Supine>sit transfer with supervision assist. RN present to administer pain medication for shoulder.    Gait through hall with min assist from Pt to prevent R lateral LOB; 177f, 155f 14080fnd 7f56ft noted to lean on furniture and on wall with increased distance and reports dizziness with c/o room spinning throughout gait training.   Standing balance and dual tasking on [employer]. A x 1 min average reaction time 1.3 sec , A with T scope x 1 min with only 1 missed word and average reaction time of 1.4 seconds. and B x 1 min with RUE to Red lights and LUE to Green lights; 5 mistaken light pressed with inappropriate hand, and min cues for retention of instruction throughout B program; average reaction time 2.3 sec.   Nustep sustained attention task x 8 minutes. Pt instructed by PT to reduce speed and prevent increased fatigue, but no change in speed noted. Pt sustained steps per min >90 throughout exercises.    Throughout treatment pt required redirection to return attention to task rather than perseverate on how his boss would hate to see him in this therapy location, and the his friend could and would help him break out if he asked. Able to be redirected easily 75% of the time.   Pt returned to room and performed ambulatory transfer to enclosure bed with min assist; max cues for redirection, as pt initially irritated to return to enclosure bed. Sit>supine  completed with supervision assist, and pt left supine in bed with call bell in reach and all needs met.          Therapy Documentation Precautions:  Precautions Precautions: Fall, Cervical Precaution Comments: monitor vitals Required Braces or Orthoses: Cervical Brace Cervical Brace: Hard collar, At all times Restrictions Weight Bearing Restrictions: No Pain: Pain Assessment Pain Assessment: 0-10 Pain Score: 2  Faces Pain Scale: Hurts whole lot Pain Type: Acute pain Pain Location: Shoulder Pain Orientation: Right Pain Descriptors / Indicators: Aching Pain Frequency: Occasional Pain Onset: On-going Pain Intervention(s): Medication (See eMAR)   See Function Navigator for Current Functional Status.   Therapy/Group: Individual Therapy  AustLorie Phenix0/2019, 5:30 PM
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
## 2017-06-04 NOTE — Progress Notes (Signed)
Speech Language Pathology Daily Session Note  Patient Details  Name: Anthony Singleton MRN: [id number] Date of Birth: 1971-03-08  Today's Date: 06/04/2017 SLP Individual Time: 1130-1200 SLP Individual Time Calculation (min): 30 min  Short Term Goals: Week 1: SLP Short Term Goal 1 (Week 1): Patient will consume current diet with minimal overt s/s of aspiration with Mod A verbal cues for use of swallowing compensatory strategies.  SLP Short Term Goal 2 (Week 1): Patient will consume trials of thin liquids via cup with minimal overt s/s of aspiration and Mod A verbal cues for use of strategies over 3 sessions prior to upgrade.  SLP Short Term Goal 3 (Week 1): Patient will demonstrate sustained attention to a functional task for ~10 minutes with Mod A verbal cues for redirection.  SLP Short Term Goal 4 (Week 1): Patient will verbalize 1 cognitive and 1 physical deficit with Max A verbal cues.  SLP Short Term Goal 5 (Week 1): Patient will demonstrate functional problem solving for basic and familiar tasks with Max A verbal cues.  SLP Short Term Goal 6 (Week 1): Patient will utilize external aids to orient to time, place and situation with supervision verbal cues.   Skilled Therapeutic Interventions: Skilled treatment session focused on cognitive and dysphagia goals. Patient required extra time and encouragement to sit EOB in order to consume his trial tray of Dys. 3 textures with thin liquids. Patient consumed meal with subtle throat clear X 2, suspect due to mixed consistencies. Recommend patient upgrade to thin liquids via cup with continued full supervision. Patient less verbose this session and demonstrated increased attention to self-feeding. Patient left supine in enclosure bed. Continue with current plan of care.      Function:   Cognition Comprehension Comprehension assist level: Understands basic 50 - 74% of the time/ requires cueing 25 - 49% of the time  Expression   Expression  assist level: Expresses basic 25 - 49% of the time/requires cueing 50 - 75% of the time. Uses single words/gestures.  Social Interaction Social Interaction assist level: Interacts appropriately 50 - 74% of the time - May be physically or verbally inappropriate.  Problem Solving Problem solving assist level: Solves basic less than 25% of the time - needs direction nearly all the time or does not effectively solve problems and may need a restraint for safety  Memory Memory assist level: Recognizes or recalls less than 25% of the time/requires cueing greater than 75% of the time    Pain Pain Assessment Pain Assessment: 0-10 Pain Score: 2  Faces Pain Scale: Hurts whole lot Pain Type: Acute pain Pain Location: Shoulder Pain Orientation: Right Pain Descriptors / Indicators: Aching Pain Frequency: Occasional Pain Onset: On-going Pain Intervention(s): Medication (See eMAR)  Therapy/Group: Individual Therapy  Ayannah Faddis 06/04/2017, 3:57 PM

## 2017-06-05 ENCOUNTER — Inpatient Hospital Stay (HOSPITAL_COMMUNITY): Payer: BLUE CROSS/BLUE SHIELD | Admitting: Physical Therapy

## 2017-06-05 ENCOUNTER — Inpatient Hospital Stay (HOSPITAL_COMMUNITY): Payer: BLUE CROSS/BLUE SHIELD

## 2017-06-05 ENCOUNTER — Inpatient Hospital Stay (HOSPITAL_COMMUNITY): Payer: BLUE CROSS/BLUE SHIELD | Admitting: Occupational Therapy

## 2017-06-05 ENCOUNTER — Inpatient Hospital Stay (HOSPITAL_COMMUNITY): Payer: BLUE CROSS/BLUE SHIELD | Admitting: Speech Pathology

## 2017-06-05 LAB — BASIC METABOLIC PANEL
Anion gap: 8 (ref 5–15)
BUN: 14 mg/dL (ref 6–20)
CHLORIDE: 103 mmol/L (ref 101–111)
CO2: 27 mmol/L (ref 22–32)
CREATININE: 0.71 mg/dL (ref 0.61–1.24)
Calcium: 9 mg/dL (ref 8.9–10.3)
GFR calc Af Amer: >60 mL/min (ref >60)
GFR calc non Af Amer: >60 mL/min (ref >60)
Glucose, Bld: 87 mg/dL (ref 65–99)
POTASSIUM: 4 mmol/L (ref 3.5–5.1)
SODIUM: 138 mmol/L (ref 135–145)

## 2017-06-05 MED ORDER — MUSCLE RUB 10-15 % EX CREA
TOPICAL_CREAM | Freq: Four times a day (QID) | CUTANEOUS | Status: DC
  Administered 2017-06-05 – 2017-06-12 (×15): via TOPICAL
  Filled 2017-06-05: qty 85

## 2017-06-05 MED ORDER — TROLAMINE SALICYLATE 10 % EX CREA
TOPICAL_CREAM | Freq: Three times a day (TID) | CUTANEOUS | Status: DC
  Filled 2017-06-05: qty 85

## 2017-06-05 NOTE — Progress Notes (Signed)
Subjective/Complaints: Patient up at edge of bed with nurse tech.  States he is having some right shoulder pain.  Therapy noticed pain yesterday as well.  Denies headache.  ROS: pt denies nausea, vomiting, diarrhea, cough, shortness of breath or chest pain   Objective: Vital Signs: Blood pressure (!) 98/59, pulse 96, temperature 98.6 F (37 C), temperature source Oral, resp. rate 18, height 6\' 5"  (1.956 m), weight 78.5 kg (173 lb 1 oz), SpO2 94 %. No results found. No results found for this or any previous visit (from the past 72 hour(s)).   HEENT: normal Cardio: RRR without murmur. No JVD      Resp: CTA Bilaterally without wheezes or rales. Normal effort   GI: BS positive and NT, ND Extremity:  Pulses positive and No Edema Skin:   Intact Neuro: Alert and oriented to person and place    Moves all fours.  Musc/Skel: Right shoulder with tenderness to palpation over humeral head and acromion as well as trapezius and perhaps even into the right clavicle, fairly nonspecific.  Patient able to lift arm over his head and touch his head. Gen NAD   Assessment/Plan: 1. Functional deficits secondary to TBI skull fracture which require 3+ hours per day of interdisciplinary therapy in a comprehensive inpatient rehab setting. Physiatrist is providing close team supervision and 24 hour management of active medical problems listed below. Physiatrist and rehab team continue to assess barriers to discharge/monitor patient progress toward functional and medical goals. FIM: Function - Bathing Position: Shower Body parts bathed by patient: Right arm, Left arm, Chest, Abdomen, Front perineal area, Buttocks, Right upper leg, Left upper leg, Right lower leg, Left lower leg Body parts bathed by helper: Back Bathing not applicable: Front perineal area, Buttocks, Right upper leg, Left upper leg, Right lower leg, Left lower leg Assist Level: Touching or steadying assistance(Pt > 75%)  Function- Upper Body  Dressing/Undressing What is the patient wearing?: Pull over shirt/dress Pull over shirt/dress - Perfomed by patient: Thread/unthread right sleeve, Thread/unthread left sleeve, Put head through opening, Pull shirt over trunk Assist Level: Set up, Supervision or verbal cues Set up : To obtain clothing/put away Function - Lower Body Dressing/Undressing What is the patient wearing?: Underwear, Pants Position: Other (comment)(sitting on commode) Underwear - Performed by patient: Thread/unthread right underwear leg, Thread/unthread left underwear leg, Pull underwear up/down Pants- Performed by patient: Thread/unthread right pants leg, Thread/unthread left pants leg, Pull pants up/down Socks - Performed by patient: Don/doff right sock, Don/doff left sock Shoes - Performed by helper: Don/doff right shoe, Don/doff left shoe, Fasten right, Fasten left Assist for footwear: Supervision/touching assist Assist for lower body dressing: Touching or steadying assistance (Pt > 75%)  Function - Toileting Toileting steps completed by patient: Adjust clothing prior to toileting, Performs perineal hygiene, Adjust clothing after toileting Toileting steps completed by helper: Adjust clothing after toileting Toileting Assistive Devices: Grab bar or rail Assist level: Touching or steadying assistance (Pt.75%)  Function - ArchivistToilet Transfers Toilet transfer activity did not occur: Safety/medical concerns Toilet transfer assistive device: Elevated toilet seat/BSC over toilet Assist level to toilet: Touching or steadying assistance (Pt > 75%) Assist level from toilet: Touching or steadying assistance (Pt > 75%)  Function - Chair/bed transfer Chair/bed transfer method: Ambulatory Chair/bed transfer assist level: Supervision or verbal cues Chair/bed transfer assistive device: Armrests Chair/bed transfer details: Verbal cues for technique, Verbal cues for precautions/safety  Function - Locomotion: Wheelchair Will  patient use wheelchair at discharge?: No Type: Manual Assist Level: Dependent (  Pt equals 0%) Assist Level: Dependent (Pt equals 0%) Assist Level: Dependent (Pt equals 0%) Function - Locomotion: Ambulation Ambulation activity did not occur: Safety/medical concerns Assistive device: No device Max distance: 150' Assist level: Touching or steadying assistance (Pt > 75%) Assist level: Supervision or verbal cues Assist level: Touching or steadying assistance (Pt > 75%) Walk 150 feet activity did not occur: Safety/medical concerns Assist level: Touching or steadying assistance (Pt > 75%) Walk 10 feet on uneven surfaces activity did not occur: Safety/medical concerns Assist level: Touching or steadying assistance (Pt > 75%)  Function - Comprehension Comprehension: Auditory Comprehension assist level: Understands basic 50 - 74% of the time/ requires cueing 25 - 49% of the time  Function - Expression Expression: Verbal Expression assist level: Expresses basic 25 - 49% of the time/requires cueing 50 - 75% of the time. Uses single words/gestures.  Function - Social Interaction Social Interaction assist level: Interacts appropriately 50 - 74% of the time - May be physically or verbally inappropriate.  Function - Problem Solving Problem solving assist level: Solves basic less than 25% of the time - needs direction nearly all the time or does not effectively solve problems and may need a restraint for safety  Function - Memory Memory assist level: Recognizes or recalls less than 25% of the time/requires cueing greater than 75% of the time Patient normally able to recall (first 3 days only): That he or she is in a hospital  Medical Problem List and Plan: 1.  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  DVT Prophylaxis/Anticoagulation: Mechanical: Sequential compression devices, below knee Bilateral lower  extremities 3. Headaches/ Pain Management: tylenol   -Headaches appear improved.      -We will check x-ray of right shoulder to rule out occult fracture.  Add muscle rub for muscular component of pain 4. Mood: Monitor as mentation improves. LCSW to follow along for evaluation and support when appropriate.  5. Neuropsych: This patient is not capable of making decisions on his own behalf.   - continue ritalin to improve attention day/night cycle--positive results so far   -Wean daytime Seroquel as appropriate   -Continues to require enclosure bed due to safety issues 6. Skin/Wound Care: routine pressure relief measures.  7. Fluids/Electrolytes/Nutrition: Encourage p.o. Intake   -Follow-up labs pending for today 8. Seizures: Continue Keppra bid.  9. Dysphagia: Patient on D3 nectar thick liquid diet.  Encourage appropriate swallowing strategies..  10. HTN: Monitor qid for now. Continue catapres TTS 2 today.  11. Tachycardia: Improved, continues on metoprolol 100 mg twice daily Vitals:   06/05/17 0338 06/05/17 0809  BP: 97/69 (!) 98/59  Pulse: 80 96  Resp: 18 18  Temp: 98.6 F (37 C)   SpO2: 94%      12. ABLA:Improved Hgb 12.1 Added multivitamin with iron.   13.  Sleep: Improving             -  sleep-wake chart to continue             -Decreased am Seroquel dose to 50 mg bid   200 mg at 8pm,    -Continue trazodone at bedtime 14.  Hypoalbuminemia addded prostat 15. 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 Per reports--tinnitus was worsening as well as problems with equilibrium since thanksgiving-question Meniere's disease.    16.  C Spine CT negative   LOS (Days) 7 A FACE TO FACE EVALUATION WAS PERFORMED  Chanise Habeck T 06/05/2017, 9:04 AM

## 2017-06-05 NOTE — Progress Notes (Signed)
Speech Language Pathology Make-up Session Note  Patient Details  Name: Anthony Singleton Dwayne XXXWilliams MRN: [id number] Date of Birth: 01-06-1971  Today's Date: 06/05/2017 SLP Individual Time: 1000-1030 SLP Individual Time Calculation (min): 30 min  Short Term Goals: Week 2: SLP Short Term Goal 1 (Week 2): Patient will consume current diet with minimal overt s/s of aspiration with Min A verbal cues for use of swallowing compensatory strategies.  SLP Short Term Goal 2 (Week 2): Patient will demonstrate efficient mastication without overt s/s of aspiration with regular textures over 2 sessions prior to upgrade.  SLP Short Term Goal 3 (Week 2): Patient will demonstrate functional problem solving for basic and familiar tasks with Max A verbal cues.  SLP Short Term Goal 4 (Week 2): Patient will verbalize 1 cognitive and 1 physical deficit with Mod A verbal cues.  SLP Short Term Goal 5 (Week 2): Patient will demonstrate sustained attention to a functional task for ~10 minutes with Min A verbal cues for redirection.   Skilled Therapeutic Interventions: Skilled treatment session focused on dysphagia and cognitive goals. SLP facilitated session by providing trials of regular textures. Patient demonstrated efficient mastication with complete oral clearance without overt s/s of aspiration. Recommend patient upgrade to regular textures. Patient demonstrated selective attention to task for ~5 minute intervals with Mod a verbal cues for redirection. Patient left in enclosure bed with all needs within reach. Continue with current plan of care.      Function:  Eating Eating   Modified Consistency Diet: Yes Eating Assist Level: More than reasonable amount of time;Supervision or verbal cues;Set up assist for   Eating Set Up Assist For: Opening containers       Cognition Comprehension Comprehension assist level: Understands basic 50 - 74% of the time/ requires cueing 25 - 49% of the time  Expression    Expression assist level: Expresses basic 25 - 49% of the time/requires cueing 50 - 75% of the time. Uses single words/gestures.  Social Interaction Social Interaction assist level: Interacts appropriately 50 - 74% of the time - May be physically or verbally inappropriate.  Problem Solving Problem solving assist level: Solves basic 25 - 49% of the time - needs direction more than half the time to initiate, plan or complete simple activities  Memory Memory assist level: Recognizes or recalls 25 - 49% of the time/requires cueing 50 - 75% of the time    Pain Pain Assessment Pain Assessment: 0-10 Pain Score: 7  Pain Type: Acute pain Pain Location: Shoulder Pain Orientation: Right Pain Descriptors / Indicators: Aching Pain Frequency: Constant Pain Onset: Awakened from sleep Pain Intervention(s): Medication (See eMAR)  Therapy/Group: Individual Therapy  Arienna Benegas 06/05/2017, 4:00 PM

## 2017-06-05 NOTE — Progress Notes (Signed)
Occupational Therapy Session Note  Patient Details  Name: Anthony Singleton MRN: [id number] Date of Birth: 05-05-1971  Today's Date: 06/05/2017 OT Individual Time: 1101-1157 and 1500-1527 OT Individual Time Calculation (min): 56 min and 27 min  Short Term Goals: Week 1:  OT Short Term Goal 1 (Week 1): Pt will stand at sink wiht min A to groom  OT Short Term Goal 2 (Week 1): Pt will sequence bathing body parts wiht min VC OT Short Term Goal 3 (Week 1): Pt will initiate clothing mangment after toileting with no more than 1 VC OT Short Term Goal 4 (Week 1): Pt will attend to funtional task for 3 min and min VC  Skilled Therapeutic Interventions/Progress Updates:    Pt greeted in secure enclosure bed. Agreeable to tx with encouragement. He donned shoes with supervision and ambulated therapy gym, gathered therapy materials with instruction, and then continued to ortho gym with steady assist. While seated, provided pt with MHP for Rt shoulder pain. Therapist managed hot pack while pt engaged in simple puzzle assembly to work on sustained attention and problem solving. Pt requiring mod questioning/instructional cues to complete successfully. Max cues to locate gym to return therapy materials afterwards. Had him pathfind his way back to room with pt wandering down [place] hallway and requiring questioning cues to retrace steps and use external aides to navigate. Ultimately max cuing required to find room. Able to tolerate max stimulating hallway environment and avoid environmental barriers with min vcs. Pt reports biggest challenge in pathfinding was vision and that he needs his glasses from home. Once back in room, pt transferred to recliner and was left with x-ray tech to x-ray Rt shoulder.   2nd Session 1:1 tx (27 min) Pt greeted EOB with NT present taking vitals. He requested to use restroom. Pt ambulated with steady assist into bathroom and voided bladder, then ambulated to sink to wash hands  with instruction. Once pt returned to bed, he reported 10/10 headache pain, and laid down. RN providing pain medication during session. Discussed breathing techniques to decrease pain and also dimmed the lights in room to minimize visual stimulation. Pt inquiring if he could call his brother. Instructed pt on use of room phone, and he was able to call spouse with cuing to retrieve his brother's number. Per spouse, she would have his brother call back on the hospital line. Pt was left with call bell and room phone. Secured enclosure bed at session exit.    Therapy Documentation Precautions:  Precautions Precautions: Fall, Cervical Precaution Comments: monitor vitals Required Braces or Orthoses: Cervical Brace Cervical Brace: Hard collar, At all times Restrictions Weight Bearing Restrictions: No Pain: in Rt shoulder, reportedly subsided a little with applied heat  Pain Assessment Pain Score: 2  ADL:      See Function Navigator for Current Functional Status.   Therapy/Group: Individual Therapy  Anthony Singleton 06/05/2017, 12:24 PM

## 2017-06-05 NOTE — Progress Notes (Signed)
Physical Therapy Session Note  Patient Details  Name: Anthony Singleton MRN: [id number] Date of Birth: 03-25-1971  Today's Date: 06/05/2017 PT Individual Time: 0807-0903 PT Individual Time Calculation (min): 56 min  Short Term Goals: Week 2:  PT Short Term Goal 1 (Week 2): Pt will negotiate 4 steps with R rail only with min assist.  PT Short Term Goal 2 (Week 2): Pt will ambulate 50 ft with supervision assist with LRAD.  Skilled Therapeutic Interventions/Progress Updates:  Pt received sitting on EOB consuming breakfast with nursing staff present. Therapist provided supervision while pt finished breakfast and pt able to orrectly recall activities from yesterday's sessions without assistance but did require assistance to recall SLP's name.  Session focused on gait training for dynamic balance & NMR, transfers, and cognitive remediation. Pt ambulated throughout unit without AD & min guard assist with pt self selecting to intermittently hold to rails/wall. Pt engaged in pipe tree assembly, correctly assembling 2 moderately complex shapes with mod questioning cuing for error correction regarding incorrect pipe length. Pt completed car transfer at SUV [name] simulated height with steady assist. At end of session pt left in enclosure bed (open) with wife present to supervise. Educated pt & wife on need for nursing assistance to ambulate or get OOB & wife voiced understanding.   During session pt reported his head hurt when he looked left/right and when he blinked. Educated pt on recommendation of vestibular eval.   Therapy Documentation Precautions:  Precautions Precautions: Fall, Cervical Precaution Comments: monitor vitals Required Braces or Orthoses: Cervical Brace Cervical Brace: Hard collar, At all times Restrictions Weight Bearing Restrictions: No  Pain: C/o R shoulder pain but premedicated.   See Function Navigator for Current Functional Status.   Therapy/Group: Individual  Therapy  Sandi MariscalVictoria M Argus Caraher 06/05/2017, 10:04 AM

## 2017-06-05 NOTE — Progress Notes (Signed)
Speech Language Pathology Weekly Progress and Session Note  Patient Details  Name: Anthony Singleton MRN: [id number] Date of Birth: 1970/06/30  Beginning of progress report period: May 29, 2017 End of progress report period: June 05, 2017  Today's Date: 06/05/2017 SLP Individual Time: 6294-7654 SLP Individual Time Calculation (min): 55 min  Short Term Goals: Week 1: SLP Short Term Goal 1 (Week 1): Patient will consume current diet with minimal overt s/s of aspiration with Mod A verbal cues for use of swallowing compensatory strategies.  SLP Short Term Goal 1 - Progress (Week 1): Met SLP Short Term Goal 2 (Week 1): Patient will consume trials of thin liquids via cup with minimal overt s/s of aspiration and Mod A verbal cues for use of strategies over 3 sessions prior to upgrade.  SLP Short Term Goal 2 - Progress (Week 1): Met SLP Short Term Goal 3 (Week 1): Patient will demonstrate sustained attention to a functional task for ~10 minutes with Mod A verbal cues for redirection.  SLP Short Term Goal 3 - Progress (Week 1): Met SLP Short Term Goal 4 (Week 1): Patient will verbalize 1 cognitive and 1 physical deficit with Max A verbal cues.  SLP Short Term Goal 4 - Progress (Week 1): Met SLP Short Term Goal 5 (Week 1): Patient will demonstrate functional problem solving for basic and familiar tasks with Max A verbal cues.  SLP Short Term Goal 5 - Progress (Week 1): Met SLP Short Term Goal 6 (Week 1): Patient will utilize external aids to orient to time, place and situation with supervision verbal cues.  SLP Short Term Goal 6 - Progress (Week 1): Met    New Short Term Goals: Week 2: SLP Short Term Goal 1 (Week 2): Patient will consume current diet with minimal overt s/s of aspiration with Min A verbal cues for use of swallowing compensatory strategies.  SLP Short Term Goal 2 (Week 2): Patient will demonstrate efficient mastication without overt s/s of aspiration with regular  textures over 2 sessions prior to upgrade.  SLP Short Term Goal 3 (Week 2): Patient will demonstrate functional problem solving for basic and familiar tasks with Max A verbal cues.  SLP Short Term Goal 4 (Week 2): Patient will verbalize 1 cognitive and 1 physical deficit with Mod A verbal cues.  SLP Short Term Goal 5 (Week 2): Patient will demonstrate sustained attention to a functional task for ~10 minutes with Min A verbal cues for redirection.   Weekly Progress Updates: Patient has made excellent gains and has met 6 of 6 STG's this reporting period. Currently, patient is consuming Dys. 3 textures with thin liquids without overt s/s of aspiration and supervision-Min A verbal cues needed for use of swallowing compensatory strategies. Patient demonstrates behaviors consistent with a [place]o Level VI and requires overall Max A multimodal cues to complete functional and familiar tasks safely in regards to problem solving, recall, awareness and attention. Patient and family education is ongoing. Patient would benefit from continued skilled SLP intervention to maximize his swallowing and cognitive functioning and overall functional independence prior to discharge.      Intensity: Minumum of 1-2 x/day, 30 to 90 minutes Frequency: 3 to 5 out of 7 days Duration/Length of Stay: 3 weeks  Treatment/Interventions: Cognitive remediation/compensation;Environmental controls;Internal/external aids;Cueing hierarchy;Dysphagia/aspiration precaution training;Functional tasks;Patient/family education;Therapeutic Activities   Daily Session  Skilled Therapeutic Interventions: Skilled treatment session focused on cognitive goals. SLP facilitated session by administering the MoCA-version 7.1. Patient scored 20/30 points with a score  of 26 or above considered normal. Patient demonstrated deficits in short-term recall, attention, executive functioning and orientation. Patient reported he had a headache throughout the session  (premedicated) and required frequent breaks with Max A verbal cues needed for sustained attention for ~2 minutes. Patient left upright in enclosure bed with all needs within reach. Continue with current plan of care.       Function:   Eating Eating   Modified Consistency Diet: Yes Eating Assist Level: More than reasonable amount of time;Supervision or verbal cues;Set up assist for   Eating Set Up Assist For: Opening containers       Cognition Comprehension Comprehension assist level: Understands basic 50 - 74% of the time/ requires cueing 25 - 49% of the time  Expression   Expression assist level: Expresses basic 25 - 49% of the time/requires cueing 50 - 75% of the time. Uses single words/gestures.  Social Interaction Social Interaction assist level: Interacts appropriately 50 - 74% of the time - May be physically or verbally inappropriate.  Problem Solving Problem solving assist level: Solves basic 25 - 49% of the time - needs direction more than half the time to initiate, plan or complete simple activities  Memory Memory assist level: Recognizes or recalls 25 - 49% of the time/requires cueing 50 - 75% of the time   Pain 6/10-headache, patient premedicated   Therapy/Group: Individual Therapy  Mikylah Ackroyd 06/05/2017, 3:54 PM

## 2017-06-06 ENCOUNTER — Inpatient Hospital Stay (HOSPITAL_COMMUNITY): Payer: BLUE CROSS/BLUE SHIELD | Admitting: Occupational Therapy

## 2017-06-06 ENCOUNTER — Inpatient Hospital Stay (HOSPITAL_COMMUNITY): Payer: BLUE CROSS/BLUE SHIELD | Admitting: Physical Therapy

## 2017-06-06 DIAGNOSIS — I10 Essential (primary) hypertension: Secondary | ICD-10-CM

## 2017-06-06 DIAGNOSIS — S069X9S Unspecified intracranial injury with loss of consciousness of unspecified duration, sequela: Secondary | ICD-10-CM

## 2017-06-06 DIAGNOSIS — R569 Unspecified convulsions: Secondary | ICD-10-CM

## 2017-06-06 DIAGNOSIS — R000 Tachycardia, unspecified: Secondary | ICD-10-CM

## 2017-06-06 DIAGNOSIS — D62 Acute posthemorrhagic anemia: Secondary | ICD-10-CM

## 2017-06-06 DIAGNOSIS — R52 Pain, unspecified: Secondary | ICD-10-CM

## 2017-06-06 DIAGNOSIS — R1310 Dysphagia, unspecified: Secondary | ICD-10-CM

## 2017-06-06 MED ORDER — METOPROLOL TARTRATE 25 MG PO TABS
25.0000 mg | ORAL_TABLET | Freq: Once | ORAL | Status: AC
  Administered 2017-06-06: 25 mg via ORAL
  Filled 2017-06-06: qty 1

## 2017-06-06 NOTE — Progress Notes (Signed)
Physical Therapy Session Note  Patient Details  Name: Anthony Singleton MRN: [id number] Date of Birth: 12/25/1970  Today's Date: 06/06/2017 PT Individual Time: 0900-0940 PT Individual Time Calculation (min): 40 min   Short Term Goals: Week 2:  PT Short Term Goal 1 (Week 2): Pt will negotiate 4 steps with R rail only with min assist.  PT Short Term Goal 2 (Week 2): Pt will ambulate 50 ft with supervision assist with LRAD.  Skilled Therapeutic Interventions/Progress Updates:   Pt in recliner and agreeable to therapy w/ encouragement. C/o R shoulder pain, RN aware. Perseverated on shoulder pain throughout session, however easily redirected. Focused on endurance w/ gait and cognitive remediation this session. Ambulated around hospital, [phone]' bouts, on even and uneven surfaces including stairs and ramp w/ min assist for occasional LOB. Had conversation about pt's children and what he did for work, verbal cues to attend to task and maintain conversation topic as easily distracted by busier environment. Mod cues to return to unit for directions. Pt requesting ice cream and provided w/ thinned ice cream per RN request and ended session in enclosure bed, call bell within reach and all needs met.   Therapy Documentation Precautions:  Precautions Precautions: Fall, Cervical Precaution Comments: monitor vitals Required Braces or Orthoses: Cervical Brace Cervical Brace: Hard collar, At all times Restrictions Weight Bearing Restrictions: No Vital Signs: Therapy Vitals Pulse Rate: (!) 116 BP: 114/68  See Function Navigator for Current Functional Status.   Therapy/Group: Individual Therapy  Zandrea Kenealy K Arnette 06/06/2017, 10:20 AM

## 2017-06-06 NOTE — Progress Notes (Signed)
Occupational Therapy Session Note  Patient Details  Name: Anthony Singleton MRN: [id number] Date of Birth: Jun 09, 1970  Today's Date: 06/06/2017 OT Individual Time: [id number] OT Individual Time Calculation (min): 75 min   Short Term Goals: Week 1:  OT Short Term Goal 1 (Week 1): Pt will stand at sink wiht min A to groom  OT Short Term Goal 2 (Week 1): Pt will sequence bathing body parts wiht min VC OT Short Term Goal 3 (Week 1): Pt will initiate clothing mangment after toileting with no more than 1 VC OT Short Term Goal 4 (Week 1): Pt will attend to funtional task for 3 min and min VC  Skilled Therapeutic Interventions/Progress Updates:    Pt greeted in enclosure bed. Agreeable to shower. He ambulated with steady assist into bathroom, where he doffed clothing in standing and transferred to toilet. After having BM and completing perihygiene, he transferred into shower with steady assist. Educated him on adjusting temperature controls with pt adjusting himself throughout. Pt self sequencing and locating needed items with min vcs. Sit<stand for pericare completed with steady assist. Afterwards he dressed at sit<stand level from toilet. Able to orient clothing himself. Steady assist for donning underwear in standing. Pt then ambulated to bedside recliner and completed grooming tasks with supervision. He declined shaving/oral care. Pt consuming ice cream and applesauce EOB with supervision. Spouse arrived prior to OT departure. Pt left with spouse and therefore enclosure bed remained open.   Therapy Documentation Precautions:  Precautions Precautions: Fall, Cervical Precaution Comments: monitor vitals Required Braces or Orthoses: Cervical Brace Cervical Brace: Hard collar, At all times Restrictions Weight Bearing Restrictions: No Vital Signs: Therapy Vitals Temp: 98.1 F (36.7 C) Temp Source: Oral Pulse Rate: 86 Resp: 18 BP: 121/81 Patient Position (if appropriate): Lying Oxygen  Therapy SpO2: 100 % O2 Device: Not Delivered Pain: Rt shoulder pain. Declined thermotherapy. Medicated by RN during tx.    ADL:     See Function Navigator for Current Functional Status.   Therapy/Group: Individual Therapy  Elizardo Chilson A Jakeria Caissie 06/06/2017, 3:46 PM

## 2017-06-06 NOTE — Progress Notes (Signed)
Slept good last night. Called at 0200 for assist to BR. Continent of urine x 2 this shift. [facility]. Refused scheduled muscle rub. Alfredo MartinezMurray, Lexii Walsh A

## 2017-06-06 NOTE — Progress Notes (Signed)
Subjective/Complaints: Patient seen lying in bed this morning. Sleep chart not updated, but per report patient slept well overnight. He is agitated this morning and perseverates on wanting to take a shower.  ROS: Denies CP, SOB, nausea, vomiting, diarrhea.   Objective: Vital Signs: Blood pressure 97/66, pulse (!) 116, temperature 98.1 F (36.7 C), temperature source Oral, resp. rate 18, height _0  (1.956 m), weight 78.5 kg (173 lb 1 oz), SpO2 100 %. Dg Shoulder Right  Result Date: 06/05/2017 CLINICAL DATA:  Chronic right shoulder pain. EXAM: RIGHT SHOULDER - 2+ VIEW COMPARISON:  None. FINDINGS: Healing right rib fractures. Limited views of the right chest are otherwise normal. No abnormalities identified in the shoulder. No fracture or dislocation. No degenerative changes identified. IMPRESSION: Healing right rib fractures. No acute shoulder abnormalities identified. No cause for chronic right shoulder pain identified. Electronically Signed   By: Dorise Bullion III M.D   On: 06/05/2017 12:06   Results for orders placed or performed during the hospital encounter of 05/29/17 (from the past 72 hour(s))  Basic metabolic panel     Status: None   Collection Time: 06/05/17  7:14 AM  Result Value Ref Range   Sodium 138 135 - 145 mmol/L   Potassium 4.0 3.5 - 5.1 mmol/L   Chloride 103 101 - 111 mmol/L   CO2 27 22 - 32 mmol/L   Glucose, Bld 87 65 - 99 mg/dL   BUN 14 6 - 20 mg/dL   Creatinine, Ser 0.71 0.61 - 1.24 mg/dL   Calcium 9.0 8.9 - 10.3 mg/dL   GFR calc non Af Amer >60 >60 mL/min   GFR calc Af Amer >60 >60 mL/min    Comment: (NOTE) The eGFR has been calculated using the CKD EPI equation. This calculation has not been validated in all clinical situations. eGFR's persistently <60 mL/min signify possible Chronic Kidney Disease.    Anion gap 8 5 - 15     HEENT: Normocephalic. Atraumatic. Cardio: RRR. No JVD      Resp: CTA Bilaterally. Normal effort   GI: BS positive andND Skin:    Intact. Warm and dry. Neuro: Alert  Able to move all 4 extremities  Musc/Skel: No edema in extremities Gen NAD. Vital signs reviewed. Psych: Agitated.  Assessment/Plan: 1. Functional deficits secondary to TBI skull fracture which require 3+ hours per day of interdisciplinary therapy in a comprehensive inpatient rehab setting. Physiatrist is providing close team supervision and 24 hour management of active medical problems listed below. Physiatrist and rehab team continue to assess barriers to discharge/monitor patient progress toward functional and medical goals. FIM: Function - Bathing Position: Shower Body parts bathed by patient: Right arm, Left arm, Chest, Abdomen, Front perineal area, Buttocks, Right upper leg, Left upper leg, Right lower leg, Left lower leg Body parts bathed by helper: Back Bathing not applicable: Front perineal area, Buttocks, Right upper leg, Left upper leg, Right lower leg, Left lower leg Assist Level: Touching or steadying assistance(Pt > 75%)  Function- Upper Body Dressing/Undressing What is the patient wearing?: Pull over shirt/dress Pull over shirt/dress - Perfomed by patient: Thread/unthread right sleeve, Thread/unthread left sleeve, Put head through opening, Pull shirt over trunk Assist Level: Set up, Supervision or verbal cues Set up : To obtain clothing/put away Function - Lower Body Dressing/Undressing What is the patient wearing?: Shoes Position: Other (comment)(sitting on commode) Underwear - Performed by patient: Thread/unthread right underwear leg, Thread/unthread left underwear leg, Pull underwear up/down Pants- Performed by patient: Thread/unthread right pants  leg, Thread/unthread left pants leg, Pull pants up/down Socks - Performed by patient: Don/doff right sock, Don/doff left sock Shoes - Performed by patient: Don/doff right shoe, Don/doff left shoe Shoes - Performed by helper: Don/doff right shoe, Don/doff left shoe, Fasten right, Fasten  left Assist for footwear: Supervision/touching assist Assist for lower body dressing: Touching or steadying assistance (Pt > 75%)  Function - Toileting Toileting steps completed by patient: Adjust clothing prior to toileting, Performs perineal hygiene, Adjust clothing after toileting Toileting steps completed by helper: Adjust clothing after toileting Toileting Assistive Devices: Grab bar or rail Assist level: Touching or steadying assistance (Pt.75%)  Function - Air cabin crew transfer activity did not occur: Safety/medical concerns Toilet transfer assistive device: Grab bar Assist level to toilet: Touching or steadying assistance (Pt > 75%) Assist level from toilet: Touching or steadying assistance (Pt > 75%)  Function - Chair/bed transfer Chair/bed transfer method: Ambulatory Chair/bed transfer assist level: Supervision or verbal cues Chair/bed transfer assistive device: Armrests Chair/bed transfer details: Verbal cues for technique, Verbal cues for precautions/safety  Function - Locomotion: Wheelchair Will patient use wheelchair at discharge?: No Type: Manual Assist Level: Dependent (Pt equals 0%) Assist Level: Dependent (Pt equals 0%) Assist Level: Dependent (Pt equals 0%) Function - Locomotion: Ambulation Ambulation activity did not occur: Safety/medical concerns Assistive device: No device Max distance: 150 ft Assist level: Touching or steadying assistance (Pt > 75%) Assist level: Touching or steadying assistance (Pt > 75%) Assist level: Touching or steadying assistance (Pt > 75%) Walk 150 feet activity did not occur: Safety/medical concerns Assist level: Touching or steadying assistance (Pt > 75%) Walk 10 feet on uneven surfaces activity did not occur: Safety/medical concerns Assist level: Touching or steadying assistance (Pt > 75%)  Function - Comprehension Comprehension: Auditory Comprehension assist level: Understands basic 50 - 74% of the time/ requires  cueing 25 - 49% of the time  Function - Expression Expression: Verbal Expression assist level: Expresses basic 25 - 49% of the time/requires cueing 50 - 75% of the time. Uses single words/gestures.  Function - Social Interaction Social Interaction assist level: Interacts appropriately 50 - 74% of the time - May be physically or verbally inappropriate.  Function - Problem Solving Problem solving assist level: Solves basic 25 - 49% of the time - needs direction more than half the time to initiate, plan or complete simple activities  Function - Memory Memory assist level: Recognizes or recalls 25 - 49% of the time/requires cueing 50 - 75% of the time Patient normally able to recall (first 3 days only): That he or she is in a hospital  Medical Problem List and Plan: 1.  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  DVT Prophylaxis/Anticoagulation: Mechanical: Sequential compression devices, below knee Bilateral lower extremities 3. Headaches/ Pain Management: tylenol   -Headaches appear improved.      -X-ray right shoulder reviewed, unremarkable for fracture Added muscle rub for muscular component of pain 4. Mood: Monitor as mentation improves. LCSW to follow along for evaluation and support when appropriate.  5. Neuropsych: This patient is not capable of making decisions on his own behalf.   - continue ritalin to improve attention day/night cycle  -Wean daytime Seroquel as appropriate   -Continues to require enclosure bed due to safety issues 6. Skin/Wound Care: routine pressure relief measures.  7. Fluids/Electrolytes/Nutrition:  Encourage p.o. Intake   -BMP within normal limits on 1/11 8. Seizures: Continue Keppra bid.  9. Dysphagia: Encourage appropriate swallowing strategies..    Advanced regular diet with full supervision 10. HTN: Monitor   Patient states he  removed catapres TTS patch and unable to locate, BP controlled/low at present will not replace  11. Tachycardia: Improved, continues on metoprolol 100 mg twice daily, only given 25 mg on 1/12 due to low BP Vitals:   06/06/17 0340 06/06/17 0827  BP: 110/80 97/66  Pulse: 100 (!) 116  Resp: 18   Temp: 98.1 F (36.7 C)   SpO2: 100%      12. ABLA:Improved Hgb 12.1 on 1/11.   Added multivitamin with iron.  13.  Sleep: Improving             -  sleep-wake chart to continue             -Decreased am Seroquel dose to 50 mg bid   200 mg at 8pm,    -Continue trazodone at bedtime 14.  Hypoalbuminemia addded prostat 15. 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 Per reports--tinnitus was worsening as well as problems with equilibrium since thanksgiving-question Meniere's disease.    16.  C Spine CT negative  LOS (Days) 8 A FACE TO FACE EVALUATION WAS PERFORMED  Anthony Singleton Anthony Singleton 06/06/2017, 8:58 AM

## 2017-06-06 NOTE — Plan of Care (Signed)
  Not Progressing RH PAIN MANAGEMENT RH STG PAIN MANAGED AT OR BELOW PT'S PAIN GOAL Description 3 or less  Consistently rating pain > 4 on faces pain scale. 06/06/2017 0034 - Not Progressing by Martina SinnerMurray, Encarnacion Bole A, RN

## 2017-06-07 ENCOUNTER — Inpatient Hospital Stay (HOSPITAL_COMMUNITY): Payer: BLUE CROSS/BLUE SHIELD | Admitting: Occupational Therapy

## 2017-06-07 NOTE — Plan of Care (Signed)
  Not Progressing RH SAFETY RH STG ADHERE TO SAFETY PRECAUTIONS W/ASSISTANCE/DEVICE Description STG Adhere to Safety Precautions With mod Assistance/Device.   Max assist-continues to require enclosure bed and 1:1 when out of bed. 06/07/2017 2144 - Not Progressing by Martina SinnerMurray, Evelynn Hench A, RN
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
## 2017-06-07 NOTE — Progress Notes (Signed)
Subjective/Complaints: Patient seen lying bed this morning. Reportedly did not sleep well overnight, per nursing. This morning he is agitated about "being in this kennel"  ROS: Denies CP, SOB, nausea, vomiting, diarrhea.   Objective: Vital Signs: Blood pressure 103/74, pulse 91, temperature 97.6 F (36.4 C), temperature source Axillary, resp. rate 18, height 6' 5" (1.956 m), weight 78.5 kg (173 lb 1 oz), SpO2 100 %. Dg Shoulder Right  Result Date: 06/05/2017 CLINICAL DATA:  Chronic right shoulder pain. EXAM: RIGHT SHOULDER - 2+ VIEW COMPARISON:  None. FINDINGS: Healing right rib fractures. Limited views of the right chest are otherwise normal. No abnormalities identified in the shoulder. No fracture or dislocation. No degenerative changes identified. IMPRESSION: Healing right rib fractures. No acute shoulder abnormalities identified. No cause for chronic right shoulder pain identified. Electronically Signed   By: Dorise Bullion III M.D   On: 06/05/2017 12:06   Results for orders placed or performed during the hospital encounter of 05/29/17 (from the past 72 hour(s))  Basic metabolic panel     Status: None   Collection Time: 06/05/17  7:14 AM  Result Value Ref Range   Sodium 138 135 - 145 mmol/L   Potassium 4.0 3.5 - 5.1 mmol/L   Chloride 103 101 - 111 mmol/L   CO2 27 22 - 32 mmol/L   Glucose, Bld 87 65 - 99 mg/dL   BUN 14 6 - 20 mg/dL   Creatinine, Ser 0.71 0.61 - 1.24 mg/dL   Calcium 9.0 8.9 - 10.3 mg/dL   GFR calc non Af Amer >60 >60 mL/min   GFR calc Af Amer >60 >60 mL/min    Comment: (NOTE) The eGFR has been calculated using the CKD EPI equation. This calculation has not been validated in all clinical situations. eGFR's persistently <60 mL/min signify possible Chronic Kidney Disease.    Anion gap 8 5 - 15     HEENT: Normocephalic. Atraumatic. Cardio: RRR. No JVD      Resp: CTA Bilaterally. Normal effort   GI: BS positive andND Skin:   Intact. Warm and dry. Neuro: Alert   Able to move all 4 extremities  Musc/Skel: No edema in extremities Gen NAD. Vital signs reviewed. Psych: Agitated, inappropriate.  Assessment/Plan: 1. Functional deficits secondary to TBI skull fracture which require 3+ hours per day of interdisciplinary therapy in a comprehensive inpatient rehab setting. Physiatrist is providing close team supervision and 24 hour management of active medical problems listed below. Physiatrist and rehab team continue to assess barriers to discharge/monitor patient progress toward functional and medical goals. FIM: Function - Bathing Position: Shower Body parts bathed by patient: Right arm, Left arm, Chest, Abdomen, Front perineal area, Buttocks, Right upper leg, Left upper leg, Right lower leg, Left lower leg Body parts bathed by helper: Back Bathing not applicable: Back Assist Level: Touching or steadying assistance(Pt > 75%)  Function- Upper Body Dressing/Undressing What is the patient wearing?: Pull over shirt/dress Pull over shirt/dress - Perfomed by patient: Thread/unthread right sleeve, Thread/unthread left sleeve, Put head through opening, Pull shirt over trunk Assist Level: Set up, Supervision or verbal cues Set up : To obtain clothing/put away Function - Lower Body Dressing/Undressing What is the patient wearing?: Shoes, Pants, Underwear, Socks Position: Other (comment)(sitting on toilet and standing near toilet) Underwear - Performed by patient: Thread/unthread right underwear leg, Thread/unthread left underwear leg, Pull underwear up/down Pants- Performed by patient: Thread/unthread right pants leg, Thread/unthread left pants leg, Pull pants up/down Socks - Performed by patient: Don/doff right  sock, Don/doff left sock Shoes - Performed by patient: Don/doff right shoe, Don/doff left shoe Shoes - Performed by helper: Don/doff right shoe, Don/doff left shoe, Fasten right, Fasten left Assist for footwear: Supervision/touching assist Assist for  lower body dressing: Touching or steadying assistance (Pt > 75%)  Function - Toileting Toileting steps completed by patient: Adjust clothing prior to toileting, Performs perineal hygiene Toileting steps completed by helper: Adjust clothing after toileting Toileting Assistive Devices: Grab bar or rail Assist level: Touching or steadying assistance (Pt.75%)  Function - Air cabin crew transfer activity did not occur: Safety/medical concerns Toilet transfer assistive device: Grab bar Assist level to toilet: Touching or steadying assistance (Pt > 75%) Assist level from toilet: Touching or steadying assistance (Pt > 75%)  Function - Chair/bed transfer Chair/bed transfer method: Ambulatory Chair/bed transfer assist level: Supervision or verbal cues Chair/bed transfer assistive device: Armrests Chair/bed transfer details: Verbal cues for technique, Verbal cues for precautions/safety  Function - Locomotion: Wheelchair Will patient use wheelchair at discharge?: No Type: Manual Assist Level: Dependent (Pt equals 0%) Assist Level: Dependent (Pt equals 0%) Assist Level: Dependent (Pt equals 0%) Function - Locomotion: Ambulation Ambulation activity did not occur: Safety/medical concerns Assistive device: No device Max distance: 150 ft Assist level: Touching or steadying assistance (Pt > 75%) Assist level: Touching or steadying assistance (Pt > 75%) Assist level: Touching or steadying assistance (Pt > 75%) Walk 150 feet activity did not occur: Safety/medical concerns Assist level: Touching or steadying assistance (Pt > 75%) Walk 10 feet on uneven surfaces activity did not occur: Safety/medical concerns Assist level: Touching or steadying assistance (Pt > 75%)  Function - Comprehension Comprehension: Auditory Comprehension assist level: Understands basic 50 - 74% of the time/ requires cueing 25 - 49% of the time  Function - Expression Expression: Verbal Expression assist level:  Expresses basic 25 - 49% of the time/requires cueing 50 - 75% of the time. Uses single words/gestures.  Function - Social Interaction Social Interaction assist level: Interacts appropriately 50 - 74% of the time - May be physically or verbally inappropriate.  Function - Problem Solving Problem solving assist level: Solves basic 25 - 49% of the time - needs direction more than half the time to initiate, plan or complete simple activities  Function - Memory Memory assist level: Recognizes or recalls 25 - 49% of the time/requires cueing 50 - 75% of the time Patient normally able to recall (first 3 days only): That he or she is in a hospital, Staff names and faces  Medical Problem List and Plan: 1.  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  DVT Prophylaxis/Anticoagulation: Mechanical: Sequential compression devices, below knee Bilateral lower extremities 3. Headaches/ Pain Management: tylenol   -Headaches appear improved.      -X-ray right shoulder reviewed, unremarkable for fracture Added muscle rub for muscular component of pain 4. Mood: Monitor as mentation improves. LCSW to follow along for evaluation and support when appropriate.  5. Neuropsych: This patient is not capable of making decisions on his own behalf.   - continue ritalin to improve attention day/night cycle  -Wean daytime Seroquel as appropriate   -Continues to require enclosure bed due to safety issues 6. Skin/Wound Care: routine pressure relief measures.  7. Fluids/Electrolytes/Nutrition: Encourage p.o. Intake   -BMP within normal limits on 1/11 8. Seizures: Continue Keppra bid.  9. Dysphagia:  Encourage appropriate swallowing strategies..    Advanced regular diet with full supervision 10. HTN: Monitor   Patient states he removed catapres TTS patch and unable to locate, BP controlled/low at present will not replace  11. Tachycardia:  Improved, continues on metoprolol 100 mg twice daily, only given 25 mg on 1/12 due to low BP Vitals:   06/06/17 1956 06/07/17 0600  BP: 107/79 103/74  Pulse: (!) 107 91  Resp:    Temp:  97.6 F (36.4 C)  SpO2:  100%     Relatively controlled on 1/13 12. ABLA:   Hgb 12.1 on 1/11.   Added multivitamin with iron.  13.  Sleep: Improving             -  sleep-wake chart to continue             -Decreased am Seroquel dose to 50 mg bid   200 mg at 8pm,    -Continue trazodone at bedtime 14.  Hypoalbuminemia addded prostat 15. 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 Per reports--tinnitus was worsening as well as problems with equilibrium since thanksgiving-question Meniere's disease.    16.  C Spine CT negative  LOS (Days) 9 A FACE TO FACE EVALUATION WAS PERFORMED  Nasiya Pascual Lorie Phenix 06/07/2017, 7:36 AM

## 2017-06-08 ENCOUNTER — Inpatient Hospital Stay (HOSPITAL_COMMUNITY): Payer: BLUE CROSS/BLUE SHIELD | Admitting: Occupational Therapy

## 2017-06-08 ENCOUNTER — Inpatient Hospital Stay (HOSPITAL_COMMUNITY): Payer: BLUE CROSS/BLUE SHIELD

## 2017-06-08 ENCOUNTER — Inpatient Hospital Stay (HOSPITAL_COMMUNITY): Payer: BLUE CROSS/BLUE SHIELD | Admitting: Physical Therapy

## 2017-06-08 ENCOUNTER — Inpatient Hospital Stay (HOSPITAL_COMMUNITY): Payer: BLUE CROSS/BLUE SHIELD | Admitting: Speech Pathology

## 2017-06-08 MED ORDER — QUETIAPINE FUMARATE 25 MG PO TABS
25.0000 mg | ORAL_TABLET | Freq: Two times a day (BID) | ORAL | Status: DC
  Administered 2017-06-08 – 2017-06-10 (×4): 25 mg via ORAL
  Filled 2017-06-08 (×4): qty 1

## 2017-06-08 MED ORDER — METOPROLOL TARTRATE 50 MG PO TABS
50.0000 mg | ORAL_TABLET | Freq: Two times a day (BID) | ORAL | Status: DC
  Administered 2017-06-08: 50 mg via ORAL
  Filled 2017-06-08 (×2): qty 1

## 2017-06-08 NOTE — Progress Notes (Signed)
Better spirits tonight since wife staying. Wife sits in recliner beside enclosure bed. Patient tearful talking about his children. At 1946 PRN oxy IR 10mg  given for C/O HA.  At 2325, PRN robaxin and scheduled tylenol given, for C/O neck pain. At 0445, awake wanting shower. Agreeable to wait until OT assists him. Dizzy at times, especially when bending over. Alfredo MartinezMurray, Lora Chavers A

## 2017-06-08 NOTE — Progress Notes (Signed)
Occupational Therapy Session Note  Patient Details  Name: Anthony Singleton XXXWilliams MRN: [id number] Date of Birth: [birth year]  Today's Date: 06/08/2017 OT Individual Time: [id number] OT Individual Time Calculation (min): 70 min   Short Term Goals: Week 2:  OT Short Term Goal 1 (Week 2): Pt will complete bathroom transfers at ambulatory level with supervision  OT Short Term Goal 2 (Week 2): Pt will initiate gathering of clothing items in prep for ADLs with questioning cues OT Short Term Goal 3 (Week 2): Pt will complete bathing with supervision   Skilled Therapeutic Interventions/Progress Updates:    Pt greeted asleep in enclosure bed, spouse present. Pt easily woken but resistant to get OOB. Amenable when offered chocolate ice cream. After consuming ice cream with supervision, he ambulated to dresser and retrieved clothing spouse had laid out for him. Pt transferred into shower with Min A, sat on TTB for 90% of bathing. Able to sequence with min vcs and locate his shampoo and soap without cues. Close supervision for pericare completion while standing. He then initiated termination of shower after reasonable time. Pt drying off in standing with steady assist while using grab bar for UE support. Afterwards he transferred to toilet to dress. Required questioning cues to locate clothing that he had placed on overhead shelf. Pt donning boxer shorts while standing with steady assist, then he ambulated into room and finished dressing in bedside recliner. Setup for grooming items and footwear. After his 2nd ice cream, pt agreeable to ambulate. He walked down hallway with steady assist with increased reliance on hallway rails in moderately stimulating environment. Increased unsteadiness noted in open environments. Pt pathfinding his way back with min vcs, unable to recall his room number. At session exit pt was left in secured enclosure bed with all needs, anticipating next therapist.   Sustained attention to  task is improving. Increased time and mod questioning cues provided to redirect pt back to self care tasks when he becomes hyperverbal  Therapy Documentation Precautions:  Precautions Precautions: Fall, Cervical Precaution Comments: monitor vitals Required Braces or Orthoses: (Cervical collar d/c 1/7) Cervical Brace: (DC hard collar) Restrictions Weight Bearing Restrictions: No Pain: No c/o Rt shoulder pain today   ADL:      See Function Navigator for Current Functional Status.  Therapy/Group: Individual Therapy  Deran Barro A Arabell Neria 06/08/2017, 12:27 PM

## 2017-06-08 NOTE — Progress Notes (Signed)
Speech Language Pathology Daily Session Note  Patient Details  Name: Anthony Singleton MRN: [id number] Date of Birth: Nov 15, 1970  Today's Date: 06/08/2017 SLP Individual Time: 1400-1500 SLP Individual Time Calculation (min): 60 min  Short Term Goals: Week 2: SLP Short Term Goal 1 (Week 2): Patient will consume current diet with minimal overt s/s of aspiration with Min A verbal cues for use of swallowing compensatory strategies.  SLP Short Term Goal 2 (Week 2): Patient will demonstrate efficient mastication without overt s/s of aspiration with regular textures over 2 sessions prior to upgrade.  SLP Short Term Goal 3 (Week 2): Patient will demonstrate functional problem solving for basic and familiar tasks with Max A verbal cues.  SLP Short Term Goal 4 (Week 2): Patient will verbalize 1 cognitive and 1 physical deficit with Mod A verbal cues.  SLP Short Term Goal 5 (Week 2): Patient will demonstrate sustained attention to a functional task for ~10 minutes with Min A verbal cues for redirection.   Skilled Therapeutic Interventions: Skilled treatment session focused on cognitive goals. Upon arrival, patient's call light was on and patient reported he needed to use the bathroom. Patient was continent of bowel and was Mod I for self-care. SLP facilitated session by providing Min A verbal cues for problem solving and recall during a novel, mildly complex card task. Patient initially required encouragement to participate but attended to card task for ~15 minutes with min a verbal cues for redirection. Patient left supine in enclosure bed with all needs within reach. Continue with current plan of care.      Function:   Cognition Comprehension Comprehension assist level: Follows basic conversation/direction with no assist  Expression   Expression assist level: Expresses basic 90% of the time/requires cueing < 10% of the time.  Social Interaction Social Interaction assist level: Interacts  appropriately 50 - 74% of the time - May be physically or verbally inappropriate.  Problem Solving Problem solving assist level: Solves basic 50 - 74% of the time/requires cueing 25 - 49% of the time  Memory Memory assist level: Recognizes or recalls 50 - 74% of the time/requires cueing 25 - 49% of the time    Pain Pain Assessment Pain Assessment: 0-10 Pain Score: 5  Pain Type: Acute pain Pain Location: Head Pain Descriptors / Indicators: Headache Pain Frequency: Occasional Pain Onset: Gradual Patients Stated Pain Goal: 2 Pain Intervention(s): Medication (See eMAR)(oxycodone 5 mg po)  Therapy/Group: Individual Therapy  Anthony Singleton 06/08/2017, 4:52 PM

## 2017-06-08 NOTE — Progress Notes (Signed)
Physical Therapy Session Note  Patient Details  Name: Anthony Singleton MRN: [id number] Date of Birth: 10-25-1970  Today's Date: 06/08/2017 PT Individual Time: [id number] PT Individual Time Calculation (min): 60 min   Short Term Goals: Week 2:  PT Short Term Goal 1 (Week 2): Pt will negotiate 4 steps with R rail only with min assist.  PT Short Term Goal 2 (Week 2): Pt will ambulate 50 ft with supervision assist with LRAD.  Skilled Therapeutic Interventions/Progress Updates:    Pt in bed upon arrival with spouse present. Pt willing to participate with minimal encouragement. Bed mobility: supervision supine>sitting EOB. Reports mild dizziness while sitting. Gait: ambulating 100-350 ft throughout session with variable assistance from min guard to supervision. Pt using hall rail at times for additional support, resistant to not using with therapist cues. Transfers: sit<>stand with supervision performed from bed, chair, mat table. Functional activity: standing activity with reaching, problem solving and balance tasks. Activities included pipe building and color peg board. Pt able to follow pictures to recreate photo with minimal cues. Following session, pt up in recliner with spouse present. Nursing is aware.   Therapy Documentation Precautions:  Precautions Precautions: Fall, Cervical Precaution Comments: monitor vitals Required Braces or Orthoses: (Cervical collar d/c 1/7) Cervical Brace: (DC hard collar) Restrictions Weight Bearing Restrictions: No Pain:  Reports intermittently that Rt shoulder is sore but nursing put some rub on it and it is feeling better.   See Function Navigator for Current Functional Status.   Therapy/Group: Individual Therapy  Delton SeeBenjamin Mikell Camp 06/08/2017, 9:09 AM

## 2017-06-08 NOTE — Plan of Care (Signed)
Patient continues to require mod to max assist at times for memory strategies and wife assists patient with redirection well.

## 2017-06-08 NOTE — Progress Notes (Signed)
Physical Therapy Session Note  Patient Details  Name: Anthony Singleton Dwayne XXXWilliams MRN: [id number] Date of Birth: 03/29/1971  Today's Date: 06/08/2017 PT Individual Time: 1130-1200 PT Individual Time Calculation (min): 30 min   Short Term Goals: Week 2:  PT Short Term Goal 1 (Week 2): Pt will negotiate 4 steps with R rail only with min assist.  PT Short Term Goal 2 (Week 2): Pt will ambulate 50 ft with supervision assist with LRAD.  Skilled Therapeutic Interventions/Progress Updates:    Pt presents in enclosure bed and declines leaving room or getting out of bed due to fatigue and having done too much already this morning. Pt able to perform bed mobility throughout session without assist and repositioning as he felt comfortable (ie. Supine <> sit, sidelying, propping LE's on chair and adjusting pillows). Engaged in therapeutic discussion, cognitive remediation and education in regards to awareness and insight into deficits and d/c planning. Pt denies any concerns in regards to d/c and is hoping to go home asap and return back to work. He states his goal is to get home to spend time with his children and return to work. Pt unable to identify any modifications he would need to make in his daily schedule at home or what barriers to returning to work might be. Despite education by PT about overall endurance, memory impairments, or safety, pt denies this would affect him at home or his work setting. Reports only his anger may be an issue. Pt does state that it he plans to change his ways and not ride motorcycles in the future. Pt oriented to situation, place, and self but not to time. Reoriented by therapist. Pt hyperverbal and tangential throughout discussion and requires redirection to task. Agreeable for return to enclosure bed until his wife arrived. Call bell in reach.   Therapy Documentation Precautions:  Precautions Precautions: Fall, Cervical Precaution Comments: monitor vitals Required Braces or  Orthoses: (Cervical collar d/c 1/7) Cervical Brace: (DC hard collar) Restrictions Weight Bearing Restrictions: No    Pain:  No reports of pain.   See Function Navigator for Current Functional Status.   Therapy/Group: Individual Therapy  Karolee StampsGray, Jaquan Sadowsky Darrol PokeBrescia  Kharma Sampsel B. Kholton Coate, PT, DPT  06/08/2017, 12:11 PM

## 2017-06-08 NOTE — Progress Notes (Addendum)
Subjective/Complaints: Up working with PT. Anxious to get home. Pain better overall. Still complains of right shoulder pain at times, but didn'Singleton mention until I initiated discussion about it  ROS: pt denies nausea, vomiting, diarrhea, cough, shortness of breath or chest pain   Objective: Vital Signs: Blood pressure 100/68, pulse 88, temperature 97.9 F (36.6 C), temperature source Oral, resp. rate 18, height 6\' 5"  (1.956 m), weight 78.5 kg (173 lb 1 oz), SpO2 100 %. No results found. No results found for this or any previous visit (from the past 72 hour(s)).   HEENT: Normocephalic. Atraumatic. Cardio: RRR without murmur. No JVD       Resp: CTA Bilaterally without wheezes or rales. Normal effort   GI: BS positive andND Skin:   Intact. Warm and dry. Neuro: Alert  Able to move all 4 extremities  Musc/Skel: No edema in extremities. No visible discomfort in right shoulder or neck when performing tasks with arms at eye level.  Gen NAD. Vital signs reviewed. Psych: Agitated, inappropriate.  Assessment/Plan: 1. Functional deficits secondary to TBI skull fracture which require 3+ hours per day of interdisciplinary therapy in a comprehensive inpatient rehab setting. Physiatrist is providing close team supervision and 24 hour management of active medical problems listed below. Physiatrist and rehab team continue to assess barriers to discharge/monitor patient progress toward functional and medical goals. FIM: Function - Bathing Position: Shower Body parts bathed by patient: Right arm, Left arm, Chest, Abdomen, Front perineal area, Buttocks, Right upper leg, Left upper leg, Right lower leg, Left lower leg Body parts bathed by helper: Back Bathing not applicable: Back Assist Level: Touching or steadying assistance(Pt > 75%)  Function- Upper Body Dressing/Undressing What is the patient wearing?: Pull over shirt/dress Pull over shirt/dress - Perfomed by patient: Thread/unthread right sleeve,  Thread/unthread left sleeve, Put head through opening, Pull shirt over trunk Assist Level: Set up, Supervision or verbal cues Set up : To obtain clothing/put away Function - Lower Body Dressing/Undressing What is the patient wearing?: Shoes, Pants, Underwear, Socks Position: Other (comment)(sitting on toilet and standing near toilet) Underwear - Performed by patient: Thread/unthread right underwear leg, Thread/unthread left underwear leg, Pull underwear up/down Pants- Performed by patient: Thread/unthread right pants leg, Thread/unthread left pants leg, Pull pants up/down Socks - Performed by patient: Don/doff right sock, Don/doff left sock Shoes - Performed by patient: Don/doff right shoe, Don/doff left shoe Shoes - Performed by helper: Don/doff right shoe, Don/doff left shoe, Fasten right, Fasten left Assist for footwear: Supervision/touching assist Assist for lower body dressing: Touching or steadying assistance (Pt > 75%)  Function - Toileting Toileting steps completed by patient: Adjust clothing prior to toileting, Performs perineal hygiene, Adjust clothing after toileting Toileting steps completed by helper: Adjust clothing after toileting Toileting Assistive Devices: Grab bar or rail Assist level: Supervision or verbal cues  Function Programmer, multimedia transfer activity did not occur: Safety/medical concerns Toilet transfer assistive device: Grab bar Assist level to toilet: Supervision or verbal cues Assist level from toilet: Supervision or verbal cues  Function - Chair/bed transfer Chair/bed transfer method: Ambulatory Chair/bed transfer assist level: Supervision or verbal cues Chair/bed transfer assistive device: Armrests Chair/bed transfer details: Verbal cues for technique, Verbal cues for precautions/safety  Function - Locomotion: Wheelchair Will patient use wheelchair at discharge?: No Type: Manual Assist Level: Dependent (Pt equals 0%) Assist Level: Dependent  (Pt equals 0%) Assist Level: Dependent (Pt equals 0%) Function - Locomotion: Ambulation Ambulation activity did not occur: Safety/medical concerns Assistive device: No  device Max distance: 300 ft Assist level: Touching or steadying assistance (Pt > 75%) Assist level: Touching or steadying assistance (Pt > 75%) Assist level: Touching or steadying assistance (Pt > 75%) Walk 150 feet activity did not occur: Safety/medical concerns Assist level: Touching or steadying assistance (Pt > 75%) Walk 10 feet on uneven surfaces activity did not occur: Safety/medical concerns Assist level: Touching or steadying assistance (Pt > 75%)  Function - Comprehension Comprehension: Auditory Comprehension assist level: Understands basic 50 - 74% of the time/ requires cueing 25 - 49% of the time  Function - Expression Expression: Verbal Expression assist level: Expresses basic 25 - 49% of the time/requires cueing 50 - 75% of the time. Uses single words/gestures.  Function - Social Interaction Social Interaction assist level: Interacts appropriately 50 - 74% of the time - May be physically or verbally inappropriate.  Function - Problem Solving Problem solving assist level: Solves basic 25 - 49% of the time - needs direction more than half the time to initiate, plan or complete simple activities  Function - Memory Memory assist level: Recognizes or recalls 25 - 49% of the time/requires cueing 50 - 75% of the time Patient normally able to recall (first 3 days only): That he or she is in a hospital, Staff names and faces  Medical Problem List and Plan: 1.  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  DVT Prophylaxis/Anticoagulation: Mechanical: Sequential compression devices, below knee Bilateral lower extremities 3. Headaches/ Pain Management: tylenol   -Headaches appear improved.      -X-ray right shoulder  unremarkable  for fracture continue muscle rub for muscular component of pain 4. Mood: Monitor as mentation improves. LCSW to follow along for evaluation and support when appropriate.  5. Neuropsych: This patient is not capable of making decisions on his own behalf.   - continue ritalin to improve attention day/night cycle     -Weaning daytime Seroquel to off   -Continues to require enclosure bed due to safety issues 6. Skin/Wound Care: routine pressure relief measures.  7. Fluids/Electrolytes/Nutrition: Encourage p.o. Intake   -BMP within normal limits on 1/11 8. Seizures: Continue Keppra bid.  9. Dysphagia: Encourage appropriate swallowing strategies..    Advanced regular diet with full supervision 10. HTN: Monitor   Patient states he removed catapres TTS patch and unable to locate, BP controlled/low at present will not replace  11. Tachycardia: Improved, continues on metoprolol 100 mg twice daily, only given 25 mg on 1/12 due to low BP Vitals:   06/08/17 0443 06/08/17 0751  BP: 98/74 100/68  Pulse: 81 88  Resp: 18   Temp: 97.9 F (36.6 C)   SpO2: 100%      Relatively controlled on 1/14---decrease metoprolol to 50mg  bid 12. ABLA:   Hgb 12.1 on 1/11.   Added multivitamin with iron.  13.  Sleep: Improving             -  sleep-wake chart to continue             -Decreased am Seroquel dose to 50 mg bid   200 mg at 8pm,    -Continue trazodone at bedtime 14.  Hypoalbuminemia- good po intake 15. 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 Per reports--tinnitus was worsening as well as problems with equilibrium since thanksgiving-question Meniere's disease.    16.  C Spine CT negative  LOS (Days) 10 A FACE TO FACE EVALUATION WAS PERFORMED  Anthony Singleton 06/08/2017, 9:15 AM

## 2017-06-09 ENCOUNTER — Inpatient Hospital Stay (HOSPITAL_COMMUNITY): Payer: BLUE CROSS/BLUE SHIELD | Admitting: Physical Therapy

## 2017-06-09 ENCOUNTER — Inpatient Hospital Stay (HOSPITAL_COMMUNITY): Payer: BLUE CROSS/BLUE SHIELD | Admitting: Speech Pathology

## 2017-06-09 ENCOUNTER — Inpatient Hospital Stay (HOSPITAL_COMMUNITY): Payer: BLUE CROSS/BLUE SHIELD

## 2017-06-09 MED ORDER — METOPROLOL TARTRATE 25 MG PO TABS
25.0000 mg | ORAL_TABLET | Freq: Two times a day (BID) | ORAL | Status: DC
  Administered 2017-06-09 – 2017-06-11 (×4): 25 mg via ORAL
  Filled 2017-06-09 (×4): qty 1

## 2017-06-09 MED ORDER — CLONIDINE HCL 0.1 MG/24HR TD PTWK
0.1000 mg | MEDICATED_PATCH | TRANSDERMAL | Status: DC
Start: 2017-06-09 — End: 2017-06-13
  Administered 2017-06-09: 0.1 mg via TRANSDERMAL
  Filled 2017-06-09: qty 1

## 2017-06-09 MED ORDER — CLONIDINE HCL 0.1 MG/24HR TD PTWK: 0.1000 mg | MEDICATED_PATCH | TRANSDERMAL | Status: DC

## 2017-06-09 MED ORDER — METHYLPHENIDATE HCL 5 MG PO TABS
5.0000 mg | ORAL_TABLET | Freq: Two times a day (BID) | ORAL | Status: DC
  Administered 2017-06-09 – 2017-06-13 (×10): 5 mg via ORAL
  Filled 2017-06-09 (×10): qty 1

## 2017-06-09 NOTE — Progress Notes (Signed)
Physical Therapy Session Note  Patient Details  Name: Anthony Singleton MRN: [id number] Date of Birth: Dec 05, 1970  Today's Date: 06/09/2017 PT Individual Time: 6431-4276 PT Individual Time Calculation (min): 56 min   Short Term Goals: Week 2:  PT Short Term Goal 1 (Week 2): Pt will negotiate 4 steps with R rail only with min assist.  PT Short Term Goal 2 (Week 2): Pt will ambulate 50 ft with supervision assist with LRAD.  Skilled Therapeutic Interventions/Progress Updates:   Pt supine and agreeable to therapy, headache pain as detailed below. Worked on dynamic standing balance and dynamic gait this session. RN reporting pt's bed switching from enclosure bed, pt transferred to standing w/ supervision and ambulated around room w/ supervision while cleaning belongings from enclosure bed. Ambulated around unit in 150-300' bouts w/ supervision performing dynamic gait tasks including kicking or carrying beach ball, scanning the environment, performing cognitive task. Performed dynamic standing task, matching card, while on compliant surface w/ supervision - emphasis on reaching below and high up while maintaining balance. Frequent verbal cues to attend to task and occasional verbal cues to redirect perseveration on wife visiting or family. Returned to room and provided supervision level assist while pt drank soda and ate snack, verbal cues to chew and swallow before speaking. Ended session in supine, call bell within reach and all needs met.   Therapy Documentation Precautions:  Precautions Precautions: Fall, Cervical Precaution Comments: monitor vitals Required Braces or Orthoses: (Cervical collar d/c 1/7) Cervical Brace: (DC hard collar) Restrictions Weight Bearing Restrictions: No Vital Signs: Therapy Vitals Temp: 98.7 F (37.1 C) Temp Source: Oral Pulse Rate: (!) 113 Resp: 18 BP: 130/86 Patient Position (if appropriate): Sitting Oxygen Therapy SpO2: 100 % O2 Device: Not  Delivered Pain: Pain Assessment Pain Assessment: 0-10 Pain Score: 8  Pain Type: Acute pain Pain Location: Head Pain Orientation: Right;Left;Anterior;Posterior Pain Radiating Towards: right shoulder Pain Descriptors / Indicators: Headache Pain Frequency: Intermittent Pain Onset: On-going Patients Stated Pain Goal: 3 Pain Intervention(s): Medication (See eMAR)  See Function Navigator for Current Functional Status.   Therapy/Group: Individual Therapy  Anthony Singleton 06/09/2017, 4:22 PM

## 2017-06-09 NOTE — Progress Notes (Signed)
Occupational Therapy Session Note  Patient Details  Name: Anthony Singleton MRN: [id number] Date of Birth: [birth year]  Today's Date: 06/09/2017 OT Individual Time: 0800-0900 OT Individual Time Calculation (min): 60 min    Short Term Goals: Week 2:  OT Short Term Goal 1 (Week 2): Pt will complete bathroom transfers at ambulatory level with supervision  OT Short Term Goal 2 (Week 2): Pt will initiate gathering of clothing items in prep for ADLs with questioning cues OT Short Term Goal 3 (Week 2): Pt will complete bathing with supervision   Skilled Therapeutic Interventions/Progress Updates:    Pt sitting EOB upon arrival eating breakfast with wife present.  Pt continued eating breakfast with min verbal cues to attend to task.  Pt hyperverbal and requires min verbal cues to be silent during tasks.  Pt agreed to shower and and in room to gather clothing before amb into bathroom for shower.  Pt required min verbal cues to initiate washing hair and using soap for bathing.  Pt doffed and donned clothing while standing using wall as support and doffing/donning pants. Pt returned to recliner.  Pt agreeable to returning to enclosure bed at end of session, stating "you know I can get out if I really want to?"  Therapy Documentation Precautions:  Precautions Precautions: Fall, Cervical Precaution Comments: monitor vitals Required Braces or Orthoses: (Cervical collar d/c 1/7) Cervical Brace: (DC hard collar) Restrictions Weight Bearing Restrictions: No Pain: Pt denies pain  See Function Navigator for Current Functional Status.   Therapy/Group: Individual Therapy  Rich BraveLanier, [name] 06/09/2017, 9:23 AM

## 2017-06-09 NOTE — Progress Notes (Addendum)
Subjective/Complaints: Woke up this morning with right shoulder pain. Slept on stomach last night? Went through yesterday with minimal shoulder complaiints  ROS: Limited due to cognitive/behavioral    Objective: Vital Signs: Blood pressure (!) 94/57, pulse 70, temperature 97.6 F (36.4 C), temperature source Oral, resp. rate 18, height 6\' 5"  (1.956 m), weight 78.5 kg (173 lb 1 oz), SpO2 99 %. No results found. No results found for this or any previous visit (from the past 72 hour(s)).   HEENT: Normocephalic. Atraumatic. Cardio: RRR without murmur. No JVD       Resp: CTA Bilaterally without wheezes or rales. Normal effort   GI: BS positive andND Skin:   Intact. Warm and dry. Neuro: Alert  Able to move all 4 extremities  Musc/Skel: No edema in extremities. No visible discomfort in right shoulder or neck when performing tasks with arms at eye level.  Gen NAD. Vital signs reviewed. Psych: Agitated, inappropriate.  Assessment/Plan: 1. Functional deficits secondary to TBI skull fracture which require 3+ hours per day of interdisciplinary therapy in a comprehensive inpatient rehab setting. Physiatrist is providing close team supervision and 24 hour management of active medical problems listed below. Physiatrist and rehab team continue to assess barriers to discharge/monitor patient progress toward functional and medical goals. FIM: Function - Bathing Position: Shower Body parts bathed by patient: Right arm, Left arm, Chest, Abdomen, Front perineal area, Buttocks, Right upper leg, Left upper leg, Right lower leg, Left lower leg Body parts bathed by helper: Back Bathing not applicable: Back Assist Level: Touching or steadying assistance(Pt > 75%)  Function- Upper Body Dressing/Undressing What is the patient wearing?: Pull over shirt/dress Pull over shirt/dress - Perfomed by patient: Thread/unthread right sleeve, Thread/unthread left sleeve, Put head through opening, Pull shirt over  trunk Assist Level: Set up, Supervision or verbal cues Set up : To obtain clothing/put away Function - Lower Body Dressing/Undressing What is the patient wearing?: Shoes, Pants, Underwear, Socks Position: Other (comment)(sitting on toilet and standing near toilet, and also in bedside recliner) Underwear - Performed by patient: Thread/unthread right underwear leg, Thread/unthread left underwear leg, Pull underwear up/down Pants- Performed by patient: Thread/unthread right pants leg, Thread/unthread left pants leg, Pull pants up/down Socks - Performed by patient: Don/doff right sock, Don/doff left sock Shoes - Performed by patient: Don/doff right shoe, Don/doff left shoe, Fasten right, Fasten left Shoes - Performed by helper: Don/doff right shoe, Don/doff left shoe, Fasten right, Fasten left Assist for footwear: Supervision/touching assist Assist for lower body dressing: Touching or steadying assistance (Pt > 75%)  Function - Toileting Toileting steps completed by patient: Adjust clothing prior to toileting, Performs perineal hygiene, Adjust clothing after toileting Toileting steps completed by helper: Adjust clothing after toileting Toileting Assistive Devices: Grab bar or rail Assist level: Supervision or verbal cues  Function Programmer, multimedia- Toilet Transfers Toilet transfer activity did not occur: Safety/medical concerns Toilet transfer assistive device: Grab bar Assist level to toilet: Supervision or verbal cues Assist level from toilet: Supervision or verbal cues  Function - Chair/bed transfer Chair/bed transfer method: Ambulatory Chair/bed transfer assist level: Supervision or verbal cues Chair/bed transfer assistive device: Armrests Chair/bed transfer details: Verbal cues for technique, Verbal cues for precautions/safety  Function - Locomotion: Wheelchair Will patient use wheelchair at discharge?: No Type: Manual Assist Level: Dependent (Pt equals 0%) Assist Level: Dependent (Pt equals  0%) Assist Level: Dependent (Pt equals 0%) Function - Locomotion: Ambulation Ambulation activity did not occur: Safety/medical concerns Assistive device: No device Max distance: 300 ft  Assist level: Touching or steadying assistance (Pt > 75%) Assist level: Touching or steadying assistance (Pt > 75%) Assist level: Touching or steadying assistance (Pt > 75%) Walk 150 feet activity did not occur: Safety/medical concerns Assist level: Touching or steadying assistance (Pt > 75%) Walk 10 feet on uneven surfaces activity did not occur: Safety/medical concerns Assist level: Touching or steadying assistance (Pt > 75%)  Function - Comprehension Comprehension: Auditory Comprehension assist level: Follows basic conversation/direction with no assist  Function - Expression Expression: Verbal Expression assist level: Expresses basic 90% of the time/requires cueing < 10% of the time.  Function - Social Interaction Social Interaction assist level: Interacts appropriately 50 - 74% of the time - May be physically or verbally inappropriate.  Function - Problem Solving Problem solving assist level: Solves basic 50 - 74% of the time/requires cueing 25 - 49% of the time  Function - Memory Memory assist level: Recognizes or recalls 50 - 74% of the time/requires cueing 25 - 49% of the time Patient normally able to recall (first 3 days only): That he or she is in a hospital, Staff names and faces  Medical Problem List and Plan: 1.  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  DVT Prophylaxis/Anticoagulation: Mechanical: Sequential compression devices, below knee Bilateral lower extremities 3. Headaches/ Pain Management: tylenol   -Headaches appear improved.      -X-ray right shoulder  unremarkable for fracture continue muscle rub for muscular component of pain   -kpad   -perseveration factor  too 4. Mood: Monitor as mentation improves. LCSW to follow along for evaluation and support when appropriate.  5. Neuropsych: This patient is not capable of making decisions on his own behalf.   - continue ritalin to improve attention day/night cycle     -Weaning daytime Seroquel to off   -Continues to require enclosure bed due to safety issues.  Would like to liberate from bed soon 6. Skin/Wound Care: routine pressure relief measures.  7. Fluids/Electrolytes/Nutrition: Encourage p.o. Intake   -BMP within normal limits on 1/11 8. Seizures: Continue Keppra bid.  9. Dysphagia: Encourage appropriate swallowing strategies..    Advanced regular diet with full supervision 10. HTN: Blood pressure is actually low right now  11. Tachycardia: Weaning metoprolol to off.  Will reduce to 25 mg at evening dose Vitals:   06/08/17 2018 06/09/17 0604  BP: 124/80 (!) 94/57  Pulse: 72 70  Resp:  18  Temp:  97.6 F (36.4 C)  SpO2:         12. ABLA:   Hgb 12.1 on 1/11.   Added multivitamin with iron.  13.  Sleep: Improving             -  sleep-wake chart to continue             -Decreased am Seroquel dose to 50 mg bid   200 mg at 8pm,    -Continue trazodone at bedtime 14.  Hypoalbuminemia- good po intake 15. 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 Per reports--tinnitus was worsening as well as problems with equilibrium since thanksgiving-question Meniere's disease.    16.  C Spine CT negative  LOS (Days) 11 A FACE TO FACE EVALUATION WAS PERFORMED  SWARTZ,ZACHARY T 06/09/2017, 8:50 AM

## 2017-06-09 NOTE — Progress Notes (Signed)
Physical Therapy Session Note  Patient Details  Name: Anthony Singleton MRN: [id number] Date of Birth: November 21, 1970  Today's Date: 06/09/2017 PT Individual Time: 1000-1100 PT Individual Time Calculation (min): 60 min   Short Term Goals: Week 2:  PT Short Term Goal 1 (Week 2): Pt will negotiate 4 steps with R rail only with min assist.  PT Short Term Goal 2 (Week 2): Pt will ambulate 50 ft with supervision assist with LRAD.  Skilled Therapeutic Interventions/Progress Updates:    Pt was greeted lying in open veil bed with wife present however she left at the beginning of the session. Pt was agreeable to therapy with little  encouragement, transitioned supine to sit EOB with supervision, pt reports dizziness in sitting that subsides after a few minutes. Pt dons shoes with set-up assistance, sit<> stand with supervision for safety. Pt ambulates 150 feet to therapy gym, bounces and catches basketball off rebounder with varying stance: narrow base on floor, single leg stance on floor with LOB to side and able to self-correct, narrow stance on foam, and staggered stance on foam. Performs forward, backward, braiding, and side stepping with ball toss 5450' for each, intermittent LOB but able to self-correct. Pt requires short seated rest break, completes 3 minutes on nustep before losing interest, ambulates up and down 14 steps with S using both handrails, while therapist sets up scavenger hunt throughout apartment gym, ortho gym, and nursing stations. Pt ambulates approximately 300' to retrieve 10 scavenger hunt items, including stooping, reaching overhead, visual scanning, opening doors, and following directional cues. Pt c/o limited vision throughout task, requires frequent redirection to focus to task. Pt returned to room, handed off to Ed[place]ney, SLP sitting upright in bedside chair, denies any needs at this time.   Therapy Documentation Precautions:  Precautions Precautions: Fall,  Cervical Precaution Comments: monitor vitals Required Braces or Orthoses: (Cervical collar d/c 1/7) Cervical Brace: (DC hard collar) Restrictions Weight Bearing Restrictions: No   See Function Navigator for Current Functional Status.   Therapy/Group: Individual Therapy  Theodosia QuayMorgan Dammon Makarewicz 06/09/2017, 12:07 PM

## 2017-06-09 NOTE — Progress Notes (Signed)
Physical Therapy Session Note  Patient Details  Name: Anthony Singleton MRN: [id number] Date of Birth: 08-Apr-1971  Today's Date: 06/09/2017 PT Individual Time: 8241-7530 PT Individual Time Calculation (min): 25 min   Short Term Goals: Week 2:  PT Short Term Goal 1 (Week 2): Pt will negotiate 4 steps with R rail only with min assist.  PT Short Term Goal 2 (Week 2): Pt will ambulate 50 ft with supervision assist with LRAD.  Skilled Therapeutic Interventions/Progress Updates: Pt presented in enclosure bed requiring some encouragement to participate in therapy due to refusal to ambulate. Pt indicating pain in head/shoulders and verbalized that received pain meds from nsg.  Participated in cognitive remediation with PTA asking pt to recall activities of earlier therapies today. Pt able to recall 2 activities of PT session, and able to recall other activities with min cues. Participated in meaningful conservation of family members with pt pointing out and naming children on picture board. Asked pt if any obstacles regarding d/c however pt with poor insight into deficits noting will have "no problems" once home. Discussed possible deficits as pt was able to recall that he worked on balance earlier however unable to balance deficits ans safety at home. Pt returned to enclosure bed and left with current needs met.      Therapy Documentation Precautions:  Precautions Precautions: Fall, Cervical Precaution Comments: monitor vitals Required Braces or Orthoses: (Cervical collar d/c 1/7) Cervical Brace: (DC hard collar) Restrictions Weight Bearing Restrictions: No General:   Vital Signs: Therapy Vitals Temp: 98.7 F (37.1 C) Temp Source: Oral Pulse Rate: (!) 113 Resp: 18 BP: 130/86 Patient Position (if appropriate): Sitting Oxygen Therapy SpO2: 100 % O2 Device: Not Delivered Pain: Pain Assessment Pain Assessment: 0-10 Pain Score: 7  Pain Type: Acute pain Pain Location: Head Pain  Orientation: Right;Left Pain Descriptors / Indicators: Headache Pain Frequency: Intermittent Pain Onset: Gradual Patients Stated Pain Goal: 1 Pain Intervention(s): Medication (See eMAR)  See Function Navigator for Current Functional Status.   Therapy/Group: Individual Therapy  Kaavya Puskarich  Shirlette Scarber, PTA  06/09/2017, 3:21 PM

## 2017-06-09 NOTE — Plan of Care (Signed)
Pt is [facility] Description Description: See Patient Education module for eduction specifics 06/09/2017 0646 - Progressing by Mason JimKooy, Birdena Kingma, RN   RH SKIN INTEGRITY RH STG SKIN FREE OF INFECTION/BREAKDOWN Description Skin to remain free from infection and breakdown with Min assist  06/09/2017 0646 - Progressing by Mason JimKooy, Lizzie Cokley, RN RH STG MAINTAIN SKIN INTEGRITY WITH ASSISTANCE Description STG Maintain Skin Integrity With min Assistance.  06/09/2017 0646 - Progressing by Mason JimKooy, Ivie Maese, RN   RH SAFETY RH STG ADHERE TO SAFETY PRECAUTIONS W/ASSISTANCE/DEVICE Description STG Adhere to Safety Precautions With mod Assistance/Device.  06/09/2017 0646 - Progressing by Mason JimKooy, Chrishon Martino, RN   RH COGNITION-NURSING RH STG USES MEMORY AIDS/STRATEGIES W/ASSIST TO PROBLEM SOLVE Description STG Uses Memory Aids/Strategies With mod Assistance to Problem Solve.  06/09/2017 0646 - Progressing by Mason JimKooy, Vylet Maffia, RN   RH PAIN MANAGEMENT RH STG PAIN MANAGED AT OR BELOW PT'S PAIN GOAL Description 3 or less  06/09/2017 0646 - Progressing by Mason JimKooy, Corderro Koloski, RN

## 2017-06-09 NOTE — Progress Notes (Signed)
Speech Language Pathology Daily Session Note  Patient Details  Name: Anthony Singleton MRN: [id number] Date of Birth: 11-09-1970  Today's Date: 06/09/2017 SLP Individual Time: 1105-1200 SLP Individual Time Calculation (min): 55 min  Short Term Goals: Week 2: SLP Short Term Goal 1 (Week 2): Patient will consume current diet with minimal overt s/s of aspiration with Min A verbal cues for use of swallowing compensatory strategies.  SLP Short Term Goal 2 (Week 2): Patient will demonstrate efficient mastication without overt s/s of aspiration with regular textures over 2 sessions prior to upgrade.  SLP Short Term Goal 3 (Week 2): Patient will demonstrate functional problem solving for basic and familiar tasks with Max A verbal cues.  SLP Short Term Goal 4 (Week 2): Patient will verbalize 1 cognitive and 1 physical deficit with Mod A verbal cues.  SLP Short Term Goal 5 (Week 2): Patient will demonstrate sustained attention to a functional task for ~10 minutes with Min A verbal cues for redirection.   Skilled Therapeutic Interventions: Skilled treatment session focused on cognitive and dysphagia goals. SLP facilitated session by providing Min A verbal cues for recall of procedures to a previously learned task (yesterday's session). Patient also required supervision verbal cues for problem solving with task. Patient consumed lunch meal of regular textures with thin liquids with an intermittent wet vocal quality that patient independently self-monitored and corrected, suspect due to mixed consistencies and talking with a full oral cavity. Recommend patient continue current diet. Patient left supine in enclosure bed with all needs within reach. Continue with current plan of care.      Function:  Eating Eating   Modified Consistency Diet: No Eating Assist Level: Swallowing techniques: self managed           Cognition Comprehension Comprehension assist level: Follows basic  conversation/direction with no assist  Expression   Expression assist level: Expresses basic 90% of the time/requires cueing < 10% of the time.  Social Interaction Social Interaction assist level: Interacts appropriately 50 - 74% of the time - May be physically or verbally inappropriate.  Problem Solving Problem solving assist level: Solves basic 75 - 89% of the time/requires cueing 10 - 24% of the time  Memory Memory assist level: Recognizes or recalls 75 - 89% of the time/requires cueing 10 - 24% of the time    Pain Pain Assessment Pain Assessment: 0-10 Pain Score: 6  Faces Pain Scale: No hurt Pain Type: Acute pain Pain Location: Head Pain Orientation: Right;Left Pain Radiating Towards: right shoulder Pain Descriptors / Indicators: Headache Pain Frequency: Intermittent Pain Onset: Gradual Patients Stated Pain Goal: 1 Pain Intervention(s): Medication (See eMAR)  Therapy/Group: Individual Therapy  Anthony Singleton 06/09/2017, 12:40 PM

## 2017-06-10 ENCOUNTER — Inpatient Hospital Stay (HOSPITAL_COMMUNITY): Payer: BLUE CROSS/BLUE SHIELD | Admitting: Physical Therapy

## 2017-06-10 ENCOUNTER — Inpatient Hospital Stay (HOSPITAL_COMMUNITY): Payer: BLUE CROSS/BLUE SHIELD | Admitting: Occupational Therapy

## 2017-06-10 ENCOUNTER — Inpatient Hospital Stay (HOSPITAL_COMMUNITY): Payer: BLUE CROSS/BLUE SHIELD | Admitting: Speech Pathology

## 2017-06-10 ENCOUNTER — Encounter (HOSPITAL_COMMUNITY): Payer: BLUE CROSS/BLUE SHIELD | Admitting: Psychology

## 2017-06-10 MED ORDER — QUETIAPINE FUMARATE 25 MG PO TABS
25.0000 mg | ORAL_TABLET | Freq: Every day | ORAL | Status: DC
  Administered 2017-06-11: 25 mg via ORAL
  Filled 2017-06-10: qty 1

## 2017-06-10 MED ORDER — QUETIAPINE FUMARATE 50 MG PO TABS
150.0000 mg | ORAL_TABLET | Freq: Every day | ORAL | Status: DC
  Administered 2017-06-10: 21:00:00 150 mg via ORAL
  Filled 2017-06-10: qty 1

## 2017-06-10 NOTE — Progress Notes (Signed)
Subjective/Complaints: Patient states that he had a headache last night that seemed to start in his left neck.  It is feeling better this morning.  Right shoulder is a bit sore but improved from yesterday.  ROS: pt denies nausea, vomiting, diarrhea, cough, shortness of breath or chest pain   Objective: Vital Signs: Blood pressure 133/86, pulse 87, temperature 98.3 F (36.8 C), temperature source Oral, resp. rate 20, height 6\' 5"  (1.956 m), weight 78.5 kg (173 lb 1 oz), SpO2 100 %. No results found. No results found for this or any previous visit (from the past 72 hour(s)).   HEENT: Normocephalic. Atraumatic. Cardio: Regular rate       Resp: Chest clear with normal effort GI: BS positive andND Skin:   Intact. Warm and dry. Neuro: Patient is alert.  Cranial nerve exam is nonfocal.  Attention concentration is improving.  He remembers me and events from therapy yesterday. Musc/Skel: Minimal right shoulder pain this morning Gen NAD. Vital signs reviewed. Psych: Patient is very pleasant and jovial today.  Assessment/Plan: 1. Functional deficits secondary to TBI skull fracture which require 3+ hours per day of interdisciplinary therapy in a comprehensive inpatient rehab setting. Physiatrist is providing close team supervision and 24 hour management of active medical problems listed below. Physiatrist and rehab team continue to assess barriers to discharge/monitor patient progress toward functional and medical goals. FIM: Function - Bathing Position: Shower Body parts bathed by patient: Right arm, Left arm, Chest, Abdomen, Front perineal area, Buttocks, Right upper leg, Left upper leg, Right lower leg, Left lower leg Body parts bathed by helper: Back Bathing not applicable: Back Assist Level: Supervision or verbal cues  Function- Upper Body Dressing/Undressing What is the patient wearing?: Pull over shirt/dress Pull over shirt/dress - Perfomed by patient: Thread/unthread right sleeve,  Thread/unthread left sleeve, Put head through opening, Pull shirt over trunk Assist Level: Set up, Supervision or verbal cues Set up : To obtain clothing/put away Function - Lower Body Dressing/Undressing What is the patient wearing?: Underwear, Pants, Socks, Shoes Position: Sitting EOB Underwear - Performed by patient: Thread/unthread right underwear leg, Thread/unthread left underwear leg, Pull underwear up/down Pants- Performed by patient: Thread/unthread right pants leg, Thread/unthread left pants leg, Pull pants up/down Socks - Performed by patient: Don/doff right sock, Don/doff left sock Shoes - Performed by patient: Don/doff right shoe, Don/doff left shoe Shoes - Performed by helper: Don/doff right shoe, Don/doff left shoe, Fasten right, Fasten left Assist for footwear: Supervision/touching assist Assist for lower body dressing: Supervision or verbal cues  Function - Toileting Toileting steps completed by patient: Adjust clothing prior to toileting, Performs perineal hygiene, Adjust clothing after toileting Toileting steps completed by helper: Adjust clothing after toileting Toileting Assistive Devices: Grab bar or rail Assist level: Supervision or verbal cues  Function Programmer, multimedia- Toilet Transfers Toilet transfer activity did not occur: Safety/medical concerns Toilet transfer assistive device: Grab bar Assist level to toilet: Supervision or verbal cues Assist level from toilet: Supervision or verbal cues  Function - Chair/bed transfer Chair/bed transfer method: Ambulatory Chair/bed transfer assist level: Supervision or verbal cues Chair/bed transfer assistive device: Armrests Chair/bed transfer details: Verbal cues for precautions/safety  Function - Locomotion: Wheelchair Will patient use wheelchair at discharge?: No Type: Manual Assist Level: Dependent (Pt equals 0%) Assist Level: Dependent (Pt equals 0%) Assist Level: Dependent (Pt equals 0%) Function - Locomotion:  Ambulation Ambulation activity did not occur: Safety/medical concerns Assistive device: No device Max distance: >150 ft Assist level: Supervision or verbal cues Assist  level: Supervision or verbal cues Assist level: Supervision or verbal cues Walk 150 feet activity did not occur: Safety/medical concerns Assist level: Supervision or verbal cues Walk 10 feet on uneven surfaces activity did not occur: Safety/medical concerns Assist level: Supervision or verbal cues  Function - Comprehension Comprehension: Auditory Comprehension assist level: Follows basic conversation/direction with no assist  Function - Expression Expression: Verbal Expression assist level: Expresses basic 90% of the time/requires cueing < 10% of the time.  Function - Social Interaction Social Interaction assist level: Interacts appropriately 50 - 74% of the time - May be physically or verbally inappropriate.  Function - Problem Solving Problem solving assist level: Solves basic 75 - 89% of the time/requires cueing 10 - 24% of the time  Function - Memory Memory assist level: Recognizes or recalls 75 - 89% of the time/requires cueing 10 - 24% of the time Patient normally able to recall (first 3 days only): That he or she is in a hospital, Staff names and faces  Medical Problem List and Plan: 1.  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  DVT Prophylaxis/Anticoagulation: Mechanical: Sequential compression devices, below knee Bilateral lower extremities 3. Headaches/ Pain Management: tylenol   -Headaches are improving.  Would stay conservative with management for now.      -X-ray right shoulder  unremarkable for fracture continue muscle rub for muscular component of pain   -kpad   -perseveration factor too 4. Mood: Monitor as mentation improves. LCSW to follow along for evaluation and support when  appropriate.  5. Neuropsych: This patient is not capable of making decisions on his own behalf.   - continue ritalin to improve attention day/night cycle     -Weaning daytime Seroquel to off   -Continues to require enclosure bed due to safety issues.  Would like to liberate from bed soon 6. Skin/Wound Care: routine pressure relief measures.  7. Fluids/Electrolytes/Nutrition: Encourage p.o. Intake   -BMP within normal limits on 1/11 8. Seizures: Continue Keppra bid.  9. Dysphagia: Encourage appropriate swallowing strategies..    Advanced regular diet with full supervision 10. HTN: Blood pressure is actually low right now  11. Tachycardia: Metoprolol down to 25 mg twice daily.  Might be able to taper to off prior to discharge home Vitals:   06/09/17 2124 06/10/17 0250  BP: 133/86   Pulse: 87   Resp:  20  Temp:  98.3 F (36.8 C)  SpO2:  100%       12. ABLA:   Hgb 12.1 on 1/11.   Added multivitamin with iron.  13.  Sleep: Improving             -  sleep-wake chart to continue             -Decreased am Seroquel dose to 25 mg bid   200 mg at 8pm,    -Continue trazodone at bedtime 14.  Hypoalbuminemia- good po intake 15. 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 Per reports--tinnitus was worsening as well as problems with equilibrium since thanksgiving-question Meniere's disease.    16.  C Spine CT negative  LOS (Days) 12 A FACE TO FACE EVALUATION WAS PERFORMED  SWARTZ,ZACHARY T 06/10/2017, 1:35 PM
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
## 2017-06-10 NOTE — Progress Notes (Signed)
Physical Therapy Session Note  Patient Details  Name: Anthony Singleton MRN: [id number] Date of Birth: [birth year]  Today's Date: 06/10/2017 PT Individual Time: 1030-1200 PT Individual Time Calculation (min): 90 min   Short Term Goals: Week 2:  PT Short Term Goal 1 (Week 2): Pt will negotiate 4 steps with R rail only with min assist.  PT Short Term Goal 2 (Week 2): Pt will ambulate 50 ft with supervision assist with LRAD.  Skilled Therapeutic Interventions/Progress Updates:  Pt received in bed reporting HA & RN reports he is premedicated. Pt requires encouragement to transfer OOB but did so and then donned shoes with set up assist. Pt ambulates room<>gym and throughout unit without AD & close supervision<>min assist. Pt completed Berg Balance Test & scored 32/56. Patient demonstrates increased fall risk as noted by score of 32/56 on Berg Balance Scale.  (<36= high risk for falls, close to 100%; 37-45 significant >80%; 46-51 moderate >50%; 52-55 lower >25%). Educated pt on inability to drive until cleared by MD but pt with very poor awareness and insight. Pt hyper-verbose throughout session with max cuing to redirect to task. Pt assembled jigsaw puzzle without cuing for problem solving and error correction but cuing to focus on task at hand. In apartment pt found items in pantry to "purchase" under $20 and calculated remaining money with max cuing for calculations. Pt completed furniture transfers from rocking recliner and low, compliant couch with supervision. Pt completed car transfer at elevated SUV height as he reports his wife has a [place], and did so with supervision. Pt cleaned car for engagement in functional task with supervision. At end of session pt returned to room & left in bed with alarm set & all needs within reach.    Therapy Documentation Precautions:  Precautions Precautions: Fall, Cervical Precaution Comments: monitor vitals Required Braces or Orthoses: (Cervical collar  d/c 1/7) Cervical Brace: (DC hard collar) Restrictions Weight Bearing Restrictions: No    Balance: Balance Balance Assessed: Yes Standardized Balance Assessment Standardized Balance Assessment: Berg Balance Test Berg Balance Test Sit to Stand: Able to stand without using hands and stabilize independently Standing Unsupported: Able to stand 2 minutes with supervision Sitting with Back Unsupported but Feet Supported on Floor or Stool: Able to sit 2 minutes under supervision Stand to Sit: Sits safely with minimal use of hands Transfers: Able to transfer with verbal cueing and /or supervision Standing Unsupported with Eyes Closed: Able to stand 10 seconds with supervision Standing Ubsupported with Feet Together: Able to place feet together independently and stand for 1 minute with supervision From Standing, Reach Forward with Outstretched Arm: Reaches forward but needs supervision(reaches >10 inches but with supervision) From Standing Position, Pick up Object from Floor: Able to pick up shoe, needs supervision From Standing Position, Turn to Look Behind Over each Shoulder: Needs supervision when turning Turn 360 Degrees: Needs close supervision or verbal cueing Standing Unsupported, Alternately Place Feet on Step/Stool: Able to complete 4 steps without aid or supervision(completes 8 steps with supervision) Standing Unsupported, One Foot in Front: Able to take small step independently and hold 30 seconds Standing on One Leg: Unable to try or needs assist to prevent fall Total Score: 32    See Function Navigator for Current Functional Status.   Therapy/Group: Individual Therapy  [name] 06/10/2017, 12:38 PM

## 2017-06-10 NOTE — Progress Notes (Signed)
Social Work Patient ID: Anthony Singleton, male   DOB: September 18, 1970, 47 y.o.   MRN: [id number]   CSW met with pt and spoke with wife via telephone 06-09-17 to update them on team conference discussion.  Team feels pt can have his young children come visit when he is ready for them to come.  Pt originally wanted to be out of the enclosure bed before they came and pt was moved to a regular hospital bed after conference on 06-09-17.  [name] was slightly taken aback with d/c date of 06-13-17, but she is pleased and will prepare family.  She and other family members who plan to be with pt at home will come for family education over the coming days leading up to Saturday.  CSW can help wife schedule these so that family will be able to attend.  Pt will leave on Saturday after lunchtime, likely around 1:30/2 PM.  CSW will arrange follow up outpt therapies for pt and see if pt has the need for any DME.  CSW remains available to assist as needed.
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
## 2017-06-10 NOTE — Progress Notes (Signed)
Speech Language Pathology Daily Session Notes  Patient Details  Name: Anthony Singleton XXXWilliams MRN: [id number] Date of Birth: [birth year]  Today's Date: 06/10/2017  Session 1: SLP Individual Time: 0730-0800 SLP Individual Time Calculation (min): 30 min   Session 2: SLP Individual Time: 1300-1355 SLP Individual Time Calculation (min): 55 min  Short Term Goals: Week 2: SLP Short Term Goal 1 (Week 2): Patient will consume current diet with minimal overt s/s of aspiration with Min A verbal cues for use of swallowing compensatory strategies.  SLP Short Term Goal 2 (Week 2): Patient will demonstrate efficient mastication without overt s/s of aspiration with regular textures over 2 sessions prior to upgrade.  SLP Short Term Goal 3 (Week 2): Patient will demonstrate functional problem solving for basic and familiar tasks with Max A verbal cues.  SLP Short Term Goal 4 (Week 2): Patient will verbalize 1 cognitive and 1 physical deficit with Mod A verbal cues.  SLP Short Term Goal 5 (Week 2): Patient will demonstrate sustained attention to a functional task for ~10 minutes with Min A verbal cues for redirection.   Skilled Therapeutic Interventions:  Session 1: Skilled treatment session focused on initiation of family education with the patient and his wife. SLP facilitated session by providing education in regards to the patient's current cognitive function and strategies to utilize at home to maximize safety and minimize frustration and agitation. She verbalized understanding of all information but continued education is needed. Patient demonstrated divided attention between self-feeding and conversation with Mod I. Patient left EOB with wife present. Continue with current plan of care.   Session 2: Skilled treatment session focused on cognitive goals. SLP facilitated session by providing Min A verbal cues for patient to generate a list of activities patient can participate in safely at home. Patient  also demonstrated appropriate verbal problem solving for a variety of scenarios that could take place at home with Min A verbal cues. Patient left upright in bed with all needs within reach. Continue with current plan of care.   Function:   Cognition Comprehension Comprehension assist level: Follows basic conversation/direction with no assist  Expression   Expression assist level: Expresses basic 90% of the time/requires cueing < 10% of the time.  Social Interaction Social Interaction assist level: Interacts appropriately 50 - 74% of the time - May be physically or verbally inappropriate.  Problem Solving Problem solving assist level: Solves basic 75 - 89% of the time/requires cueing 10 - 24% of the time  Memory Memory assist level: Recognizes or recalls 75 - 89% of the time/requires cueing 10 - 24% of the time    Pain Pain Assessment Pain Assessment: 0-10 Pain Score: 6  Pain Type: Acute pain Pain Location: Head Pain Orientation: Posterior Pain Radiating Towards: neck Pain Descriptors / Indicators: Headache Pain Frequency: Constant Pain Onset: On-going Patients Stated Pain Goal: 2 Pain Intervention(s): Medication (See eMAR)  Therapy/Group: Individual Therapy  Kasch Borquez 06/10/2017, 9:43 AM

## 2017-06-11 ENCOUNTER — Inpatient Hospital Stay (HOSPITAL_COMMUNITY): Payer: BLUE CROSS/BLUE SHIELD | Admitting: Occupational Therapy

## 2017-06-11 ENCOUNTER — Inpatient Hospital Stay (HOSPITAL_COMMUNITY): Payer: BLUE CROSS/BLUE SHIELD | Admitting: Physical Therapy

## 2017-06-11 ENCOUNTER — Inpatient Hospital Stay (HOSPITAL_COMMUNITY): Payer: BLUE CROSS/BLUE SHIELD | Admitting: Speech Pathology

## 2017-06-11 MED ORDER — QUETIAPINE FUMARATE 100 MG PO TABS
100.0000 mg | ORAL_TABLET | Freq: Every day | ORAL | Status: DC
  Administered 2017-06-11: 100 mg via ORAL
  Filled 2017-06-11: qty 1

## 2017-06-11 MED ORDER — TOPIRAMATE 25 MG PO TABS
25.0000 mg | ORAL_TABLET | Freq: Every day | ORAL | Status: DC
  Administered 2017-06-11 – 2017-06-12 (×2): 25 mg via ORAL
  Filled 2017-06-11 (×2): qty 1

## 2017-06-11 NOTE — Progress Notes (Signed)
Occupational Therapy Session Note  Patient Details  Name: Anthony Singleton MRN: [id number] Date of Birth: [birth year]  Today's Date: 06/11/2017 OT Individual Time: 0900-1000 and 1330-1400 OT Individual Time Calculation (min): 60 min and 30 min   Short Term Goals: Week 2:  OT Short Term Goal 1 (Week 2): Pt will complete bathroom transfers at ambulatory level with supervision  OT Short Term Goal 2 (Week 2): Pt will initiate gathering of clothing items in prep for ADLs with questioning cues OT Short Term Goal 3 (Week 2): Pt will complete bathing with supervision   Skilled Therapeutic Interventions/Progress Updates:    Session 1: Upon entering the room, pt supine in bed with Dawn (wife), Amil AmenJulia (daughter), and Ginger ( cousin) arrived for hands on family education. OT educated caregivers on pt's current progress and goal level. OT made several modification for home safety such as using lock box for keys since pt is not able to drive at this time. OT recommended several activities and even a family game night to address pt's deficits but to include family in activity as well. Caregiver's asking several questions to clarify safety measures within the home. Pt ambulating with supervision from his wife while he obtained clothing items needed to shower. Pt ambulating into bathroom without use of AD and performed toilet needs and well as shower transfer with overall supervision and min cues given from Wife for safety awareness. Pt dressing and then returning to sit on EOB with overall supervision. Caregiver feels better about providing supervision for safety. Pt seated on EOB and transitions easily to SLP session.   Session 2: Upon entering the room, pt supine in bed and refusing to exit bed. Pt reports family education went well and he is excited about going home on Saturday. OT asked pt to list several games he planned on playing with children. Pt able to list 8 games with mod verbal guidance cues. OT  encouraging pt to exit the bed but pt continued to decline. Pt verbalized, " It is gonna be different at home but we got it". Pt remained supine with bed alarm activated and call bell within reach upon exiting the room.   Therapy Documentation Precautions:  Precautions Precautions: Fall, Cervical Precaution Comments: monitor vitals Required Braces or Orthoses: (Cervical collar d/c 1/7) Cervical Brace: (DC hard collar) Restrictions Weight Bearing Restrictions: No General:   Vital Signs:   Pain: Pain Assessment Pain Assessment: 0-10 Pain Score: 4  Pain Type: Acute pain Pain Location: Head Pain Orientation: Posterior Pain Intervention(s): Medication (See eMAR) ADL:   Vision   Perception    Praxis   Exercises:   Other Treatments:    See Function Navigator for Current Functional Status.   Therapy/Group: Individual Therapy  Alen BleacherBradsher, Jasmon Mattice P 06/11/2017, 11:04 AM

## 2017-06-11 NOTE — Progress Notes (Signed)
Physical Therapy Session Note  Patient Details  Name: Anthony Singleton MRN: [id number] Date of Birth: [birth year]  Today's Date: 06/11/2017 PT Individual Time: 1105-1200 and [id number] PT Individual Time Calculation (min): 55 min and 53 min   Short Term Goals: Week 2:  PT Short Term Goal 1 (Week 2): Pt will negotiate 4 steps with R rail only with min assist.  PT Short Term Goal 2 (Week 2): Pt will ambulate 50 ft with supervision assist with LRAD.  Skilled Therapeutic Interventions/Progress Updates:  Treatment 1: Pt received in room with wife, daughter, & family member present for caregiver training. Pt ambulated throughout unit, negotiated stairs, completed furniture transfers, toileting, and car transfers (SUV height) with supervision overall. Therapist provided instruction and demonstration with family members return demonstrating how to properly assist pt with all tasks. Pt continues to demonstrate impulsivity and decreased attention, especially in a distracted environment. Discussed home modifications and safety in community & home environments. Discussed f/u therapy, DME, and what to do if pt falls at home. Pt's caregivers voiced understanding of all education and all questions were answered to their satisfaction. At end of session pt left in bed with family present to supervise.    Treatment 2: Pt received in room & agreeable to tx, denying c/o pain at rest but noting HA at end of session (declined requesting pain meds). Session focused on cognitive remediation through functional tasks, BLE strengthening, and NMR.  Pt ambulates throughout unit without AD & supervision. Pt prepared package of cookies with close supervision and mod cuing for task. Pt has to reference back to recipe & instructions to prepare cookies. Pt utilized cybex kinetron in standing with UE support for BLE strengthening, progressing to no UE support to focus on weight shifting and dynamic balance (pt required  steady<>min assist for this). At end of session pt left in bed with alarm set, all needs within reach, and visitor present.    Therapy Documentation Precautions:  Precautions Precautions: Fall, Cervical Precaution Comments: monitor vitals Required Braces or Orthoses: (Cervical collar d/c 1/7) Cervical Brace: (DC hard collar) Restrictions Weight Bearing Restrictions: No   See Function Navigator for Current Functional Status.   Therapy/Group: Individual Therapy  Sandi MariscalVictoria M Vicktoria Muckey 06/11/2017, 4:38 PM

## 2017-06-11 NOTE — Progress Notes (Signed)
Speech Language Pathology Daily Session Note  Patient Details  Name: Anthony Singleton MRN: [id number] Date of Birth: Oct 23, 1970  Today's Date: 06/11/2017 SLP Individual Time: 1000-1055 SLP Individual Time Calculation (min): 55 min  Short Term Goals: Week 2: SLP Short Term Goal 1 (Week 2): Patient will consume current diet with minimal overt s/s of aspiration with Min A verbal cues for use of swallowing compensatory strategies.  SLP Short Term Goal 2 (Week 2): Patient will demonstrate efficient mastication without overt s/s of aspiration with regular textures over 2 sessions prior to upgrade.  SLP Short Term Goal 3 (Week 2): Patient will demonstrate functional problem solving for basic and familiar tasks with Max A verbal cues.  SLP Short Term Goal 4 (Week 2): Patient will verbalize 1 cognitive and 1 physical deficit with Mod A verbal cues.  SLP Short Term Goal 5 (Week 2): Patient will demonstrate sustained attention to a functional task for ~10 minutes with Min A verbal cues for redirection.   Skilled Therapeutic Interventions: Skilled treatment session focused on completion of family education with the patient's wife, daughter and cousin. SLP facilitated session by providing education in regards to the patient's current cognitive functioning and strategies to utilize at home to maximize independence, attention, recall and overall safety as well strategies to minimize overstimulation and frustration. All verbalized understanding of all information and all provided appropriate cueing to patient throughout session. Patient left upright in bed with family present. Continue with current plan of care.      Function:   Cognition Comprehension Comprehension assist level: Follows basic conversation/direction with no assist  Expression   Expression assist level: Expresses basic 90% of the time/requires cueing < 10% of the time.  Social Interaction Social Interaction assist level: Interacts  appropriately 50 - 74% of the time - May be physically or verbally inappropriate.  Problem Solving Problem solving assist level: Solves basic 75 - 89% of the time/requires cueing 10 - 24% of the time  Memory Memory assist level: Recognizes or recalls 75 - 89% of the time/requires cueing 10 - 24% of the time    Pain No/Denies Pain   Therapy/Group: Individual Therapy  Gaye Scorza 06/11/2017, 4:00 PM

## 2017-06-11 NOTE — Progress Notes (Signed)
Subjective/Complaints: Had a pretty good night.  Still having headaches in the evening.  No further headache this morning.  Sleep overall was fairly good  ROS: pt denies nausea, vomiting, diarrhea, cough, shortness of breath or chest pain   Objective: Vital Signs: Blood pressure 103/76, pulse 72, temperature 98.4 F (36.9 C), temperature source Oral, resp. rate 18, height 6\' 5"  (1.956 m), weight 85.5 kg (188 lb 9.6 oz), SpO2 99 %. No results found. No results found for this or any previous visit (from the past 72 hour(s)).   HEENT: Normocephalic. Atraumatic. Cardio: RRR      Resp: CTA Bilaterally without wheezes or rales. Normal effort  GI: BS positive andND Skin:   Intact. Warm and dry. Neuro: Patient is alert.  Cranial nerve exam is nonfocal.  Attention concentration is improving.  He remembers me and events from therapy yesterday. Musc/Skel: Minimal right shoulder pain this morning Gen NAD. Vital signs reviewed. Psych: Patient is very pleasant and jovial today.  Assessment/Plan: 1. Functional deficits secondary to TBI skull fracture which require 3+ hours per day of interdisciplinary therapy in a comprehensive inpatient rehab setting. Physiatrist is providing close team supervision and 24 hour management of active medical problems listed below. Physiatrist and rehab team continue to assess barriers to discharge/monitor patient progress toward functional and medical goals. FIM: Function - Bathing Position: Shower Body parts bathed by patient: Right arm, Left arm, Chest, Abdomen, Front perineal area, Buttocks, Right upper leg, Left upper leg, Right lower leg, Left lower leg Body parts bathed by helper: Back Bathing not applicable: Back Assist Level: Supervision or verbal cues  Function- Upper Body Dressing/Undressing What is the patient wearing?: Pull over shirt/dress Pull over shirt/dress - Perfomed by patient: Thread/unthread right sleeve, Thread/unthread left sleeve, Put  head through opening, Pull shirt over trunk Assist Level: Set up, Supervision or verbal cues Set up : To obtain clothing/put away Function - Lower Body Dressing/Undressing What is the patient wearing?: Underwear, Pants, Socks, Shoes Position: Sitting EOB Underwear - Performed by patient: Thread/unthread right underwear leg, Thread/unthread left underwear leg, Pull underwear up/down Pants- Performed by patient: Thread/unthread right pants leg, Thread/unthread left pants leg, Pull pants up/down Socks - Performed by patient: Don/doff right sock, Don/doff left sock Shoes - Performed by patient: Don/doff right shoe, Don/doff left shoe Shoes - Performed by helper: Don/doff right shoe, Don/doff left shoe, Fasten right, Fasten left Assist for footwear: Supervision/touching assist Assist for lower body dressing: Supervision or verbal cues  Function - Toileting Toileting steps completed by patient: Adjust clothing prior to toileting, Performs perineal hygiene, Adjust clothing after toileting Toileting steps completed by helper: Adjust clothing after toileting Toileting Assistive Devices: Grab bar or rail Assist level: Supervision or verbal cues  Function Programmer, multimedia transfer activity did not occur: Safety/medical concerns Toilet transfer assistive device: Grab bar Assist level to toilet: Supervision or verbal cues Assist level from toilet: Supervision or verbal cues  Function - Chair/bed transfer Chair/bed transfer method: Ambulatory Chair/bed transfer assist level: Supervision or verbal cues Chair/bed transfer assistive device: Armrests Chair/bed transfer details: Verbal cues for precautions/safety  Function - Locomotion: Wheelchair Will patient use wheelchair at discharge?: No Type: Manual Assist Level: Dependent (Pt equals 0%) Assist Level: Dependent (Pt equals 0%) Assist Level: Dependent (Pt equals 0%) Function - Locomotion: Ambulation Ambulation activity did not occur:  Safety/medical concerns Assistive device: No device Max distance: >150 ft Assist level: Supervision or verbal cues Assist level: Supervision or verbal cues Assist level: Supervision or  verbal cues Walk 150 feet activity did not occur: Safety/medical concerns Assist level: Supervision or verbal cues Walk 10 feet on uneven surfaces activity did not occur: Safety/medical concerns Assist level: Supervision or verbal cues  Function - Comprehension Comprehension: Auditory Comprehension assist level: Follows basic conversation/direction with no assist  Function - Expression Expression: Verbal Expression assist level: Expresses basic 90% of the time/requires cueing < 10% of the time.  Function - Social Interaction Social Interaction assist level: Interacts appropriately 50 - 74% of the time - May be physically or verbally inappropriate.  Function - Problem Solving Problem solving assist level: Solves basic 75 - 89% of the time/requires cueing 10 - 24% of the time  Function - Memory Memory assist level: Recognizes or recalls 75 - 89% of the time/requires cueing 10 - 24% of the time Patient normally able to recall (first 3 days only): Staff names and faces, That he or she is in a hospital, Current season  Medical Problem List and Plan: 1.  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  DVT Prophylaxis/Anticoagulation: Mechanical: Sequential compression devices, below knee Bilateral lower extremities 3. Headaches/ Pain Management: tylenol   -Headaches are improving.  We will add low-dose Topamax at night      -X-ray right shoulder  unremarkable for fracture continue muscle rub for muscular component of pain   -kpad     4. Mood: Monitor as mentation improves. LCSW to follow along for evaluation and support when appropriate.  5. Neuropsych: This patient is not capable of making  decisions on his own behalf.   - continue ritalin to improve attention day/night cycle     -Discontinue daytime Seroquel, wean bedtime Seroquel as well   -Continues to require enclosure bed due to safety issues.  Would like to liberate from bed soon 6. Skin/Wound Care: routine pressure relief measures.  7. Fluids/Electrolytes/Nutrition: Encourage p.o. Intake   -BMP within normal limits on 1/11 8. Seizures: Continue Keppra bid.  9. Dysphagia: Encourage appropriate swallowing strategies..    Advanced regular diet with full supervision 10. HTN: Blood pressure is actually low right now  11. Tachycardia: Metoprolol down to 25 mg twice daily.   We will stop metoprolol today Vitals:   06/10/17 2100 06/11/17 0313  BP: 115/78 103/76  Pulse: 82 72  Resp:    Temp: 99.3 F (37.4 C) 98.4 F (36.9 C)  SpO2: 98% 99%       12. ABLA:   Hgb 12.1 on 1/11.   Added multivitamin with iron.  13.  Sleep: Improved                    14.  Hypoalbuminemia- good po intake 15. 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 Per reports--tinnitus was worsening as well as problems with equilibrium since thanksgiving-question Meniere's disease.    16.  C Spine CT negative  LOS (Days) 13 A FACE TO FACE EVALUATION WAS PERFORMED  Anthony Singleton T 06/11/2017, 9:10 AM

## 2017-06-12 ENCOUNTER — Inpatient Hospital Stay (HOSPITAL_COMMUNITY): Payer: BLUE CROSS/BLUE SHIELD | Admitting: Physical Therapy

## 2017-06-12 ENCOUNTER — Inpatient Hospital Stay (HOSPITAL_COMMUNITY): Payer: BLUE CROSS/BLUE SHIELD | Admitting: Occupational Therapy

## 2017-06-12 ENCOUNTER — Encounter (HOSPITAL_COMMUNITY): Payer: BLUE CROSS/BLUE SHIELD | Admitting: Speech Pathology

## 2017-06-12 LAB — BASIC METABOLIC PANEL
ANION GAP: 13 (ref 5–15)
BUN: 12 mg/dL (ref 6–20)
CHLORIDE: 107 mmol/L (ref 101–111)
CO2: 21 mmol/L — ABNORMAL LOW (ref 22–32)
Calcium: 9 mg/dL (ref 8.9–10.3)
Creatinine, Ser: 0.88 mg/dL (ref 0.61–1.24)
GFR calc Af Amer: >60 mL/min (ref >60)
Glucose, Bld: 85 mg/dL (ref 65–99)
POTASSIUM: 3.8 mmol/L (ref 3.5–5.1)
SODIUM: 141 mmol/L (ref 135–145)

## 2017-06-12 MED ORDER — SENNOSIDES-DOCUSATE SODIUM 8.6-50 MG PO TABS: 2.0000 | ORAL_TABLET | Freq: Every day | ORAL | Status: DC

## 2017-06-12 MED ORDER — TOPIRAMATE 25 MG PO TABS: 25.0000 mg | ORAL_TABLET | Freq: Every day | ORAL | 0 refills | Status: DC

## 2017-06-12 MED ORDER — CLONIDINE 0.1 MG/24HR TD PTWK: 0.1000 mg | MEDICATED_PATCH | TRANSDERMAL | 0 refills | Status: DC

## 2017-06-12 MED ORDER — QUETIAPINE FUMARATE 50 MG PO TABS: 50.0000 mg | ORAL_TABLET | Freq: Every day | ORAL | 0 refills | Status: DC

## 2017-06-12 MED ORDER — OXYCODONE HCL 5 MG PO TABS: 5.0000 mg | ORAL_TABLET | Freq: Two times a day (BID) | ORAL | 0 refills | Status: DC | PRN

## 2017-06-12 MED ORDER — QUETIAPINE FUMARATE 50 MG PO TABS
50.0000 mg | ORAL_TABLET | Freq: Every day | ORAL | Status: DC
  Administered 2017-06-12: 50 mg via ORAL
  Filled 2017-06-12: qty 1

## 2017-06-12 MED ORDER — METHYLPHENIDATE HCL 5 MG PO TABS: 5.0000 mg | ORAL_TABLET | Freq: Two times a day (BID) | ORAL | 0 refills | Status: DC

## 2017-06-12 MED ORDER — LEVETIRACETAM 500 MG PO TABS: 500.0000 mg | ORAL_TABLET | Freq: Two times a day (BID) | ORAL | 0 refills | Status: DC

## 2017-06-12 MED ORDER — MUSCLE RUB 10-15 % EX CREA: 1.0000 "application " | TOPICAL_CREAM | Freq: Four times a day (QID) | CUTANEOUS | 0 refills | Status: DC

## 2017-06-12 MED ORDER — ACETAMINOPHEN 325 MG PO TABS: 650.0000 mg | ORAL_TABLET | Freq: Three times a day (TID) | ORAL | Status: DC

## 2017-06-12 NOTE — Discharge Summary (Addendum)
Occupational Therapy Discharge Summary  Patient Details  Name: Anthony Singleton MRN: [id number] Date of Birth: 12-01-1970  Today's Date: 06/12/2017 OT Individual Time: 1761-6073 OT Individual Time Calculation (min): 57 min    Patient has met 13 of 13 long term goals due to improved activity tolerance, improved balance, postural control, ability to compensate for deficits, improved attention, improved awareness and improved coordination.  Patient to discharge at overall Supervision level.  Patient's care partner, spouse Anthony Singleton, is independent to provide the necessary cognitive assistance at discharge.    All goals met.   Recommendation:  Patient will benefit from ongoing skilled OT services in home health setting to continue to advance functional skills in the area of BADL, iADL and Vocation.  Equipment: TTB  Reasons for discharge: treatment goals met  Patient/family agrees with progress made and goals achieved: Yes   Skilled Therapeutic Intervention:  Pt greeted supine in bed, asleep, easily woken. Agreeable to shower. He ambulated around room with supervision to gather necessary items without LOB. Pt transferring into shower and completed bathing with supervision at sit<stand level, dressed while sitting on toilet and standing with grab bar support as needed. Pt locating and using grooming items appropriately with 1 cue to initiate. Pt stooping to floor to clean up soiled linen and place dirty clothing in laundry bag. Afterwards pt ambulated around unit, dual task processing challenges by maneuvering around environmental barriers in moderately stimulating environment while conversing with OT. Pt recalling Anthony few specific d/c recommendations made by primary therapists. During discussion, pt exhibiting increased safety awareness and insight into deficits than during sessions last week. He was able to pathfind his way back to the room with min vcs and transferred to couch. Anthony&Ox4. Pt left  with all needs within reach at session exit.   OT Discharge Precautions/Restrictions  Precautions Precautions: Fall Restrictions Weight Bearing Restrictions: No Pain Pain Assessment Pain Assessment: No/denies pain Pain Score: Asleep Pain Intervention(s): Rest Multiple Pain Sites: No ADL ADL ADL Comments: Please see functional navigator for ADL status Vision Baseline Vision/History: Wears glasses Wears Glasses: At all times Patient Visual Report: No change from baseline Perception  Perception: Within Functional Limits Praxis Praxis: Impaired Praxis Impairment Details: Initiation Cognition Overall Cognitive Status: Impaired/Different from baseline Arousal/Alertness: Awake/alert Orientation Level: Oriented X4 Attention: Divided Selective Attention: Impaired Divided Attention: Impaired Memory: Impaired Awareness: Impaired Problem Solving: Impaired Behaviors: Impulsive Safety/Judgment: Impaired Rancho [employer] Scales of Cognitive Functioning: Purposeful/appropriate Sensation Sensation Light Touch: Appears Intact(bilateral UEs) Coordination Gross Motor Movements are Fluid and Coordinated: Yes Fine Motor Movements are Fluid and Coordinated: Yes Coordination and Movement Description: WFL for ADL tasks assessed Motor  Motor Motor - Discharge Observations: decreased endurance, generalized weakness Mobility  Transfers Sit to Stand: 5: Supervision;From toilet Stand to Sit: To toilet;5: Supervision  Trunk/Postural Assessment  Cervical Assessment Cervical Assessment: Within Functional Limits Thoracic Assessment Thoracic Assessment: Within Functional Limits Lumbar Assessment Lumbar Assessment: Within Functional Limits  Balance Balance Balance Assessed: Yes Dynamic Sitting Balance Dynamic Sitting - Balance Support: Feet supported;No upper extremity supported;During functional activity Dynamic Sitting - Level of Assistance: 6: Modified independent (Device/Increase  time) Sitting balance - Comments: Bathing on TTB Dynamic Standing Balance Dynamic Standing - Balance Support: No upper extremity supported Dynamic Standing - Level of Assistance: 5: Stand by assistance(LB dressing) Extremity/Trunk Assessment RUE Assessment RUE Assessment: Within Functional Limits LUE Assessment LUE Assessment: Within Functional Limits   See Function Navigator for Current Functional Status.  Anthony Singleton Anthony Singleton 06/12/2017, 12:50 PM

## 2017-06-12 NOTE — Progress Notes (Addendum)
Subjective/Complaints: Up with therapy.  No new complaints today.  Headache Perhaps a bit better today  ROS: pt denies nausea, vomiting, diarrhea, cough, shortness of breath or chest pain   Objective: Vital Signs: Blood pressure 115/73, pulse 79, temperature 98.7 F (37.1 C), temperature source Oral, resp. rate 12, height 6\' 5"  (1.956 m), weight 82.4 kg (181 lb 10.5 oz), SpO2 100 %. No results found. No results found for this or any previous visit (from the past 72 hour(s)).   HEENT: Normocephalic. Atraumatic. Cardio: RRR     Resp: CTA Bilaterally without wheezes or rales. Normal effort  GI: BS positive andND Skin:   Intact. Warm and dry. Neuro: Patient is alert.  Cranial nerve exam is nonfocal.  Attention concentration is improving.  He remembers me and events from therapy yesterday. Musc/Skel: Minimal right shoulder pain this morning Gen NAD.   Psych: Pleasant and interactive.  Assessment/Plan: 1. Functional deficits secondary to TBI skull fracture which require 3+ hours per day of interdisciplinary therapy in a comprehensive inpatient rehab setting. Physiatrist is providing close team supervision and 24 hour management of active medical problems listed below. Physiatrist and rehab team continue to assess barriers to discharge/monitor patient progress toward functional and medical goals. FIM: Function - Bathing Position: Shower Body parts bathed by patient: Right arm, Left arm, Chest, Abdomen, Front perineal area, Buttocks, Right upper leg, Left upper leg, Right lower leg, Left lower leg Body parts bathed by helper: Back Bathing not applicable: Back Assist Level: Supervision or verbal cues  Function- Upper Body Dressing/Undressing What is the patient wearing?: Pull over shirt/dress Pull over shirt/dress - Perfomed by patient: Thread/unthread right sleeve, Thread/unthread left sleeve, Put head through opening, Pull shirt over trunk Assist Level: Supervision or verbal cues Set  up : To obtain clothing/put away Function - Lower Body Dressing/Undressing What is the patient wearing?: Underwear, Pants, Socks, Shoes Position: Sitting EOB Underwear - Performed by patient: Thread/unthread right underwear leg, Thread/unthread left underwear leg, Pull underwear up/down Pants- Performed by patient: Thread/unthread right pants leg, Thread/unthread left pants leg, Pull pants up/down Socks - Performed by patient: Don/doff right sock, Don/doff left sock Shoes - Performed by patient: Don/doff right shoe, Don/doff left shoe Shoes - Performed by helper: Don/doff right shoe, Don/doff left shoe, Fasten right, Fasten left Assist for footwear: Supervision/touching assist Assist for lower body dressing: Supervision or verbal cues  Function - Toileting Toileting steps completed by patient: Adjust clothing prior to toileting, Performs perineal hygiene, Adjust clothing after toileting Toileting steps completed by helper: Adjust clothing after toileting Toileting Assistive Devices: Grab bar or rail Assist level: Supervision or verbal cues  Function - Archivist transfer activity did not occur: Safety/medical concerns Toilet transfer assistive device: Grab bar Assist level to toilet: Supervision or verbal cues Assist level from toilet: Supervision or verbal cues  Function - Chair/bed transfer Chair/bed transfer method: Ambulatory Chair/bed transfer assist level: Supervision or verbal cues Chair/bed transfer assistive device: Armrests Chair/bed transfer details: Verbal cues for precautions/safety  Function - Locomotion: Wheelchair Will patient use wheelchair at discharge?: No Type: Manual Wheelchair activity did not occur: N/A Assist Level: Dependent (Pt equals 0%) Wheel 50 feet with 2 turns activity did not occur: N/A Assist Level: Dependent (Pt equals 0%) Wheel 150 feet activity did not occur: N/A Assist Level: Dependent (Pt equals 0%) Function - Locomotion:  Ambulation Ambulation activity did not occur: Safety/medical concerns Assistive device: No device Max distance: >150 ft  Assist level: Supervision or verbal cues Assist  level: Supervision or verbal cues Assist level: Supervision or verbal cues Walk 150 feet activity did not occur: Safety/medical concerns Assist level: Supervision or verbal cues Walk 10 feet on uneven surfaces activity did not occur: Safety/medical concerns Assist level: Supervision or verbal cues  Function - Comprehension Comprehension: Auditory Comprehension assist level: Follows basic conversation/direction with no assist  Function - Expression Expression: Verbal Expression assist level: Expresses basic 90% of the time/requires cueing < 10% of the time.  Function - Social Interaction Social Interaction assist level: Interacts appropriately 50 - 74% of the time - May be physically or verbally inappropriate.  Function - Problem Solving Problem solving assist level: Solves basic 75 - 89% of the time/requires cueing 10 - 24% of the time  Function - Memory Memory assist level: Recognizes or recalls 75 - 89% of the time/requires cueing 10 - 24% of the time Patient normally able to recall (first 3 days only): Staff names and faces, That he or she is in a hospital, Current season  Medical Problem List and Plan: 1.  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  2.  DVT Prophylaxis/Anticoagulation: Mechanical: Sequential compression devices, below knee Bilateral lower extremities 3. Headaches/ Pain Management: tylenol   -Continue low-dose nighttime Topamax      -X-ray right shoulder  unremarkable for fracture continue muscle rub for muscular component of pain   -kpad     4. Mood: Monitor as mentation improves. LCSW to follow  along for evaluation and support when appropriate.  5. Neuropsych: This patient is not capable of making decisions on his own behalf.   - continue ritalin to improve attention day/night cycle     -Discontinue daytime Seroquel, weaning bedtime Seroquel as well   -in regular bed now.  6. Skin/Wound Care: routine pressure relief measures.  7. Fluids/Electrolytes/Nutrition: Encourage p.o. Intake   -BMP within normal limits on 1/11 8. Seizures: Continue Keppra bid.  9. Dysphagia: Encourage appropriate swallowing strategies..    Advanced regular diet with full supervision 10. HTN: Blood pressure is actually low right now  11. Tachycardia: Metoprolol stopped without any issue Vitals:   06/11/17 1352 06/12/17 0345  BP: 104/61 115/73  Pulse: 87 79  Resp: 20 12  Temp: 98.8 F (37.1 C) 98.7 F (37.1 C)  SpO2: 100% 100%       12. ABLA:   Hgb 12.1 on 1/11.      13.  Sleep: Improved                    14.  Hypoalbuminemia- good po intake 15. 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 Per reports--tinnitus was worsening as well as problems with equilibrium since thanksgiving-question Meniere's disease.    16.  C Spine CT negative  LOS (Days) 14 A FACE TO FACE EVALUATION WAS PERFORMED  Zhana Jeangilles T 06/12/2017, 9:33 AM

## 2017-06-12 NOTE — Plan of Care (Signed)
13/13 LTGs achieved 06/12/17

## 2017-06-12 NOTE — Progress Notes (Signed)
Physical Therapy Discharge Summary  Patient Details  Name: Anthony Singleton MRN: [id number] Date of Birth: September 07, 1970  Today's Date: 06/12/2017 PT Individual Time: 0805-0900 and 2229-7989 PT Individual Time Calculation (min): 55 min and 24 min    Patient has met 12 of 12 long term goals due to improved activity tolerance, improved balance, improved postural control, increased strength, ability to compensate for deficits, improved attention, improved awareness and improved coordination.  Patient to discharge at an ambulatory level Supervision without AD.   Patient's care partner is independent to provide the necessary physical and cognitive assistance at discharge. ([name] and other family members have been present for caregiver training and report comfort with assisting pt.)  Reasons goals not met: n/a  Recommendation:  Patient will benefit from ongoing skilled PT services in outpatient setting to continue to advance safe functional mobility, address ongoing impairments in decreased safety awareness, impaired memory & recall, decreased balance, decreased strength & endurance, decreased attention & awareness, and minimize fall risk.  Equipment: No equipment provided  Reasons for discharge: treatment goals met  Patient/family agrees with progress made and goals achieved: Yes   Skilled PT Treatment: Treatment 1: Pt received in bed & agreeable to tx. [name] & family member present for caregiver education. Pt noted HA but reports being premedicated. Pt ambulated throughout room & unit without AD & supervision. Pt with continent void in bathroom then donned pants, shirt, socks, and tennis shoes without assistance. Pt completed all grad day activities with supervision overall. Pt completed Berg Balance Test & scored [phone]; educated pt & family on interpretation of score & pt's current fall risk. Patient demonstrates increased fall risk as noted by score of 41/56 on Berg Balance Scale.   (<36= high risk for falls, close to 100%; 37-45 significant >80%; 46-51 moderate >50%; 52-55 lower >25%). At end of session pt returned to room & left sitting on EOB with bed alarm set, all needs within reach, and family present to supervise. [name] voices no further questions regarding d/c home tomorrow.   Treatment 2: Pt received in bed & agreeable to tx but reporting a "splitting HA" - RN made aware. Session focused on balance through high level gait training. Pt ambulated throughout unit without AD & supervision. Pt performed backwards walking & braiding without UE support and steady assist. Pt reported need to use restroom and returned to room where he performed toileting with distant supervision. Pt returned to bed declining further participation in session 2/2 severe HA. Pt left in bed with alarm set & all needs within reach.    PT Discharge Precautions/Restrictions Precautions Precautions: Fall Restrictions Weight Bearing Restrictions: No  Vision/Perception  Pt wears glasses at all times. Pt denies changes in baseline vision.  Cognition Overall Cognitive Status: Impaired/Different from baseline Orientation Level: Oriented to person;Oriented to time;Oriented to situation(year only) Attention: Sustained Sustained Attention: Impaired Memory: Impaired Memory Impairment: Decreased recall of new information;Decreased short term memory Awareness: Impaired Awareness Impairment: Anticipatory impairment Behaviors: Impulsive Safety/Judgment: Impaired  Sensation Sensation Light Touch: Appears Intact(BLE; denies any numbness or tingling) Coordination Gross Motor Movements are Fluid and Coordinated: Yes   Motor  Motor Motor - Discharge Observations: decreased endurance, generalized weakness   Mobility Bed Mobility Bed Mobility: Rolling Right;Rolling Left;Supine to Sit;Sit to Supine Rolling Right: 6: Modified independent (Device/Increase time) Rolling Left: 6: Modified  independent (Device/Increase time) Supine to Sit: 6: Modified independent (Device/Increase time) Sit to Supine: 6: Modified independent (Device/Increase time) Transfers Transfers: Yes Sit to  Stand: 5: Supervision   Locomotion  Ambulation Ambulation: Yes Ambulation/Gait Assistance: 5: Supervision Ambulation Distance (Feet): (>150 ft ) Assistive device: None Gait Gait: Yes Gait Pattern: (lateral sway, impaired balance) Stairs / Additional Locomotion Stairs: Yes Stairs Assistance: 5: Supervision Stair Management Technique: Two rails Number of Stairs: 12 Height of Stairs: (6" + 3") Curb: 5: Supervision Wheelchair Mobility Wheelchair Mobility: No   Trunk/Postural Assessment  Cervical Assessment Cervical Assessment: Within Functional Limits Thoracic Assessment Thoracic Assessment: Within Functional Limits Lumbar Assessment Lumbar Assessment: Within Functional Limits   Balance Balance Balance Assessed: Yes Standardized Balance Assessment Standardized Balance Assessment: Berg Balance Test Berg Balance Test Sit to Stand: Able to stand without using hands and stabilize independently Standing Unsupported: Able to stand 2 minutes with supervision Sitting with Back Unsupported but Feet Supported on Floor or Stool: Able to sit safely and securely 2 minutes Stand to Sit: Sits safely with minimal use of hands Transfers: Able to transfer with verbal cueing and /or supervision Standing Unsupported with Eyes Closed: Able to stand 10 seconds with supervision Standing Ubsupported with Feet Together: Able to place feet together independently and stand for 1 minute with supervision From Standing, Reach Forward with Outstretched Arm: Can reach confidently >25 cm (10") From Standing Position, Pick up Object from Floor: Able to pick up shoe, needs supervision From Standing Position, Turn to Look Behind Over each Shoulder: Looks behind from both sides and weight shifts well Turn 360 Degrees:  Needs close supervision or verbal cueing Standing Unsupported, Alternately Place Feet on Step/Stool: Able to complete 4 steps without aid or supervision(completes 8 steps but with supervision) Standing Unsupported, One Foot in Front: Able to plae foot ahead of the other independently and hold 30 seconds Standing on One Leg: Tries to lift leg/unable to hold 3 seconds but remains standing independently Total Score: 41   Extremity Assessment  RLE Assessment RLE Assessment: Within Functional Limits LLE Assessment LLE Assessment: Within Functional Limits   See Function Navigator for Current Functional Status.  Waunita Schooner 06/12/2017, 3:58 PM

## 2017-06-12 NOTE — Discharge Instructions (Signed)
Inpatient Rehab Discharge Instructions  Shelda Alteserry Dwayne XXXWilliams Discharge date and time: 06/13/16   Activities/Precautions/ Functional Status: Activity: no lifting, driving, or strenuous exercise for till cleared by MD Diet: regular diet Wound Care: none needed   Functional status:  ___ No restrictions     ___ Walk up steps independently _X__ 24/7 supervision/assistance   ___ Walk up steps with assistance ___ Intermittent supervision/assistance  ___ Bathe/dress independently ___ Walk with walker     ___ Bathe/dress with assistance ___ Walk Independently    ___ Shower independently ___ Walk with assistance    _X__ Shower with supervision _X__ No alcohol     ___ Return to work/school ________   Special Instructions:  FOR SAFETY, 24 HOUR SUPERVISION IS RECOMMENDED UNTIL OTHERWISE DIRECTED BY A PHYSICIAN.  ABSOLUTELY NO DRIVING UNTIL OTHERWISE DIRECTED BY A PHYSICIAN. CAR KEYS SHOULD BE HIDDEN IN A SAFE LOCATION IF NEEDED.  DUE TO RISK OF INJURY, PLEASE REMOVE GUNS, KNIVES, OR ANY OTHER POTENTIALLY DANGEROUS OBJECTS AND HAZARDOUS MATERIALS FROM THE HOME.    COMMUNITY REFERRALS UPON DISCHARGE:   Outpatient: PT     OT     ST  Agency:  [facility]                             [address]                            [place], [place]  [id number]             Phone:  [phone]  Appointment Date/Time:  Wednesday, June 17, 2017  Arrive at 1:15 - PT 1:30 PM; ST 2:30 PM; OT 3:30 PM Medical Equipment/Items Ordered:  None needed   GENERAL COMMUNITY RESOURCES FOR PATIENT/FAMILY: Support Groups:  [place] [facility]                              Meets the second Tuesday of the month, 7-8:30 PM                              In the dayroom of [facility], [address]                              For more information, call Amada JupiterLucy Hoyle at [phone]   My questions have been answered and I understand these instructions. I  will adhere to these goals and the provided educational materials after my discharge from the hospital.  Patient/Caregiver Signature _______________________________ Date __________  Clinician Signature _______________________________________ Date __________  Please bring this form and your medication list with you to all your follow-up doctor's appointments.

## 2017-06-12 NOTE — Progress Notes (Signed)
Speech Language Pathology Discharge Summary  Patient Details  Name: Anthony Singleton MRN: [id number] Date of Birth: 1971-04-07  Today's Date: 06/12/2017 SLP Individual Time: 9924-2683 SLP Individual Time Calculation (min): 50 min   Skilled Therapeutic Interventions:  Pt was seen for skilled ST targeting cognitive goals, grad day activities, and completion of family education.  SLP administered the Mo[employer] standardized cognitive assessment to measure progress from initial attempt.  Pt scored 22/30 (n >/= 26) with deficits most notable for storage and retrieval of information and abstract reasoning.  [name] was present throughout therapy session and could recall recommendations from yesterday's [place] education.  SLP reiterated techniques to manage pt's behavior and facilitate improved cognitive function in order to maximize pt's functional independence while also maintaining safety.  Pt needed min verbal cues for redirection during conversations regarding transition home due to decreased topic maintenance in the setting of selective attention impairment.  All questions were answered to pt's and wife's satisfaction at this time.  Pt was returned to room and left in bed with wife at bedside.  Pt is ready for discharge tomorrow.      Patient has met 8 of 8 long term goals.  Patient to discharge at [facility] level.  Reasons goals not met:     Clinical Impression/Discharge Summary:  Pt has made functional gains and is discharging having met x out of x short term goals.  Pt is currently min assist for mildly complex cognitive tasks and has demonstrated improved orientation, attention to tasks, awareness of deficits s/p TBI, functional problem solving, and safety awareness.  His diet has been advanced to regular textures and thin liquids which pt is consuming with supervision cues for use of swallowing precautions.  Pt and family education is complete at this time.  Pt is discharging home with 24/7  supervision from family.  Recommend ongoing ST follow up at next level of care to continue to address cognitive function.    Care Partner:  Caregiver Able to Provide Assistance: Yes  Type of Caregiver Assistance: Physical;Cognitive  fRecommendation:  24 hour supervision/assistance;Home Health SLP;Outpatient SLP  Rationale for SLP Follow Up: Maximize cognitive function and independence;Reduce caregiver burden;Maximize swallowing safety   Equipment: none recommended by SLP    Reasons for discharge: Discharged from hospital   Patient/Family Agrees with Progress Made and Goals Achieved: Yes   Function:  Eating Eating                 Cognition Comprehension Comprehension assist level: Follows basic conversation/direction with no assist  Expression   Expression assist level: Expresses basic needs/ideas: With no assist  Social Interaction Social Interaction assist level: Interacts appropriately 75 - 89% of the time - Needs redirection for appropriate language or to initiate interaction.  Problem Solving Problem solving assist level: Solves basic 75 - 89% of the time/requires cueing 10 - 24% of the time  Memory Memory assist level: Recognizes or recalls 75 - 89% of the time/requires cueing 10 - 24% of the time   Emilio Math 06/12/2017, 12:33 PM

## 2017-06-13 NOTE — Progress Notes (Signed)
Subjective/Complaints: Still having headaches but feels that they are better.  Excited to be going home today.  Very thankful for the work of our team  ROS: pt denies nausea, vomiting, diarrhea, cough, shortness of breath or chest pain   Objective: Vital Signs: Blood pressure 112/73, pulse 80, temperature 98.2 F (36.8 C), temperature source Oral, resp. rate 12, height '6\' 5"'$  (1.956 m), weight 82.4 kg (181 lb 10.5 oz), SpO2 100 %. No results found. Results for orders placed or performed during the hospital encounter of 05/29/17 (from the past 72 hour(s))  Basic metabolic panel     Status: Abnormal   Collection Time: 06/12/17  8:59 AM  Result Value Ref Range   Sodium 141 135 - 145 mmol/L   Potassium 3.8 3.5 - 5.1 mmol/L   Chloride 107 101 - 111 mmol/L   CO2 21 (L) 22 - 32 mmol/L   Glucose, Bld 85 65 - 99 mg/dL   BUN 12 6 - 20 mg/dL   Creatinine, Ser 0.88 0.61 - 1.24 mg/dL   Calcium 9.0 8.9 - 10.3 mg/dL   GFR calc non Af Amer >60 >60 mL/min   GFR calc Af Amer >60 >60 mL/min    Comment: (NOTE) The eGFR has been calculated using the CKD EPI equation. This calculation has not been validated in all clinical situations. eGFR's persistently <60 mL/min signify possible Chronic Kidney Disease.    Anion gap 13 5 - 15     HEENT: Normocephalic. Atraumatic. Cardio:  RRR     Resp: normal effort GI: BS positive andND Skin:   Intact. Warm and dry. Neuro: Patient is alert.  Cranial nerve exam is nonfocal.  Attention concentration is improving.  He remembers me and events from therapy yesterday. Musc/Skel: full ROM Gen NAD.   Psych: Pleasant and interactive.  Assessment/Plan: 1. Functional deficits secondary to TBI skull fracture which require 3+ hours per day of interdisciplinary therapy in a comprehensive inpatient rehab setting. Physiatrist is providing close team supervision and 24 hour management of active medical problems listed below. Physiatrist and rehab team continue to assess  barriers to discharge/monitor patient progress toward functional and medical goals. FIM: Function - Bathing Position: Shower Body parts bathed by patient: Right arm, Left arm, Chest, Abdomen, Front perineal area, Buttocks, Right upper leg, Left upper leg, Right lower leg, Left lower leg Body parts bathed by helper: Back Bathing not applicable: Back Assist Level: Supervision or verbal cues  Function- Upper Body Dressing/Undressing What is the patient wearing?: Pull over shirt/dress Pull over shirt/dress - Perfomed by patient: Thread/unthread right sleeve, Thread/unthread left sleeve, Put head through opening, Pull shirt over trunk Assist Level: Supervision or verbal cues Set up : To obtain clothing/put away Function - Lower Body Dressing/Undressing What is the patient wearing?: Underwear, Pants, Socks, Shoes Position: Sitting EOB Underwear - Performed by patient: Thread/unthread right underwear leg, Thread/unthread left underwear leg, Pull underwear up/down Pants- Performed by patient: Thread/unthread right pants leg, Thread/unthread left pants leg, Pull pants up/down Socks - Performed by patient: Don/doff right sock, Don/doff left sock Shoes - Performed by patient: Don/doff right shoe, Don/doff left shoe Shoes - Performed by helper: Don/doff right shoe, Don/doff left shoe, Fasten right, Fasten left Assist for footwear: Supervision/touching assist Assist for lower body dressing: Supervision or verbal cues  Function - Toileting Toileting steps completed by patient: Adjust clothing prior to toileting, Performs perineal hygiene, Adjust clothing after toileting(per most recent OT documentation) Toileting steps completed by helper: Adjust clothing after toileting Toileting  Assistive Devices: Grab bar or rail Assist level: Supervision or verbal cues  Function - Air cabin crew transfer activity did not occur: Safety/medical concerns Toilet transfer assistive device: Grab bar(per  staff report) Assist level to toilet: Supervision or verbal cues Assist level from toilet: Supervision or verbal cues  Function - Chair/bed transfer Chair/bed transfer method: Ambulatory Chair/bed transfer assist level: Supervision or verbal cues Chair/bed transfer assistive device: Armrests Chair/bed transfer details: Verbal cues for precautions/safety  Function - Locomotion: Wheelchair Will patient use wheelchair at discharge?: No Type: Manual Wheelchair activity did not occur: N/A Assist Level: Dependent (Pt equals 0%) Wheel 50 feet with 2 turns activity did not occur: N/A Assist Level: Dependent (Pt equals 0%) Wheel 150 feet activity did not occur: N/A Assist Level: Dependent (Pt equals 0%) Function - Locomotion: Ambulation Ambulation activity did not occur: Safety/medical concerns Assistive device: No device Max distance: >150 ft  Assist level: Supervision or verbal cues Assist level: Supervision or verbal cues Assist level: Supervision or verbal cues Walk 150 feet activity did not occur: Safety/medical concerns Assist level: Supervision or verbal cues Walk 10 feet on uneven surfaces activity did not occur: Safety/medical concerns Assist level: Supervision or verbal cues  Function - Comprehension Comprehension: Auditory Comprehension assist level: Follows basic conversation/direction with no assist  Function - Expression Expression: Verbal Expression assist level: Expresses basic needs/ideas: With no assist  Function - Social Interaction Social Interaction assist level: Interacts appropriately 75 - 89% of the time - Needs redirection for appropriate language or to initiate interaction.  Function - Problem Solving Problem solving assist level: Solves basic 75 - 89% of the time/requires cueing 10 - 24% of the time  Function - Memory Memory assist level: Recognizes or recalls 75 - 89% of the time/requires cueing 10 - 24% of the time Patient normally able to recall  (first 3 days only): Staff names and faces, That he or she is in a hospital, Current season  Medical Problem List and Plan: 1.  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  Goals met.  Reviewed discharge considerations with both patient and wife   -Patient to see Rehab MD in the office for transitional care encounter in 1-2 weeks.  2.  DVT Prophylaxis/Anticoagulation: Mechanical: Sequential compression devices, below knee Bilateral lower extremities 3. Headaches/ Pain Management: tylenol   -Continue low-dose nighttime Topamax once home   -Right shoulder pain largely improved     4. Mood: Monitor as mentation improves. LCSW to follow along for evaluation and support when appropriate.  5. Neuropsych: This patient is not capable of making decisions on his own behalf.   - continue ritalin to improve attention --continue once home until follow-up with me    -Continue Seroquel 100 mg nightly for sleep l    .  6. Skin/Wound Care: routine pressure relief measures.  7. Fluids/Electrolytes/Nutrition: Encourage p.o. Intake   -BMP within normal limits on 1/11 8. Seizures: Continue Keppra bid.  9. Dysphagia: Encourage appropriate swallowing strategies..    Advanced regular diet with full supervision 10. HTN: Blood pressure is actually low right now  11. Tachycardia: Metoprolol stopped without any issue Vitals:   06/12/17 1443 06/13/17 0415  BP: 112/73 112/73  Pulse: 87 80  Resp: 20 12  Temp: 98.6 F (37 C) 98.2 F (36.8 C)  SpO2: 100%        12. ABLA:  Hgb 12.1 on 1/11.      13.  Sleep: Improved                    14.  Hypoalbuminemia- good po intake 15. 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 Per reports--tinnitus was worsening as well as problems with equilibrium since thanksgiving-question Meniere's disease.    16.  C Spine CT negative  LOS (Days) 15 A FACE TO FACE EVALUATION WAS  PERFORMED  SWARTZ,ZACHARY T 06/13/2017, 9:19 AM
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
## 2017-07-01 ENCOUNTER — Inpatient Hospital Stay: Payer: BLUE CROSS/BLUE SHIELD | Admitting: Physical Medicine & Rehabilitation

## 2017-07-01 ENCOUNTER — Encounter
Payer: BLUE CROSS/BLUE SHIELD | Attending: Physical Medicine & Rehabilitation | Admitting: Physical Medicine & Rehabilitation

## 2017-07-01 ENCOUNTER — Encounter: Payer: Self-pay | Admitting: Physical Medicine & Rehabilitation

## 2017-07-01 VITALS — BP 104/73 | HR 109

## 2017-07-01 DIAGNOSIS — S069XAS Unspecified intracranial injury with loss of consciousness status unknown, sequela: Secondary | ICD-10-CM | POA: Insufficient documentation

## 2017-07-01 DIAGNOSIS — R4689 Other symptoms and signs involving appearance and behavior: Secondary | ICD-10-CM | POA: Diagnosis not present

## 2017-07-01 DIAGNOSIS — I10 Essential (primary) hypertension: Secondary | ICD-10-CM | POA: Diagnosis not present

## 2017-07-01 DIAGNOSIS — Z87820 Personal history of traumatic brain injury: Secondary | ICD-10-CM | POA: Insufficient documentation

## 2017-07-01 DIAGNOSIS — H9192 Unspecified hearing loss, left ear: Secondary | ICD-10-CM | POA: Insufficient documentation

## 2017-07-01 DIAGNOSIS — R51 Headache: Secondary | ICD-10-CM | POA: Insufficient documentation

## 2017-07-01 DIAGNOSIS — S069X0S Unspecified intracranial injury without loss of consciousness, sequela: Secondary | ICD-10-CM

## 2017-07-01 DIAGNOSIS — F419 Anxiety disorder, unspecified: Secondary | ICD-10-CM | POA: Insufficient documentation

## 2017-07-01 DIAGNOSIS — R42 Dizziness and giddiness: Secondary | ICD-10-CM | POA: Diagnosis not present

## 2017-07-01 DIAGNOSIS — R569 Unspecified convulsions: Secondary | ICD-10-CM | POA: Diagnosis not present

## 2017-07-01 DIAGNOSIS — G44329 Chronic post-traumatic headache, not intractable: Secondary | ICD-10-CM | POA: Insufficient documentation

## 2017-07-01 DIAGNOSIS — S062X9A Diffuse traumatic brain injury with loss of consciousness of unspecified duration, initial encounter: Secondary | ICD-10-CM

## 2017-07-01 MED ORDER — DIVALPROEX SODIUM 250 MG PO DR TAB: 250.0000 mg | DELAYED_RELEASE_TABLET | Freq: Two times a day (BID) | ORAL | 4 refills | Status: DC

## 2017-07-01 MED ORDER — QUETIAPINE FUMARATE 100 MG PO TABS: 100.0000 mg | ORAL_TABLET | Freq: Every day | ORAL | 3 refills | Status: DC

## 2017-07-01 NOTE — Patient Instructions (Signed)
PLEASE FEEL FREE TO CALL OUR OFFICE WITH ANY PROBLEMS OR QUESTIONS [phone])     KEEP UP WITH YOUR THERAPY  WAYS TO ORGANIZE YOURSELF, INFORMATION---ROUTINE WILL HELP YOU FROM FEELING OVERWHELMED.

## 2017-07-01 NOTE — Progress Notes (Signed)
Subjective:    Patient ID: Anthony Singleton, male    DOB: [birth year], 47 y.o.   MRN: [id number]  HPI   Anthony Singleton is here in follow up of his TBI. He has been home and is dealing with the emotional issues related to his injury. He feels depressed at times and is emotional others. Benign stimuli can trigger an emotional reaction, either good or bad.  He denies continued feelings of depression but does state he feels depressed when he thinks about not being able to work or unable to perform an activity that he once did easily.  His sleep is interupted at times. He is using melatonin along with seroquel and topamax. Sometimes his pain keeps him up or wakes him up.  Generally he falls asleep and is up every hour or 2 throughout the night until early in the morning when he may get 2-3 hours of good sleep together.  He finds it difficult to relax and fall back to sleep once he wakes up.Marland Kitchen  He takes ritalin in the morning but stopped it couple a days ago to see if it helped his sleep. He didn't see much change. He also didn't notice much difference with his cognition/attention while on the Ritalin either.     His headaches are still problematic. They are most severe in his right fronto-parietal area.  They can bother him in any point during the day  He's making nice gains with his cognition and memory.  Continues to work with speech therapy in this regard  Bowels and bladder are functioning well.   He is doing therapy at [facility] in [place]. He is only receiving PT and SLP at this time.  They are beginning some vestibular therapies with him.Anthony Singleton is anxious to get back to work but is worried that he may not be able to assume the responsibilities of his job if he is not physically better than what he is currently.  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  Pain Inventory Average Pain 4 Pain Right Now 3 My pain is constant, dull and aching  In  the last 24 hours, has pain interfered with the following? General activity 7 Relation with others 7 Enjoyment of life 4 What TIME of day is your pain at its worst? night Sleep (in general) Poor  Pain is worse with: walking, bending, sitting and some activites Pain improves with: heat/ice and medication Relief from Meds: 2  Mobility walk without assistance do you drive?  no  Function disabled: date disabled 2018  Neuro/Psych dizziness depression anxiety  Prior Studies Any changes since last visit?  no  Physicians involved in your care Any changes since last visit?  no   Family History  Problem Relation Age of Onset  . Stroke Mother   . Stroke Father    Social History   Socioeconomic History  . Marital status: Married    Spouse name: None  . Number of children: None  . Years of education: None  . Highest education level: None  Social Needs  . Financial resource strain: None  . Food insecurity - worry: None  . Food insecurity - inability: None  . Transportation needs - medical: None  . Transportation needs - non-medical: None  Occupational History  . None  Tobacco Use  . Smoking status: Never Smoker  . Smokeless tobacco: Never Used  Substance and Sexual Activity  . Alcohol  use: No    Frequency: Never  . Drug use: Yes    Types: Amphetamines, Marijuana    Comment: "crack" pipe found on person, + for amphetamines, THC  . Sexual activity: Yes  Other Topics Concern  . None  Social History Narrative  . None   Past Surgical History:  Procedure Laterality Date  . APPENDECTOMY     when he was youmg, per wife   Past Medical History:  Diagnosis Date  . Anxiety disorder    hight levels of stress/does not like medications  . Gilbert's syndrome   . 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 Tinnitus, left    BP 104/73 (BP Location: Left Arm, Patient Position: Sitting, Cuff Size: Normal)   Pulse (!) 109   SpO2 97%   Opioid Risk Score:   Fall Risk  Score:  `1  Depression screen PHQ 2/9  No flowsheet data found.   Review of Systems  Constitutional: Positive for chills and fever.  HENT: Negative.   Eyes: Negative.   Respiratory: Negative.   Cardiovascular: Negative.   Gastrointestinal: Negative.   Endocrine: Negative.   Genitourinary: Negative.   Musculoskeletal: Negative.   Skin: Negative.   Allergic/Immunologic: Negative.   Neurological: Negative.   Hematological: Negative.   Psychiatric/Behavioral: Negative.   All other systems reviewed and are negative.      Objective:   Physical Exam  HEENT: Normocephalic. Atraumatic. Cardio:   Regular rate    Resp:  Normal effort GI: BS positive andND Skin:   Intact. Warm and dry. Neuro: Patient is alert and oriented to date and place.  Remembers me from the hospital.  Attention sometimes can come and go but generally stays focused without moving from topic to topic.  Memory for 3 words after 7 or 8 minutes was 0 out of 3.  Seems to have reasonable insight and awareness overall however.  Patient's cranial nerve exam is notable for decreased hearing on the left.  I did not elicit any nystagmus or vestibular symptoms although his neck was a bit tight and prevented aggressive range of motion today.  Strength is generally 5 out of 5 in all 4 limbs.  Musc/Skel: full ROM and lower extremities.  Neck is somewhat limited to range of motion in rotation and lateral bending bilaterally. Gen NAD.   Psych: Patient is a bit impulsive but otherwise appropriate.  There are no signs of agitation or depression..        Assessment & Plan:  Medical Problem List and Plan: 1. Hx of traumatic brain injury -continue outpt therapies to include PT and SLP   -Although he still has issues with his attention, I think we can go ahead and stop the Ritalin and observe for now.   -Discussed the fact that his recovery will be a process.  There are no "shortcuts".  I think there is a possibility of  him returning to work but he has to remember that he is still early on in recovery at this time. 2.  Headaches:             -maintain topamax at current dose.  Consider dose increase based on tolerance of Depakote              -begin depakote 250 mg twice daily 4. Behavior/sleep             -increase  Seroquel  To 100 mg at bedtime    -May continue melatonin  at night for sleep              .-add depakote for mood/headache as above   -May benefit from neuropsychological counseling as well.  I do not feel that he is depressed at this point but will observe for this as well. 5. Seizures: Continue Keppra bid.  6. HTN: May stop clonidine                 Thirty minutes of face to face patient care time were spent during this visit. All questions were encouraged and answered. Follow up in a month.
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
## 2017-07-29 ENCOUNTER — Encounter: Payer: Self-pay | Admitting: Physical Medicine & Rehabilitation

## 2017-07-29 ENCOUNTER — Encounter
Payer: BLUE CROSS/BLUE SHIELD | Attending: Physical Medicine & Rehabilitation | Admitting: Physical Medicine & Rehabilitation

## 2017-07-29 ENCOUNTER — Ambulatory Visit
Admission: RE | Admit: 2017-07-29 | Discharge: 2017-07-29 | Disposition: A | Discharge status: Home / Self Care | Payer: BLUE CROSS/BLUE SHIELD | Source: Ambulatory Visit | Attending: Physical Medicine & Rehabilitation | Admitting: Physical Medicine & Rehabilitation

## 2017-07-29 ENCOUNTER — Other Ambulatory Visit: Payer: Self-pay | Admitting: Physical Medicine & Rehabilitation

## 2017-07-29 VITALS — BP 113/76 | HR 95

## 2017-07-29 DIAGNOSIS — I10 Essential (primary) hypertension: Secondary | ICD-10-CM | POA: Diagnosis not present

## 2017-07-29 DIAGNOSIS — R51 Headache: Secondary | ICD-10-CM | POA: Diagnosis not present

## 2017-07-29 DIAGNOSIS — S062X9A Diffuse traumatic brain injury with loss of consciousness of unspecified duration, initial encounter: Secondary | ICD-10-CM | POA: Diagnosis not present

## 2017-07-29 DIAGNOSIS — R42 Dizziness and giddiness: Secondary | ICD-10-CM | POA: Diagnosis not present

## 2017-07-29 DIAGNOSIS — M25531 Pain in right wrist: Secondary | ICD-10-CM | POA: Diagnosis not present

## 2017-07-29 DIAGNOSIS — Z87820 Personal history of traumatic brain injury: Secondary | ICD-10-CM | POA: Diagnosis not present

## 2017-07-29 DIAGNOSIS — R569 Unspecified convulsions: Secondary | ICD-10-CM | POA: Diagnosis not present

## 2017-07-29 DIAGNOSIS — S069X0S Unspecified intracranial injury without loss of consciousness, sequela: Secondary | ICD-10-CM

## 2017-07-29 DIAGNOSIS — F419 Anxiety disorder, unspecified: Secondary | ICD-10-CM | POA: Insufficient documentation

## 2017-07-29 DIAGNOSIS — R4689 Other symptoms and signs involving appearance and behavior: Secondary | ICD-10-CM | POA: Diagnosis not present

## 2017-07-29 DIAGNOSIS — M1712 Unilateral primary osteoarthritis, left knee: Secondary | ICD-10-CM

## 2017-07-29 DIAGNOSIS — H9192 Unspecified hearing loss, left ear: Secondary | ICD-10-CM | POA: Insufficient documentation

## 2017-07-29 MED ORDER — DICLOFENAC SODIUM 50 MG PO TBEC: 50.0000 mg | DELAYED_RELEASE_TABLET | Freq: Two times a day (BID) | ORAL | 3 refills | Status: DC

## 2017-07-29 NOTE — Patient Instructions (Signed)
PLEASE FEEL FREE TO CALL OUR OFFICE WITH ANY PROBLEMS OR QUESTIONS [phone])  Wear your wrist brace with activity

## 2017-07-29 NOTE — Progress Notes (Signed)
Subjective:    Patient ID: Anthony Singleton, male    DOB: 1971/01/23, 47 y.o.   MRN: [id number]  HPI   Anthony Singleton is here in follow-up of his traumatic brain injury.  At last visit we began a trial of Depakote and increase his Seroquel.  Between his improved sleep and the Depakote he has made nice progress.  His mood has been much improved.  He is sleeping through the night.  He has noticed that headaches are minimal at this point.  He did not have refills on his Topamax and Keppra.  His wife apparently called our office but did not receive a call back, so she did not have these filled.  About 3 weeks ago in therapy the nurse "pulled" on his right hand and he felt a pop and has had pain along his ulnar wrist. He has been wearing splint now to help protect the joint.  He also has noticed some pain in his left leg and decreased ROM in his left knee and numbness in his lateral 3 toes of his left foot.   Anthony Singleton still has intentions of returning to work at some point.  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  He has been getting out of the house more often.  His wife is taking him to work on occasion.  She feels that his activities with her on the job and out of the house of help to increase his stamina also.  Pain Inventory Average Pain 2 Pain Right Now 3 My pain is constant, burning and tingling  In the last 24 hours, has pain interfered with the following? General activity 1 Relation with others 1 Enjoyment of life 2 What TIME of day is your pain at its worst? evening Sleep (in general) Fair  Pain is worse with: bending and some activites Pain improves with: heat/ice and therapy/exercise Relief from Meds: .  Mobility walk without assistance ability to climb steps?  yes do you drive?  no  Function Do you have any goals in this area?  no  Neuro/Psych weakness numbness tingling  Prior Studies Any changes  since last visit?  no  Physicians involved in your care Any changes since last visit?  no   Family History  Problem Relation Age of Onset  . Stroke Mother   . Stroke Father    Social History   Socioeconomic History  . Marital status: Married    Spouse name: Not on file  . Number of children: Not on file  . Years of education: Not on file  . Highest education level: Not on file  Social Needs  . Financial resource strain: Not on file  . Food insecurity - worry: Not on file  . Food insecurity - inability: Not on file  . Transportation needs - medical: Not on file  . Transportation needs - non-medical: Not on file  Occupational History  . Not on file  Tobacco Use  . Smoking status: Never Smoker  . Smokeless tobacco: Never Used  Substance and Sexual Activity  . Alcohol use: No    Frequency: Never  . Drug use: Yes    Types: Amphetamines, Marijuana    Comment: "crack" pipe found on person, + for amphetamines, THC  . Sexual activity: Yes  Other Topics Concern  . Not on file  Social History Narrative  . Not on file   Past Surgical History:  Procedure Laterality Date  . APPENDECTOMY     when he was youmg, per wife   Past Medical History:  Diagnosis Date  . Anxiety disorder    hight levels of stress/does not like medications  . Gilbert's syndrome   . 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 Tinnitus, left    There were no vitals taken for this visit.  Opioid Risk Score:   Fall Risk Score:  `1  Depression screen PHQ 2/9  No flowsheet data found.   Review of Systems  Constitutional: Negative.   HENT: Negative.   Eyes: Negative.   Respiratory: Negative.   Cardiovascular: Negative.   Gastrointestinal: Negative.   Endocrine: Negative.   Genitourinary: Negative.   Musculoskeletal: Positive for myalgias.  Skin: Negative.   Allergic/Immunologic: Negative.   Neurological: Negative.   Hematological: Negative.   Psychiatric/Behavioral: Negative.   All  other systems reviewed and are negative.      Objective:   Physical Exam   HEENT: Normocephalic. Atraumatic. Cardio:  Regular rate Resp:  Normal effort GI: BS positive andND Skin: Intact. Warm and dry. Neuro:  Patient is alert and oriented x3.  He is more focused.  Recalls basic information.  Insight and awareness are improved.  He is a bit impulsive but generally appropriate.  Cranial nerve exam was unremarkable.  Strength is nearly 5 out of 5.  Sensory exam is grossly intact except perhaps his lateral left foot which I did not test today.  Musc/Skel:Patient has pain with palpation along the ulnar collateral ligament at the wrist.  Pain worst with inversion of the wrist as well as extension.  Mild bony tenderness upon the distal ulna as well.  Left knee notable for mild joint effusion.  He has medial greater than lateral joint line pain.  McMurray's test positive for medial joint line pain.  He had some pain with weightbearing and notable antalgia today on the left side.  Low back with minimal pain to palpation.  He did have some tenderness in the left PSIS area.  He is able to flex to about 45-50 degrees with some discomfort.  Posture was fair to good. Gen NAD.  Psych:  Remains a bit impulsive but improved.  He is overall very pleasant and polite        Assessment & Plan:  Medical Problem List and Plan: 1. Hx of traumatic brain injury -continue outpt therapies to include PT  -We discussed a potential return to work at some point.  Ironically some of the orthopedic issues below may be more rate limiting for him than anything else at this time.  -Gave him permission to be alone at home while his wife works.  2. z.  Headaches: much improved -continue depakote 250 mg twice daily 3. Right wrist pain. ?ligamentous injury   -check xrays   -continue wrist splint for now  -Oral diclofenac 50 mg twice daily 4. Behavior/sleep -continue   Seroquel at 100 mg at bedtime            -May continue melatonin at night for sleep -continue depakote for mood/headache as above            5. Seizures: Since he stopped the Keppra, the Depakote will serve as his form of seizure prophylaxis.  5. Left knee pain:  -likely OA and meniscal injry   -check xr  -begin diclofenac 6. HTN:  Resolved     30 minutes of direct patient care time  was spent today reviewing the multiple issues above.  I will see him back in about 1 month's time for follow-up of these issues.

## 2017-07-31 ENCOUNTER — Telehealth: Payer: Self-pay | Admitting: Physical Medicine & Rehabilitation

## 2017-07-31 NOTE — Telephone Encounter (Signed)
Called patient, no answer, left voicemail to return call for results  

## 2017-07-31 NOTE — Telephone Encounter (Signed)
Patient called back, xray results were given to him.

## 2017-07-31 NOTE — Telephone Encounter (Signed)
Please let Mr. Anthony Singleton know that his wrist and knee x-rays show no acute fractures or sign of significant osteoarthritis.  Would continue with the plan as I described in the office.  If he has further pain or problems he should contact us and let us know.  Thanks

## 2017-08-26 ENCOUNTER — Encounter: Payer: Self-pay | Admitting: Physical Medicine & Rehabilitation

## 2017-08-26 ENCOUNTER — Encounter
Payer: BLUE CROSS/BLUE SHIELD | Attending: Physical Medicine & Rehabilitation | Admitting: Physical Medicine & Rehabilitation

## 2017-08-26 VITALS — BP 109/77 | HR 95 | Ht 78.0 in | Wt 224.0 lb

## 2017-08-26 DIAGNOSIS — R569 Unspecified convulsions: Secondary | ICD-10-CM | POA: Diagnosis not present

## 2017-08-26 DIAGNOSIS — R4689 Other symptoms and signs involving appearance and behavior: Secondary | ICD-10-CM | POA: Diagnosis not present

## 2017-08-26 DIAGNOSIS — G478 Other sleep disorders: Secondary | ICD-10-CM

## 2017-08-26 DIAGNOSIS — M25562 Pain in left knee: Secondary | ICD-10-CM

## 2017-08-26 DIAGNOSIS — S069XAS Unspecified intracranial injury with loss of consciousness status unknown, sequela: Secondary | ICD-10-CM

## 2017-08-26 DIAGNOSIS — G44329 Chronic post-traumatic headache, not intractable: Secondary | ICD-10-CM

## 2017-08-26 DIAGNOSIS — F5101 Primary insomnia: Secondary | ICD-10-CM | POA: Diagnosis not present

## 2017-08-26 DIAGNOSIS — I10 Essential (primary) hypertension: Secondary | ICD-10-CM | POA: Insufficient documentation

## 2017-08-26 DIAGNOSIS — R42 Dizziness and giddiness: Secondary | ICD-10-CM | POA: Diagnosis not present

## 2017-08-26 DIAGNOSIS — H9192 Unspecified hearing loss, left ear: Secondary | ICD-10-CM | POA: Diagnosis not present

## 2017-08-26 DIAGNOSIS — F419 Anxiety disorder, unspecified: Secondary | ICD-10-CM | POA: Insufficient documentation

## 2017-08-26 DIAGNOSIS — S069X0S Unspecified intracranial injury without loss of consciousness, sequela: Secondary | ICD-10-CM | POA: Diagnosis not present

## 2017-08-26 DIAGNOSIS — Z87820 Personal history of traumatic brain injury: Secondary | ICD-10-CM | POA: Insufficient documentation

## 2017-08-26 DIAGNOSIS — R51 Headache: Secondary | ICD-10-CM | POA: Diagnosis not present

## 2017-08-26 DIAGNOSIS — G4700 Insomnia, unspecified: Secondary | ICD-10-CM | POA: Insufficient documentation

## 2017-08-26 MED ORDER — TRAZODONE HCL 50 MG PO TABS: 50.0000 mg | ORAL_TABLET | Freq: Every day | ORAL | 2 refills | Status: DC

## 2017-08-26 MED ORDER — QUETIAPINE FUMARATE 50 MG PO TABS: 50.0000 mg | ORAL_TABLET | Freq: Every day | ORAL | 4 refills | Status: DC

## 2017-08-26 NOTE — Patient Instructions (Addendum)
PLEASE FEEL FREE TO CALL OUR OFFICE WITH ANY PROBLEMS OR QUESTIONS [phone])   REMEMBER TO ICE YOUR LEFT KNEE AFTER YOU EXERCISE. DON'T OVER DO IT WITH WORK.

## 2017-08-26 NOTE — Progress Notes (Signed)
Subjective:    Patient ID: Anthony Singleton, male    DOB: 22-Sep-1970, 47 y.o.   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  Since I last saw him he states that things have been going fairly well at home.  He has spent time at home alone without any issues.  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  He is right wrist has improved him a pain standpoint although he still having some pain at the left knee.  It seems to be more near the patella now as opposed to the medial lateral portions of the knee.  We started him on diclofenac last month which seems to help somewhat.  I reviewed x-rays with him and his wife today in the office.  We had x-rayed his wrist and knee after last visit.  There were no acute findings noted on the films.  Sostenes has driven a short distance down their driveway without any problems.  He would like to get back to work and has been talking with his boss about potentially returning to work.  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  Pain Inventory Average Pain 2 Pain Right Now 3 My pain is intermittent, dull and aching  In the last 24 hours, has pain interfered with the following? General activity 1 Relation with others 1 Enjoyment of life 0 What TIME of day is your pain at its worst? evening Sleep (in general) Fair  Pain is worse with: bending Pain improves with: heat/ice and medication Relief from Meds: 5  Mobility walk without assistance ability to climb steps?  yes do you drive?  yes  Function employed # of hrs/week .  Neuro/Psych trouble walking dizziness  Prior Studies Any changes since last visit?  no  Physicians involved in your care Any changes since last visit?  no   Family History  Problem Relation Age of Onset  . Stroke Mother   . Stroke Father     Social History   Socioeconomic History  . Marital status: Married    Spouse name: Not on file  . Number of children: Not on file  . Years of education: Not on file  . Highest education level: Not on file  Occupational History  . Not on file  Social Needs  . Financial resource strain: Not on file  . Food insecurity:    Worry: Not on file    Inability: Not on file  . Transportation needs:    Medical: Not on file    Non-medical: Not on file  Tobacco Use  . Smoking status: Never Smoker  . Smokeless tobacco: Never Used  Substance and Sexual Activity  . Alcohol use: No    Frequency: Never  . Drug use: Yes    Types: Amphetamines, Marijuana    Comment: "crack" pipe found on person, + for amphetamines, THC  . Sexual activity: Yes  Lifestyle  . Physical activity:    Days per week: Not on file    Minutes per session: Not on file  . Stress: Not on file  Relationships  . Social connections:    Talks on phone: Not on file    Gets together: Not on file    Attends religious service: Not on file    Active member of  club or organization: Not on file    Attends meetings of clubs or organizations: Not on file    Relationship status: Not on file  Other Topics Concern  . Not on file  Social History Narrative  . Not on file   Past Surgical History:  Procedure Laterality Date  . APPENDECTOMY     when he was [employer], per wife   Past Medical History:  Diagnosis Date  . Anxiety disorder    hight levels of stress/does not like medications  . Gilbert's syndrome   . 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 Tinnitus, left    BP 109/77   Pulse 95   Ht 6\' 6"  (1.981 m)   Wt 224 lb (101.6 kg)   SpO2 95%   BMI 25.89 kg/m   Opioid Risk Score:   Fall Risk Score:  `1  Depression screen PHQ 2/9  No flowsheet data found.   Review of Systems  Constitutional: Positive for unexpected weight change.  HENT: Negative.   Eyes: Negative.   Respiratory: Negative.   Cardiovascular:  Negative.   Gastrointestinal: Negative.   Endocrine: Negative.   Genitourinary: Negative.   Musculoskeletal: Positive for arthralgias, gait problem, joint swelling and myalgias.  Skin: Negative.   Allergic/Immunologic: Negative.   Hematological: Negative.   Psychiatric/Behavioral: Negative.   All other systems reviewed and are negative.      Objective:   Physical Exam  General: No acute distress HEENT: EOMI, oral membranes moist Cards: reg rate  Chest: normal effort Abdomen: Soft, NT, ND Skin: dry, intact Extremities: no edema  . Neuro:  He is alert and oriented x3.  Has good attention and insight.  Strength 5 out of 5.  Balance is intact. Musc/Skel: Patient with minimal pain along the ulnar aspect of the wrist.  I palpated along his knee and there is  minimal tenderness with palpation of the lateral joint line.  There is no tenderness with palpation of the medial joint line. He had some mild crepitus with extension and flexion.  There was some tenderness around the patella.  No frank antalgia was seen with gait Psych:  Patient is pleasant and appropriate.  No impulsivity seen today      Assessment & Plan:  Medical Problem List and Plan: 1. Hx of traumatic brain injury -continue with HEP         -Gave him permission to return to work at [employer] duty.  I recommended 4-hour days to start   -He may practice driving with his wife and if things go without any incident he may return to driving at least to work and during the daytime. 2. Headaches: much improved -continue depakote250 mg twice daily at least for the next month or 2 3. Right wrist pain. ?ligamentous injury--improved          -xrays without acute findings          -continue wrist splint for now          -Oral diclofenac 50 mg twice daily 4.Behavior/sleep -Decrease Seroquel to 50 mg at bedtime -Start trazodone 50 mg at bedtime with idea of transitioning off  of Seroquel      I think getting back to work and on a schedule again should also improve his sleep-wake schedule -continue depakote for mood/headacheas above 5. Seizures: Depakote will serve as his form of seizure prophylaxis. 5. Left knee pain:          -Appears more  meniscus in etiology at last examination/meniscus, although today the pain is more in the peripatellar region.          -XR           -continue diclofenac  -knee strengthening exercises were provided.   -If he fails to improve then we can consider further imaging. 6. HTN: Resolved    25 minutes of direct patient care time was spent today reviewing the multiple issues above.  I will see him back in about 2 month's time for follow-up of these issues.  Greater than 50% of the time today was spent reviewing his imaging and discussing treatment options for his knee as well as discussing return to work.

## 2017-08-26 NOTE — Addendum Note (Signed)
Addended by: Faith RogueSWARTZ, Michaelpaul Apo T on: 08/26/2017 04:05 PM   Modules accepted: Level of Service

## 2017-10-26 ENCOUNTER — Encounter: Payer: Self-pay | Admitting: Physical Medicine & Rehabilitation

## 2017-10-26 ENCOUNTER — Encounter
Payer: BLUE CROSS/BLUE SHIELD | Attending: Physical Medicine & Rehabilitation | Admitting: Physical Medicine & Rehabilitation

## 2017-10-26 VITALS — BP 112/75 | HR 87 | Ht 77.0 in | Wt 230.0 lb

## 2017-10-26 DIAGNOSIS — R569 Unspecified convulsions: Secondary | ICD-10-CM | POA: Diagnosis not present

## 2017-10-26 DIAGNOSIS — G478 Other sleep disorders: Secondary | ICD-10-CM

## 2017-10-26 DIAGNOSIS — Z87820 Personal history of traumatic brain injury: Secondary | ICD-10-CM | POA: Insufficient documentation

## 2017-10-26 DIAGNOSIS — I10 Essential (primary) hypertension: Secondary | ICD-10-CM | POA: Diagnosis not present

## 2017-10-26 DIAGNOSIS — F419 Anxiety disorder, unspecified: Secondary | ICD-10-CM | POA: Insufficient documentation

## 2017-10-26 DIAGNOSIS — R51 Headache: Secondary | ICD-10-CM | POA: Diagnosis not present

## 2017-10-26 DIAGNOSIS — G44329 Chronic post-traumatic headache, not intractable: Secondary | ICD-10-CM | POA: Diagnosis not present

## 2017-10-26 DIAGNOSIS — R42 Dizziness and giddiness: Secondary | ICD-10-CM | POA: Insufficient documentation

## 2017-10-26 DIAGNOSIS — H9192 Unspecified hearing loss, left ear: Secondary | ICD-10-CM | POA: Insufficient documentation

## 2017-10-26 NOTE — Progress Notes (Signed)
Subjective:    Patient ID: Anthony Singleton, male    DOB: 04/26/1971, 47 y.o.   MRN: [id number]  HPI   Anthony Singleton is here in follow up of his traumatic brain injury. He returned to work without too many problems. He has struggled with the intense heat however which has made him pause at times. He was under the car looking a fuel pump and had an attack of vertigo  His back and left knee have been improving but he still notices the knee when he goes down stairs. He has returned to work and is on 8-10 hour days currently.   Since returning to work his sleep has improved. Typically when he's done for the day, he finds it very easy to fall asleep at night. He's not taking anything scheduled for sleep at this point.   Headaches have been minimal. He remains on depakote for these and feels that it has helped.     Pain Inventory Average Pain 3 Pain Right Now 2 My pain is burning and tingling  In the last 24 hours, has pain interfered with the following? General activity 5 Relation with others 6 Enjoyment of life 5 What TIME of day is your pain at its worst? evening Sleep (in general) Good  Pain is worse with: walking, standing and some activites Pain improves with: rest, heat/ice and therapy/exercise Relief from Meds: .  Mobility ability to climb steps?  yes do you drive?  yes  Function employed # of hrs/week 40  Neuro/Psych dizziness  Prior Studies Any changes since last visit?  no  Physicians involved in your care Any changes since last visit?  no   Family History  Problem Relation Age of Onset  . Stroke Mother   . Stroke Father    Social History   Socioeconomic History  . Marital status: Married    Spouse name: Not on file  . Number of children: Not on file  . Years of education: Not on file  . Highest education level: Not on file  Occupational History  . Not on file  Social Needs  . Financial resource strain: Not on file  . Food insecurity:    Worry:  Not on file    Inability: Not on file  . Transportation needs:    Medical: Not on file    Non-medical: Not on file  Tobacco Use  . Smoking status: Never Smoker  . Smokeless tobacco: Never Used  Substance and Sexual Activity  . Alcohol use: No    Frequency: Never  . Drug use: Yes    Types: Amphetamines, Marijuana    Comment: "crack" pipe found on person, + for amphetamines, THC  . Sexual activity: Yes  Lifestyle  . Physical activity:    Days per week: Not on file    Minutes per session: Not on file  . Stress: Not on file  Relationships  . Social connections:    Talks on phone: Not on file    Gets together: Not on file    Attends religious service: Not on file    Active member of club or organization: Not on file    Attends meetings of clubs or organizations: Not on file    Relationship status: Not on file  Other Topics Concern  . Not on file  Social History Narrative  . Not on file   Past Surgical History:  Procedure Laterality Date  . APPENDECTOMY     when he was [employer], per wife  Past Medical History:  Diagnosis Date  . Anxiety disorder    hight levels of stress/does not like medications  . Gilbert's syndrome   . 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 Tinnitus, left    There were no vitals taken for this visit.  Opioid Risk Score:   Fall Risk Score:  `1  Depression screen PHQ 2/9  No flowsheet data found.   Review of Systems  Constitutional: Negative.   HENT: Negative.   Eyes: Negative.   Respiratory: Negative.   Cardiovascular: Negative.   Gastrointestinal: Negative.   Endocrine: Negative.   Genitourinary: Negative.   Musculoskeletal: Positive for back pain.       Leg pain  Skin: Negative.   Allergic/Immunologic: Negative.   Neurological: Negative.   Hematological: Negative.   Psychiatric/Behavioral: Negative.   All other systems reviewed and are negative.      Objective:   Physical Exam  General: No acute distress HEENT: EOMI,  oral membranes moist Cards: reg rate  Chest: normal effort Abdomen: Soft, NT, ND Skin: dry, intact Extremities: no edema  . Neuro:He is alert and oriented x3.  normal insight and awareness..  Strength 5 out of 5.  Balance is intact. Nystagmus 2-3 beats with right and upward gaze.  Musc/Skel: no wrist or knee pain with palpation or ROM today.  Psych: Patient is pleasant and appropriate.  No impulsivity seen today      Assessment & Plan:  Medical Problem List and Plan: 1. Hx of traumatic brain injury -continue with HEP -continue to work at [employer]. 8-10 hours per day.    -reviewed sensible activities 2. Headaches:much improved -continuedepakote250 mg twice daily at least for the summer. 3. Right wrist pain. ?ligamentous injury--improved -xrays without acute findings -continue wrist splint for now -Oral diclofenac 50 mg twice daily 4.Behavior/sleep -seroquel prn for sleep  -generally off medication -continuedepakote for mood/headacheas above---perhaps stop this Fall 5. Seizures: Depakote will serve as his form of seizure prophylaxis. 5. Left knee pain: -Appears more meniscus in etiology at last examination/meniscus  -improving. Maintain reasonable activities and strengthening exercising  -continue diclofenac          6 Persistent vertigo: needs to perform HEP daily. Not sure he is routinely  -if symptoms worsen or persist, consider revisiting therapy    15 minutes of direct patient care time was spent today reviewing the multiple issues above. I will see him back in about 3 month's time for follow-up

## 2017-10-26 NOTE — Patient Instructions (Signed)
PLEASE FEEL FREE TO CALL OUR OFFICE WITH ANY PROBLEMS OR QUESTIONS [phone])     WORK ON YOUR VESTIBULAR EXERCISES DAILY!!!!!!

## 2017-10-29 ENCOUNTER — Other Ambulatory Visit: Payer: Self-pay | Admitting: Physical Medicine & Rehabilitation

## 2017-10-29 DIAGNOSIS — R4689 Other symptoms and signs involving appearance and behavior: Secondary | ICD-10-CM

## 2017-10-29 DIAGNOSIS — G44329 Chronic post-traumatic headache, not intractable: Secondary | ICD-10-CM

## 2017-10-29 DIAGNOSIS — S069X0S Unspecified intracranial injury without loss of consciousness, sequela: Principal | ICD-10-CM

## 2017-11-02 ENCOUNTER — Other Ambulatory Visit: Payer: Self-pay | Admitting: Physical Medicine & Rehabilitation

## 2017-11-02 DIAGNOSIS — M1712 Unilateral primary osteoarthritis, left knee: Secondary | ICD-10-CM

## 2017-11-02 DIAGNOSIS — M25531 Pain in right wrist: Secondary | ICD-10-CM

## 2017-11-04 ENCOUNTER — Other Ambulatory Visit: Payer: Self-pay | Admitting: Physical Medicine & Rehabilitation

## 2017-11-04 DIAGNOSIS — R4689 Other symptoms and signs involving appearance and behavior: Secondary | ICD-10-CM

## 2017-11-04 DIAGNOSIS — S069X0S Unspecified intracranial injury without loss of consciousness, sequela: Principal | ICD-10-CM

## 2017-11-04 DIAGNOSIS — G44329 Chronic post-traumatic headache, not intractable: Secondary | ICD-10-CM

## 2017-12-05 ENCOUNTER — Other Ambulatory Visit: Payer: Self-pay | Admitting: Physical Medicine & Rehabilitation

## 2017-12-05 DIAGNOSIS — R4689 Other symptoms and signs involving appearance and behavior: Secondary | ICD-10-CM

## 2017-12-05 DIAGNOSIS — G44329 Chronic post-traumatic headache, not intractable: Secondary | ICD-10-CM

## 2017-12-05 DIAGNOSIS — S069X0S Unspecified intracranial injury without loss of consciousness, sequela: Principal | ICD-10-CM

## 2018-01-20 ENCOUNTER — Encounter
Payer: BLUE CROSS/BLUE SHIELD | Attending: Physical Medicine & Rehabilitation | Admitting: Physical Medicine & Rehabilitation

## 2018-01-20 ENCOUNTER — Encounter: Payer: Self-pay | Admitting: Physical Medicine & Rehabilitation

## 2018-01-20 VITALS — BP 125/87 | HR 77 | Ht 78.0 in | Wt 234.0 lb

## 2018-01-20 DIAGNOSIS — R51 Headache: Secondary | ICD-10-CM | POA: Insufficient documentation

## 2018-01-20 DIAGNOSIS — R569 Unspecified convulsions: Secondary | ICD-10-CM | POA: Insufficient documentation

## 2018-01-20 DIAGNOSIS — R42 Dizziness and giddiness: Secondary | ICD-10-CM | POA: Diagnosis not present

## 2018-01-20 DIAGNOSIS — N522 Drug-induced erectile dysfunction: Secondary | ICD-10-CM | POA: Diagnosis not present

## 2018-01-20 DIAGNOSIS — S069X0S Unspecified intracranial injury without loss of consciousness, sequela: Secondary | ICD-10-CM | POA: Diagnosis not present

## 2018-01-20 DIAGNOSIS — Z87820 Personal history of traumatic brain injury: Secondary | ICD-10-CM | POA: Diagnosis present

## 2018-01-20 DIAGNOSIS — I10 Essential (primary) hypertension: Secondary | ICD-10-CM | POA: Diagnosis not present

## 2018-01-20 DIAGNOSIS — H9192 Unspecified hearing loss, left ear: Secondary | ICD-10-CM | POA: Insufficient documentation

## 2018-01-20 DIAGNOSIS — R4689 Other symptoms and signs involving appearance and behavior: Secondary | ICD-10-CM | POA: Diagnosis not present

## 2018-01-20 DIAGNOSIS — G44329 Chronic post-traumatic headache, not intractable: Secondary | ICD-10-CM

## 2018-01-20 DIAGNOSIS — F419 Anxiety disorder, unspecified: Secondary | ICD-10-CM | POA: Insufficient documentation

## 2018-01-20 MED ORDER — SILDENAFIL CITRATE 50 MG PO TABS: 50.0000 mg | ORAL_TABLET | Freq: Every day | ORAL | 0 refills | Status: DC | PRN

## 2018-01-20 NOTE — Patient Instructions (Signed)
PLEASE FEEL FREE TO CALL OUR OFFICE WITH ANY PROBLEMS OR QUESTIONS ([phone])      

## 2018-01-20 NOTE — Progress Notes (Signed)
Subjective:    Patient ID: Anthony Singleton, male    DOB: 08/18/1970, 47 y.o.   MRN: [id number]  HPI   Anthony Singleton is here in follow up of his TBI. He has had a good summer. His vertigo is gone. He still fatigues with the heat but knows his limits better. His behavior is better although he still get frustrated with people at times  He can contain the feelings to himself however. When he's fatigued his temper can be less. He is sleeping well with seroquel.   He has noticed some difficulties with erectile dysfunction and it has affected his relationship with his wife to an extent. Often he is too fatigued from work or from the day at night which has affected things too.     Pain Inventory Average Pain 2 Pain Right Now 0 My pain is intermittent, burning and stabbing  In the last 24 hours, has pain interfered with the following? General activity 3 Relation with others 6 Enjoyment of life 6 What TIME of day is your pain at its worst? evening Sleep (in general) Fair  Pain is worse with: bending and sitting Pain improves with: rest and heat/ice Relief from Meds: 3  Mobility walk without assistance ability to climb steps?  yes do you drive?  yes  Function employed # of hrs/week 40  Neuro/Psych bladder control problems weakness confusion  Prior Studies Any changes since last visit?  no  Physicians involved in your care Any changes since last visit?  no   Family History  Problem Relation Age of Onset  . Stroke Mother   . Stroke Father    Social History   Socioeconomic History  . Marital status: Married    Spouse name: Not on file  . Number of children: Not on file  . Years of education: Not on file  . Highest education level: Not on file  Occupational History  . Not on file  Social Needs  . Financial resource strain: Not on file  . Food insecurity:    Worry: Not on file    Inability: Not on file  . Transportation needs:    Medical: Not on file   Non-medical: Not on file  Tobacco Use  . Smoking status: Never Smoker  . Smokeless tobacco: Never Used  Substance and Sexual Activity  . Alcohol use: No    Frequency: Never  . Drug use: Yes    Types: Amphetamines, Marijuana    Comment: "crack" pipe found on person, + for amphetamines, THC  . Sexual activity: Yes  Lifestyle  . Physical activity:    Days per week: Not on file    Minutes per session: Not on file  . Stress: Not on file  Relationships  . Social connections:    Talks on phone: Not on file    Gets together: Not on file    Attends religious service: Not on file    Active member of club or organization: Not on file    Attends meetings of clubs or organizations: Not on file    Relationship status: Not on file  Other Topics Concern  . Not on file  Social History Narrative  . Not on file   Past Surgical History:  Procedure Laterality Date  . APPENDECTOMY     when he was [employer], per wife   Past Medical History:  Diagnosis Date  . Anxiety disorder    hight levels of stress/does not like medications  . Gilbert's syndrome   .  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 Tinnitus, left    There were no vitals taken for this visit.  Opioid Risk Score:   Fall Risk Score:  `1  Depression screen PHQ 2/9  No flowsheet data found.   Review of Systems  Constitutional: Negative.   HENT: Negative.   Eyes: Negative.   Respiratory: Negative.   Cardiovascular: Negative.   Gastrointestinal: Negative.   Endocrine: Negative.   Genitourinary: Positive for difficulty urinating.  Musculoskeletal: Positive for arthralgias and back pain.  Skin: Negative.   Allergic/Immunologic: Negative.   Neurological: Positive for weakness.  Hematological: Negative.   Psychiatric/Behavioral: Positive for confusion.  All other systems reviewed and are negative.      Objective:   Physical Exam  General: No acute distress HEENT: EOMI, oral membranes moist Cards: reg rate  Chest:  normal effort Abdomen: Soft, NT, ND Skin: dry, intact Extremities: no edema . Neuro:He is alert and oriented x3. normal insight and awareness.. Strength 5 out of 5. Balance is intact. Nystagmus 2-3 beats with right and upward gaze.  Musc/Skel:no wrist or knee pain with palpation or ROM today.  Psych:Patient is pleasant and appropriate. No impulsivity seen today      Assessment & Plan:  Medical Problem List and Plan: 1. Hx of traumatic brain injury -continue with HEP -continue to work at [employer]. 8-10 hours per day.         -reviewed sensible activities, take breaks 2. Headaches:much improved -continuedepakote250 mg twice daily  3. Right wrist pain.  improved -xrays without acute findings -continue wrist splint for now -Oral diclofenac 50 mg twice daily 4.Behavior/sleep -seroquel for sleep has been effective on a scheduled basis       -generally off medication -continuedepakote for mood/headacheas above--- dc this November? 5. Seizures: Depakote will serve as his form of seizure prophylaxis. 5. Left knee pain: improved/tolerable -likely meniscus, degenerative      -diclofenac prn 6   vertigo: improved. continue with HEP 7. Erectile dysfunction: viagra trial    of direct patient care time was spent today reviewing the multiple issues above. I will see him back in about67month's time for follow-up

## 2018-01-27 ENCOUNTER — Encounter: Payer: BLUE CROSS/BLUE SHIELD | Admitting: Physical Medicine & Rehabilitation

## 2018-03-30 ENCOUNTER — Other Ambulatory Visit: Payer: Self-pay | Admitting: Physical Medicine & Rehabilitation

## 2018-03-30 DIAGNOSIS — S069X0S Unspecified intracranial injury without loss of consciousness, sequela: Principal | ICD-10-CM

## 2018-03-30 DIAGNOSIS — G44329 Chronic post-traumatic headache, not intractable: Secondary | ICD-10-CM

## 2018-03-30 DIAGNOSIS — R4689 Other symptoms and signs involving appearance and behavior: Secondary | ICD-10-CM

## 2018-04-12 ENCOUNTER — Encounter: Payer: Self-pay | Admitting: Physical Medicine & Rehabilitation

## 2018-04-12 ENCOUNTER — Encounter
Payer: BLUE CROSS/BLUE SHIELD | Attending: Physical Medicine & Rehabilitation | Admitting: Physical Medicine & Rehabilitation

## 2018-04-12 VITALS — BP 113/80 | HR 85 | Resp 14 | Ht 78.0 in | Wt 223.0 lb

## 2018-04-12 DIAGNOSIS — H9192 Unspecified hearing loss, left ear: Secondary | ICD-10-CM | POA: Diagnosis not present

## 2018-04-12 DIAGNOSIS — G478 Other sleep disorders: Secondary | ICD-10-CM | POA: Diagnosis not present

## 2018-04-12 DIAGNOSIS — R42 Dizziness and giddiness: Secondary | ICD-10-CM | POA: Diagnosis not present

## 2018-04-12 DIAGNOSIS — F419 Anxiety disorder, unspecified: Secondary | ICD-10-CM | POA: Diagnosis not present

## 2018-04-12 DIAGNOSIS — I10 Essential (primary) hypertension: Secondary | ICD-10-CM | POA: Diagnosis not present

## 2018-04-12 DIAGNOSIS — Z87820 Personal history of traumatic brain injury: Secondary | ICD-10-CM | POA: Insufficient documentation

## 2018-04-12 DIAGNOSIS — G44329 Chronic post-traumatic headache, not intractable: Secondary | ICD-10-CM | POA: Diagnosis not present

## 2018-04-12 DIAGNOSIS — R51 Headache: Secondary | ICD-10-CM | POA: Insufficient documentation

## 2018-04-12 DIAGNOSIS — R569 Unspecified convulsions: Secondary | ICD-10-CM | POA: Insufficient documentation

## 2018-04-12 NOTE — Patient Instructions (Signed)
1. SEROQUEL: IF YOU WANT TO TRY BREAKING THIS IN HALF, YOU CAN.    2. DEPAKOTE: TAKE 250MG  AT NIGHT UNTIL BOTTLE IS EMPTY    PLEASE FEEL FREE TO CALL OUR OFFICE WITH ANY PROBLEMS OR QUESTIONS [phone])

## 2018-04-12 NOTE — Progress Notes (Signed)
Subjective:    Patient ID: Anthony Singleton, male    DOB: December 30, 1970, 47 y.o.   MRN: [id number]  HPI   Anthony Singleton is here in follow up of his TBI. He has continued to work full time. He "hits the wall" every couple weeks or so when he just feels exhausted. He simply will go and take a break/nap. He fell once at the work site but dodged any substantial sequelae.   Sleep has been good, he continues on seroquel which is effective. He is a little more emotional but has been able to function from a family and vocational standpoint  He hasn't had any seizures. He remains on depakote 250mg  bid. He missed his dose this morning.     Pain Inventory Average Pain 2 Pain Right Now 2 My pain is intermittent  In the last 24 hours, has pain interfered with the following? General activity 0 Relation with others 0 Enjoyment of life 0 What TIME of day is your pain at its worst? evening Sleep (in general) Good  Pain is worse with: unsure Pain improves with: medication and ? Relief from Meds: no pain meds  Mobility walk without assistance  Function employed # of hrs/week .  Neuro/Psych No problems in this area  Prior Studies Any changes since last visit?  no  Physicians involved in your care Any changes since last visit?  no   Family History  Problem Relation Age of Onset  . Stroke Mother   . Stroke Father    Social History   Socioeconomic History  . Marital status: Married    Spouse name: Not on file  . Number of children: Not on file  . Years of education: Not on file  . Highest education level: Not on file  Occupational History  . Not on file  Social Needs  . Financial resource strain: Not on file  . Food insecurity:    Worry: Not on file    Inability: Not on file  . Transportation needs:    Medical: Not on file    Non-medical: Not on file  Tobacco Use  . Smoking status: Never Smoker  . Smokeless tobacco: Never Used  Substance and Sexual Activity  . Alcohol  use: No    Frequency: Never  . Drug use: Yes    Types: Amphetamines, Marijuana    Comment: "crack" pipe found on person, + for amphetamines, THC  . Sexual activity: Yes  Lifestyle  . Physical activity:    Days per week: Not on file    Minutes per session: Not on file  . Stress: Not on file  Relationships  . Social connections:    Talks on phone: Not on file    Gets together: Not on file    Attends religious service: Not on file    Active member of club or organization: Not on file    Attends meetings of clubs or organizations: Not on file    Relationship status: Not on file  Other Topics Concern  . Not on file  Social History Narrative  . Not on file   Past Surgical History:  Procedure Laterality Date  . APPENDECTOMY     when he was [employer], per wife   Past Medical History:  Diagnosis Date  . Anxiety disorder    hight levels of stress/does not like medications  . Gilbert's syndrome   . 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 Tinnitus, left  BP 113/80 (BP Location: Left Arm, Patient Position: Sitting, Cuff Size: Large)   Pulse 85   Resp 14   Ht 6\' 6"  (1.981 m)   Wt 223 lb (101.2 kg)   SpO2 96%   BMI 25.77 kg/m   Opioid Risk Score:   Fall Risk Score:  `1  Depression screen PHQ 2/9  No flowsheet data found.  Review of Systems  Constitutional: Negative.   HENT: Negative.   Eyes: Negative.   Respiratory: Negative.   Cardiovascular: Negative.   Gastrointestinal: Negative.   Endocrine: Negative.   Genitourinary: Negative.   Musculoskeletal: Positive for arthralgias.  Skin: Negative.   Allergic/Immunologic: Negative.   Neurological: Positive for headaches.  Hematological: Negative.   Psychiatric/Behavioral: Negative.   All other systems reviewed and are negative.      Objective:   Physical Exam General: No acute distress HEENT: EOMI, oral membranes moist Cards: reg rate  Chest: normal effort Abdomen: Soft, NT, ND Skin: dry,  intact Extremities: no edema  Neuro:NORMAL strength, sensation. Cognitively intact. Normal balance   Musc/Skel:no wrist or knee pain with palpation or ROM today. Psych:pleasant      Assessment & Plan:  Medical Problem List and Plan: 1. Hx of traumatic brain injury -continue with HEP, work 2. Headaches:resolved -wean depakote to off over next month  3. Right wrist pain: -diclofenac prn 4.Behavior/sleep -seroquel for sleep has been effective on a scheduled basis   -can continue 25-50mg  qhs -wean off depakote as above 5. Seizures:wean off 5. Left knee pain: improved/tolerable -likely meniscus, degenerative          -diclofenac prn 6  vertigo: resolved 7. Erectile dysfunction: viagra trial    of direct patient care time was spent today reviewing the multiple issues above. I will see him back in about6 month's time for follow-up

## 2018-05-23 ENCOUNTER — Other Ambulatory Visit: Payer: Self-pay | Admitting: Physical Medicine & Rehabilitation

## 2018-05-23 DIAGNOSIS — G44329 Chronic post-traumatic headache, not intractable: Secondary | ICD-10-CM

## 2018-05-23 DIAGNOSIS — S069X0S Unspecified intracranial injury without loss of consciousness, sequela: Principal | ICD-10-CM

## 2018-05-23 DIAGNOSIS — R4689 Other symptoms and signs involving appearance and behavior: Secondary | ICD-10-CM

## 2018-07-13 ENCOUNTER — Encounter
Payer: BLUE CROSS/BLUE SHIELD | Attending: Physical Medicine & Rehabilitation | Admitting: Physical Medicine & Rehabilitation

## 2018-07-13 DIAGNOSIS — R569 Unspecified convulsions: Secondary | ICD-10-CM | POA: Insufficient documentation

## 2018-07-13 DIAGNOSIS — F419 Anxiety disorder, unspecified: Secondary | ICD-10-CM | POA: Insufficient documentation

## 2018-07-13 DIAGNOSIS — R51 Headache: Secondary | ICD-10-CM | POA: Insufficient documentation

## 2018-07-13 DIAGNOSIS — H9192 Unspecified hearing loss, left ear: Secondary | ICD-10-CM | POA: Insufficient documentation

## 2018-07-13 DIAGNOSIS — I10 Essential (primary) hypertension: Secondary | ICD-10-CM | POA: Insufficient documentation

## 2018-07-13 DIAGNOSIS — R42 Dizziness and giddiness: Secondary | ICD-10-CM | POA: Insufficient documentation

## 2018-07-13 DIAGNOSIS — Z87820 Personal history of traumatic brain injury: Secondary | ICD-10-CM | POA: Insufficient documentation

## 2018-09-13 ENCOUNTER — Other Ambulatory Visit: Payer: Self-pay | Admitting: Physical Medicine & Rehabilitation

## 2018-09-13 DIAGNOSIS — S069X0S Unspecified intracranial injury without loss of consciousness, sequela: Principal | ICD-10-CM

## 2018-09-13 DIAGNOSIS — G44329 Chronic post-traumatic headache, not intractable: Secondary | ICD-10-CM

## 2018-09-13 DIAGNOSIS — R4689 Other symptoms and signs involving appearance and behavior: Secondary | ICD-10-CM

## 2019-01-10 ENCOUNTER — Telehealth: Payer: Self-pay | Admitting: Physical Medicine & Rehabilitation

## 2019-01-10 NOTE — Telephone Encounter (Signed)
Please see message below, patient also called triage line and would not elaborate on why he needed to speak with the provider but just that it is neccessary.

## 2019-01-10 NOTE — Telephone Encounter (Signed)
Pt called stating he needed to see Dr Naaman Plummer right away that his mood and behavior had changed. He feels angry and short with people. He has a headache most of the time. He would like to speak to Dr. Naaman Plummer. He stated multiple times Dr Naaman Plummer said to call him if he needed him. I scheduled him for first available of 01/26/2019 and placed him on wait list.

## 2019-01-10 NOTE — Telephone Encounter (Signed)
Spoke to pt re: some strategies to look at for work. Will talk about other options at office visit

## 2019-01-19 ENCOUNTER — Encounter: Payer: Self-pay | Admitting: Physical Medicine & Rehabilitation

## 2019-01-19 ENCOUNTER — Encounter
Payer: BC Managed Care – PPO | Attending: Physical Medicine & Rehabilitation | Admitting: Physical Medicine & Rehabilitation

## 2019-01-19 ENCOUNTER — Other Ambulatory Visit: Payer: Self-pay

## 2019-01-19 VITALS — BP 139/89 | HR 90 | Temp 98.9°F | Ht 78.0 in | Wt 228.0 lb

## 2019-01-19 DIAGNOSIS — S069X0S Unspecified intracranial injury without loss of consciousness, sequela: Secondary | ICD-10-CM | POA: Insufficient documentation

## 2019-01-19 DIAGNOSIS — R4689 Other symptoms and signs involving appearance and behavior: Secondary | ICD-10-CM | POA: Diagnosis not present

## 2019-01-19 DIAGNOSIS — G44329 Chronic post-traumatic headache, not intractable: Secondary | ICD-10-CM | POA: Diagnosis present

## 2019-01-19 DIAGNOSIS — S062X9A Diffuse traumatic brain injury with loss of consciousness of unspecified duration, initial encounter: Secondary | ICD-10-CM | POA: Diagnosis present

## 2019-01-19 MED ORDER — QUETIAPINE FUMARATE 50 MG PO TABS: ORAL_TABLET | ORAL | 4 refills | Status: DC

## 2019-01-19 MED ORDER — ESCITALOPRAM OXALATE 5 MG PO TABS: 5.0000 mg | ORAL_TABLET | Freq: Every day | ORAL | 3 refills | Status: DC
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
## 2019-01-19 NOTE — Patient Instructions (Signed)
FOCUS ON RESTORATIVE SLEEP EVERY NIGHT--8 HOUR TARGET/GOAL   DELEGATE SOME OF YOUR LESS IMPORTANT WORK   GET OUTSIDE AND GET SOME EXERCISE EACH DAY   REALIZE THAT YOU HAVE BEEN CHANGED BY YOUR INJURY BUT THAT YOU CAN ALSO ADAPT TO KEEP MOVING FORWARD AS WELL.

## 2019-01-26 ENCOUNTER — Ambulatory Visit: Payer: Self-pay | Admitting: Physical Medicine & Rehabilitation

## 2019-03-23 ENCOUNTER — Encounter: Payer: BC Managed Care – PPO | Admitting: Physical Medicine & Rehabilitation

## 2019-03-30 ENCOUNTER — Encounter
Payer: BC Managed Care – PPO | Attending: Physical Medicine & Rehabilitation | Admitting: Physical Medicine & Rehabilitation

## 2019-03-30 DIAGNOSIS — R4689 Other symptoms and signs involving appearance and behavior: Secondary | ICD-10-CM | POA: Insufficient documentation

## 2019-03-30 DIAGNOSIS — G44329 Chronic post-traumatic headache, not intractable: Secondary | ICD-10-CM | POA: Insufficient documentation

## 2019-03-30 DIAGNOSIS — S062X9A Diffuse traumatic brain injury with loss of consciousness of unspecified duration, initial encounter: Secondary | ICD-10-CM | POA: Insufficient documentation

## 2019-03-30 DIAGNOSIS — S069X0S Unspecified intracranial injury without loss of consciousness, sequela: Secondary | ICD-10-CM | POA: Insufficient documentation
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
## 2019-10-13 IMAGING — CT CT ANGIO CHEST
2 of 7 series · 19 of 46 positions shown · IV contrast (APPLIED)
Comparison: 05/09/2017

CLINICAL DATA: Persistent tachycardia.

EXAM:
CT ANGIOGRAPHY CHEST WITH CONTRAST
TECHNIQUE: Multidetector CT imaging of the chest was performed using the
standard protocol during bolus administration of intravenous
contrast. Multiplanar CT image reconstructions and MIPs were
obtained to evaluate the vascular anatomy.
CONTRAST:  100mL [id number] IOPAMIDOL ([id number]-S92) INJECTION 76%

[Series 8: thins · axial · 0.65mm/px · z∈[-265,+15]mm · 16 of 449 slices shown]
[im 25/449  lung]
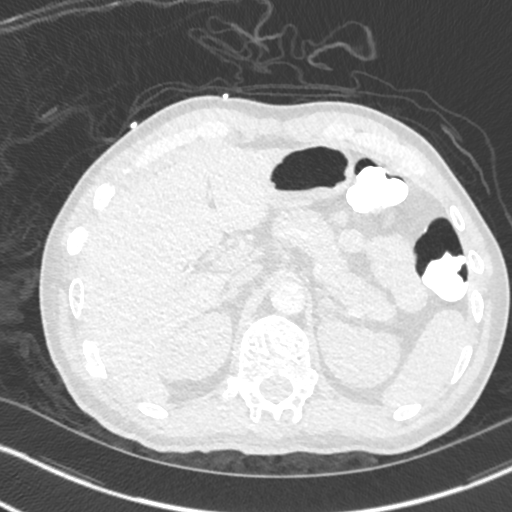
[im 50/449  soft-tissue]
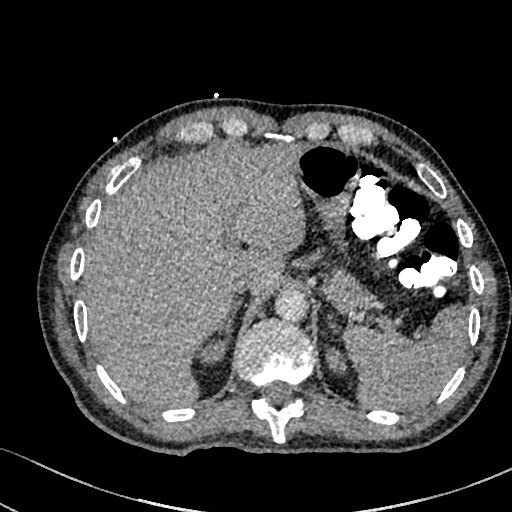
[im 75/449  lung]
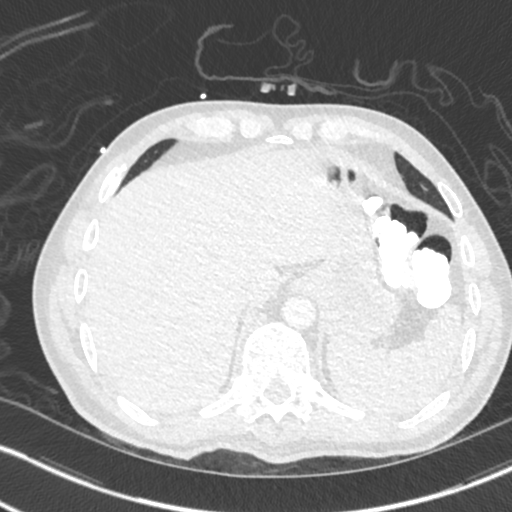
[im 100/449  soft-tissue]
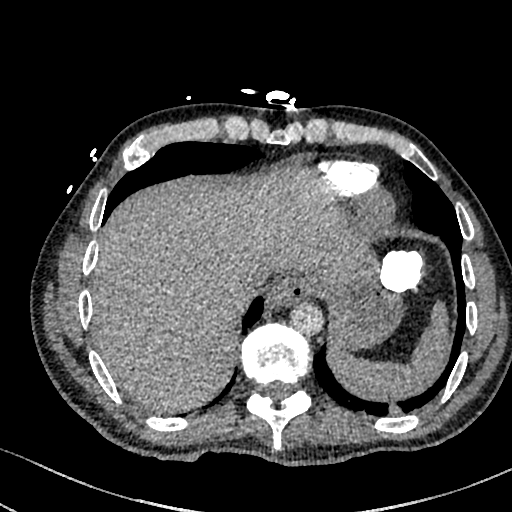
[im 125/449  lung]
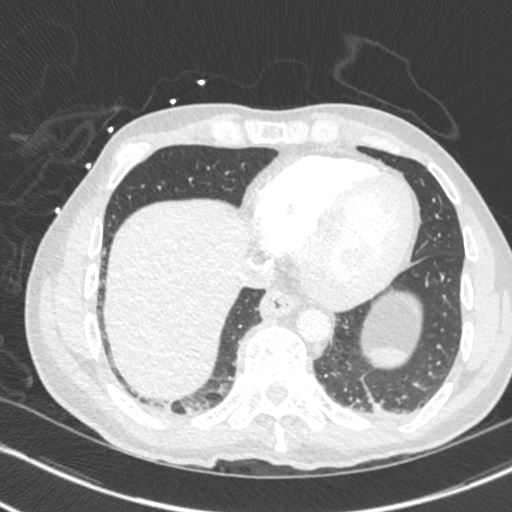
[im 150/449  soft-tissue]
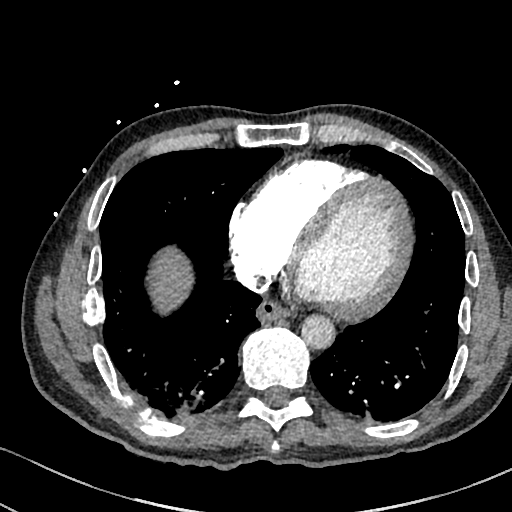
[im 175/449  lung]
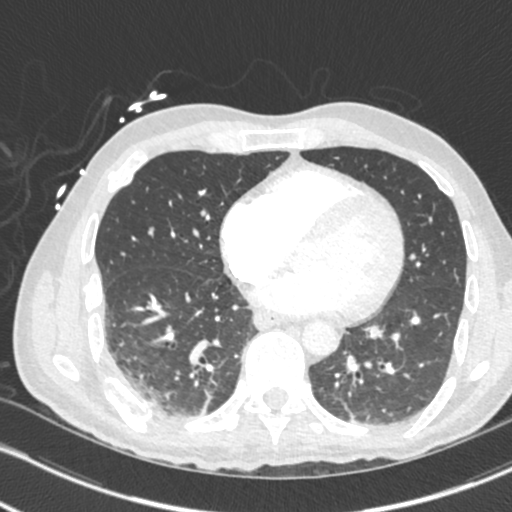
[im 200/449  soft-tissue]
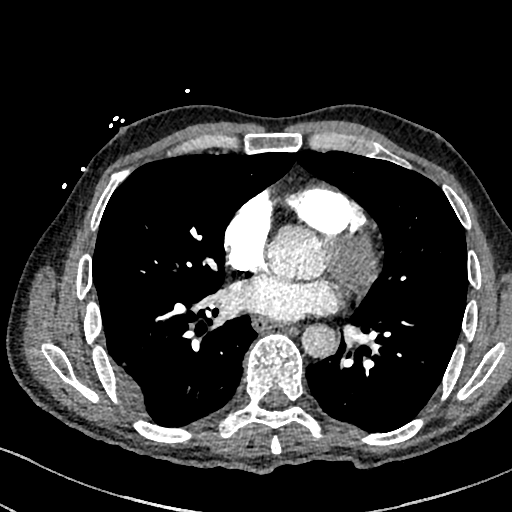
[im 249/449  lung]
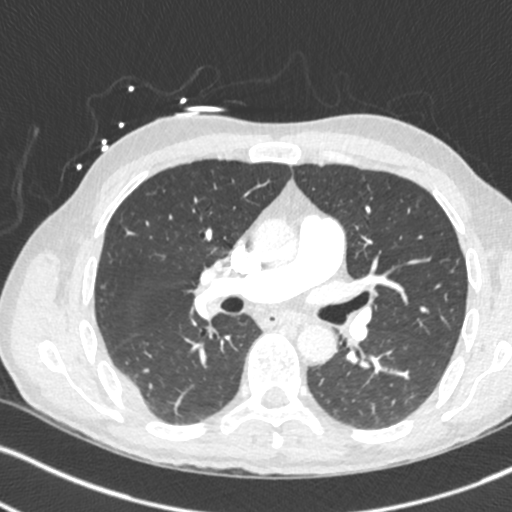
[im 274/449  soft-tissue]
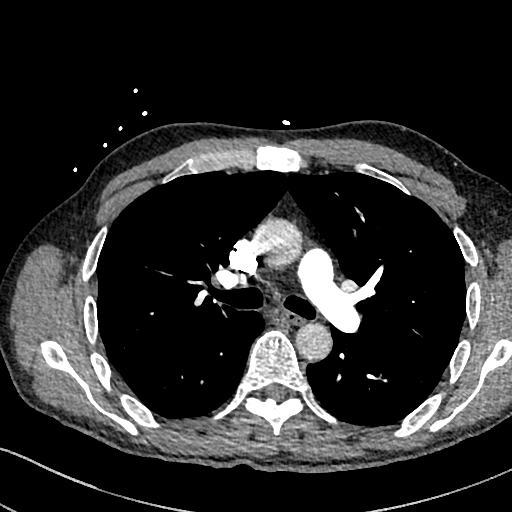
[im 299/449  lung]
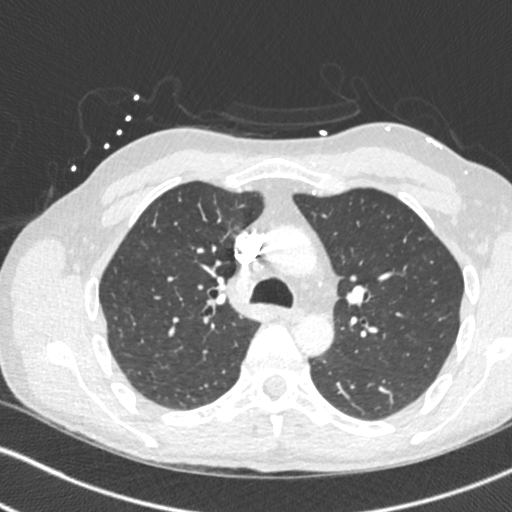
[im 324/449  soft-tissue]
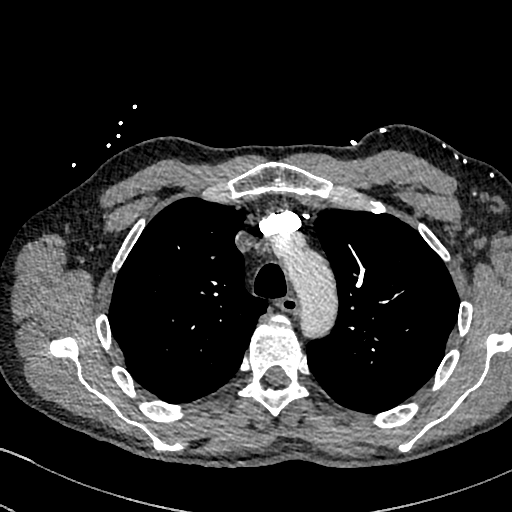
[im 349/449  lung]
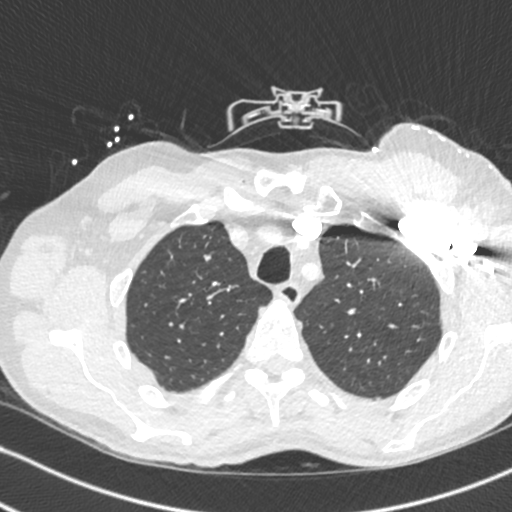
[im 374/449  soft-tissue]
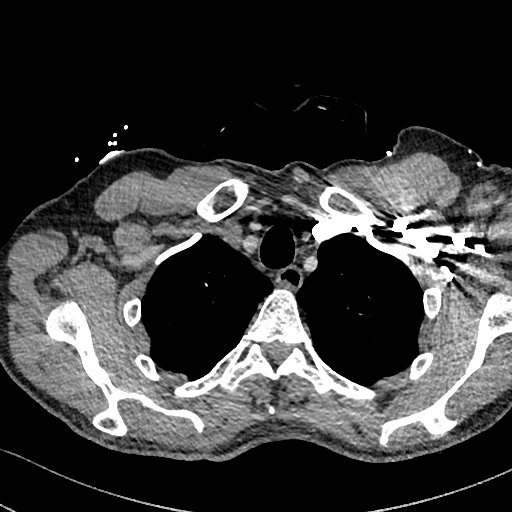
[im 399/449  lung]
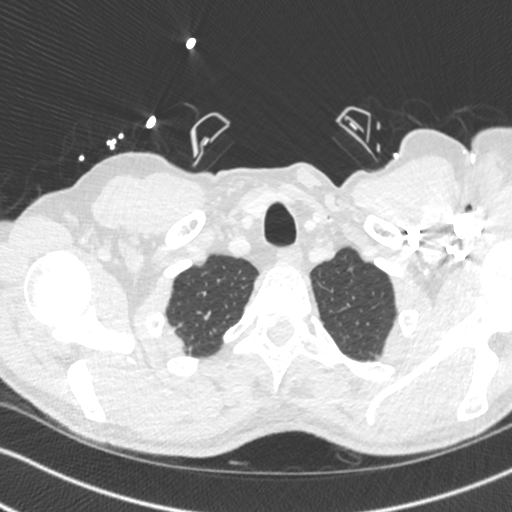
[im 424/449  soft-tissue]
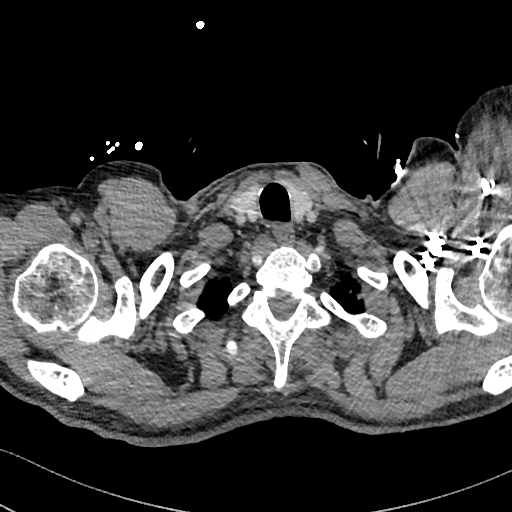

[Series 9: cor · coronal · 0.62mm/px · 3 of 130 slices shown]
[im 33/130  soft-tissue]
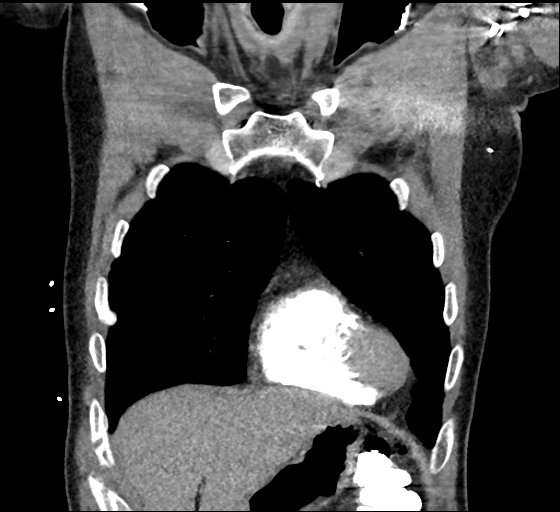
[im 65/130  soft-tissue]
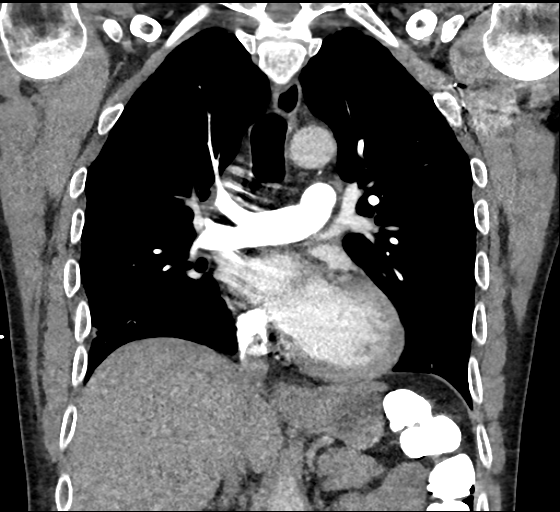
[im 97/130  soft-tissue]
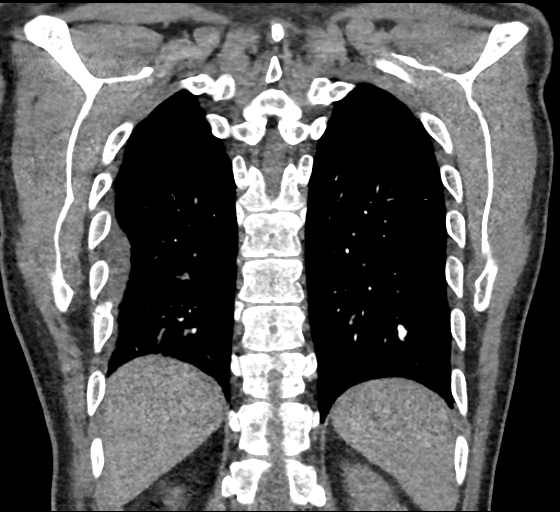

[19 of 46 positions shown; findings below may reference images not displayed]

FINDINGS: Cardiovascular: Satisfactory opacification of the pulmonary arteries
to the segmental level. No evidence of pulmonary embolism. Normal
heart size. No pericardial effusion.

Mediastinum/Nodes: No enlarged mediastinal, hilar, or axillary lymph
nodes. Thyroid gland, trachea, and esophagus demonstrate no
significant findings.

Lungs/Pleura: Interval resolution of pleural fluid since previous
study. Small residual loculated collection along the right lateral
chest wall may represent an extrapleural hematoma. Atelectasis in
the lung bases. No pneumothorax. Airways are patent.

Upper Abdomen: No acute abnormality.

Musculoskeletal: Multiple healing right rib fractures as previously
identified.

Review of the MIP images confirms the above findings.
IMPRESSION: 1. No evidence of significant pulmonary embolus.
2. No evidence of active pulmonary disease.
3. Multiple healing right rib fractures as previously identified.
Small residual loculated collection along the right lateral chest
wall may represent extrapleural hematoma. Improvement of previous
pleural fluid.

## 2020-01-09 ENCOUNTER — Other Ambulatory Visit: Payer: Self-pay | Admitting: Physical Medicine & Rehabilitation

## 2020-01-09 DIAGNOSIS — S069X0S Unspecified intracranial injury without loss of consciousness, sequela: Secondary | ICD-10-CM

## 2020-01-09 DIAGNOSIS — S062X9A Diffuse traumatic brain injury with loss of consciousness of unspecified duration, initial encounter: Secondary | ICD-10-CM

## 2020-01-09 DIAGNOSIS — R4689 Other symptoms and signs involving appearance and behavior: Secondary | ICD-10-CM

## 2022-04-09 DIAGNOSIS — Z5329 Procedure and treatment not carried out because of patient's decision for other reasons: Secondary | ICD-10-CM | POA: Diagnosis not present

## 2022-04-09 DIAGNOSIS — Z8679 Personal history of other diseases of the circulatory system: Secondary | ICD-10-CM | POA: Diagnosis not present

## 2022-04-09 DIAGNOSIS — G9389 Other specified disorders of brain: Secondary | ICD-10-CM | POA: Diagnosis not present

## 2022-04-09 DIAGNOSIS — R569 Unspecified convulsions: Secondary | ICD-10-CM | POA: Diagnosis not present

## 2023-06-23 DIAGNOSIS — R000 Tachycardia, unspecified: Secondary | ICD-10-CM | POA: Diagnosis not present

## 2023-06-23 DIAGNOSIS — R079 Chest pain, unspecified: Secondary | ICD-10-CM | POA: Diagnosis not present

## 2023-06-23 DIAGNOSIS — R030 Elevated blood-pressure reading, without diagnosis of hypertension: Secondary | ICD-10-CM | POA: Diagnosis not present

## 2023-06-23 DIAGNOSIS — F1290 Cannabis use, unspecified, uncomplicated: Secondary | ICD-10-CM | POA: Diagnosis not present

## 2023-06-23 DIAGNOSIS — R0781 Pleurodynia: Secondary | ICD-10-CM | POA: Diagnosis not present

## 2023-06-23 DIAGNOSIS — J90 Pleural effusion, not elsewhere classified: Secondary | ICD-10-CM | POA: Diagnosis not present

## 2023-06-23 DIAGNOSIS — R7989 Other specified abnormal findings of blood chemistry: Secondary | ICD-10-CM | POA: Diagnosis not present

## 2023-06-23 DIAGNOSIS — J189 Pneumonia, unspecified organism: Secondary | ICD-10-CM | POA: Diagnosis not present

## 2023-06-23 DIAGNOSIS — Z1152 Encounter for screening for COVID-19: Secondary | ICD-10-CM | POA: Diagnosis not present

## 2023-06-23 DIAGNOSIS — Z87891 Personal history of nicotine dependence: Secondary | ICD-10-CM | POA: Diagnosis not present

## 2023-06-23 DIAGNOSIS — R0602 Shortness of breath: Secondary | ICD-10-CM | POA: Diagnosis not present

## 2023-06-23 DIAGNOSIS — F15920 Other stimulant use, unspecified with intoxication, uncomplicated: Secondary | ICD-10-CM | POA: Diagnosis not present

## 2023-06-23 DIAGNOSIS — I4891 Unspecified atrial fibrillation: Secondary | ICD-10-CM | POA: Diagnosis not present

## 2023-06-24 DIAGNOSIS — R7989 Other specified abnormal findings of blood chemistry: Secondary | ICD-10-CM | POA: Diagnosis not present

## 2023-06-24 DIAGNOSIS — J90 Pleural effusion, not elsewhere classified: Secondary | ICD-10-CM | POA: Diagnosis not present

## 2023-06-24 DIAGNOSIS — J189 Pneumonia, unspecified organism: Secondary | ICD-10-CM | POA: Diagnosis not present

## 2023-07-07 ENCOUNTER — Inpatient Hospital Stay (HOSPITAL_COMMUNITY)
Admission: EM | Admit: 2023-07-07 | Discharge: 2023-07-20 | DRG: 286 | Disposition: A | Discharge status: Home / Self Care | Payer: BC Managed Care – PPO | Attending: Internal Medicine | Admitting: Internal Medicine

## 2023-07-07 ENCOUNTER — Other Ambulatory Visit: Payer: Self-pay

## 2023-07-07 ENCOUNTER — Emergency Department (HOSPITAL_COMMUNITY): Payer: BC Managed Care – PPO

## 2023-07-07 ENCOUNTER — Encounter (HOSPITAL_COMMUNITY): Payer: Self-pay | Admitting: Emergency Medicine

## 2023-07-07 DIAGNOSIS — I5082 Biventricular heart failure: Secondary | ICD-10-CM | POA: Diagnosis not present

## 2023-07-07 DIAGNOSIS — F419 Anxiety disorder, unspecified: Secondary | ICD-10-CM

## 2023-07-07 DIAGNOSIS — Z7901 Long term (current) use of anticoagulants: Secondary | ICD-10-CM | POA: Diagnosis not present

## 2023-07-07 DIAGNOSIS — I82811 Embolism and thrombosis of superficial veins of right lower extremities: Secondary | ICD-10-CM | POA: Diagnosis not present

## 2023-07-07 DIAGNOSIS — I428 Other cardiomyopathies: Secondary | ICD-10-CM | POA: Diagnosis not present

## 2023-07-07 DIAGNOSIS — I513 Intracardiac thrombosis, not elsewhere classified: Secondary | ICD-10-CM | POA: Diagnosis present

## 2023-07-07 DIAGNOSIS — I82C11 Acute embolism and thrombosis of right internal jugular vein: Secondary | ICD-10-CM | POA: Diagnosis present

## 2023-07-07 DIAGNOSIS — Z8249 Family history of ischemic heart disease and other diseases of the circulatory system: Secondary | ICD-10-CM | POA: Diagnosis not present

## 2023-07-07 DIAGNOSIS — I2699 Other pulmonary embolism without acute cor pulmonale: Secondary | ICD-10-CM

## 2023-07-07 DIAGNOSIS — R0600 Dyspnea, unspecified: Principal | ICD-10-CM

## 2023-07-07 DIAGNOSIS — I110 Hypertensive heart disease with heart failure: Secondary | ICD-10-CM | POA: Diagnosis not present

## 2023-07-07 DIAGNOSIS — Z1152 Encounter for screening for COVID-19: Secondary | ICD-10-CM

## 2023-07-07 DIAGNOSIS — F1510 Other stimulant abuse, uncomplicated: Secondary | ICD-10-CM | POA: Diagnosis not present

## 2023-07-07 DIAGNOSIS — J180 Bronchopneumonia, unspecified organism: Secondary | ICD-10-CM | POA: Diagnosis not present

## 2023-07-07 DIAGNOSIS — S069X9A Unspecified intracranial injury with loss of consciousness of unspecified duration, initial encounter: Secondary | ICD-10-CM | POA: Diagnosis present

## 2023-07-07 DIAGNOSIS — I82A11 Acute embolism and thrombosis of right axillary vein: Secondary | ICD-10-CM | POA: Diagnosis not present

## 2023-07-07 DIAGNOSIS — E8809 Other disorders of plasma-protein metabolism, not elsewhere classified: Secondary | ICD-10-CM | POA: Diagnosis present

## 2023-07-07 DIAGNOSIS — Z79899 Other long term (current) drug therapy: Secondary | ICD-10-CM | POA: Diagnosis not present

## 2023-07-07 DIAGNOSIS — Z8679 Personal history of other diseases of the circulatory system: Secondary | ICD-10-CM

## 2023-07-07 DIAGNOSIS — J90 Pleural effusion, not elsewhere classified: Secondary | ICD-10-CM | POA: Diagnosis present

## 2023-07-07 DIAGNOSIS — I82611 Acute embolism and thrombosis of superficial veins of right upper extremity: Secondary | ICD-10-CM | POA: Diagnosis not present

## 2023-07-07 DIAGNOSIS — Z87820 Personal history of traumatic brain injury: Secondary | ICD-10-CM

## 2023-07-07 DIAGNOSIS — E785 Hyperlipidemia, unspecified: Secondary | ICD-10-CM | POA: Diagnosis not present

## 2023-07-07 DIAGNOSIS — M542 Cervicalgia: Secondary | ICD-10-CM | POA: Diagnosis present

## 2023-07-07 DIAGNOSIS — R000 Tachycardia, unspecified: Secondary | ICD-10-CM | POA: Diagnosis present

## 2023-07-07 DIAGNOSIS — I2693 Single subsegmental pulmonary embolism without acute cor pulmonale: Secondary | ICD-10-CM | POA: Diagnosis not present

## 2023-07-07 DIAGNOSIS — E871 Hypo-osmolality and hyponatremia: Secondary | ICD-10-CM | POA: Diagnosis not present

## 2023-07-07 DIAGNOSIS — J189 Pneumonia, unspecified organism: Secondary | ICD-10-CM

## 2023-07-07 DIAGNOSIS — S065XAA Traumatic subdural hemorrhage with loss of consciousness status unknown, initial encounter: Secondary | ICD-10-CM | POA: Diagnosis present

## 2023-07-07 DIAGNOSIS — I5041 Acute combined systolic (congestive) and diastolic (congestive) heart failure: Principal | ICD-10-CM | POA: Diagnosis present

## 2023-07-07 DIAGNOSIS — Z823 Family history of stroke: Secondary | ICD-10-CM

## 2023-07-07 DIAGNOSIS — I959 Hypotension, unspecified: Secondary | ICD-10-CM | POA: Diagnosis present

## 2023-07-07 DIAGNOSIS — R918 Other nonspecific abnormal finding of lung field: Secondary | ICD-10-CM | POA: Diagnosis not present

## 2023-07-07 DIAGNOSIS — F1210 Cannabis abuse, uncomplicated: Secondary | ICD-10-CM | POA: Diagnosis present

## 2023-07-07 DIAGNOSIS — I429 Cardiomyopathy, unspecified: Secondary | ICD-10-CM

## 2023-07-07 DIAGNOSIS — R0602 Shortness of breath: Secondary | ICD-10-CM | POA: Diagnosis not present

## 2023-07-07 DIAGNOSIS — H9192 Unspecified hearing loss, left ear: Secondary | ICD-10-CM | POA: Diagnosis present

## 2023-07-07 DIAGNOSIS — R042 Hemoptysis: Secondary | ICD-10-CM | POA: Diagnosis not present

## 2023-07-07 DIAGNOSIS — N179 Acute kidney failure, unspecified: Secondary | ICD-10-CM | POA: Diagnosis not present

## 2023-07-07 DIAGNOSIS — I509 Heart failure, unspecified: Secondary | ICD-10-CM | POA: Diagnosis not present

## 2023-07-07 DIAGNOSIS — J9601 Acute respiratory failure with hypoxia: Secondary | ICD-10-CM | POA: Diagnosis present

## 2023-07-07 DIAGNOSIS — F1910 Other psychoactive substance abuse, uncomplicated: Secondary | ICD-10-CM | POA: Diagnosis present

## 2023-07-07 DIAGNOSIS — Z8701 Personal history of pneumonia (recurrent): Secondary | ICD-10-CM

## 2023-07-07 LAB — BASIC METABOLIC PANEL
Anion gap: 13 (ref 5–15)
BUN: 13 mg/dL (ref 6–20)
CO2: 20 mmol/L — ABNORMAL LOW (ref 22–32)
Calcium: 8.5 mg/dL — ABNORMAL LOW (ref 8.9–10.3)
Chloride: 103 mmol/L (ref 98–111)
Creatinine, Ser: 1.07 mg/dL (ref 0.61–1.24)
GFR, Estimated: >60 mL/min (ref >60)
Glucose, Bld: 84 mg/dL (ref 70–99)
Potassium: 4.2 mmol/L (ref 3.5–5.1)
Sodium: 136 mmol/L (ref 135–145)

## 2023-07-07 LAB — CBC
HCT: 39.5 % (ref 39.0–52.0)
Hemoglobin: 12.7 g/dL — ABNORMAL LOW (ref 13.0–17.0)
MCH: 32.6 pg (ref 26.0–34.0)
MCHC: 32.2 g/dL (ref 30.0–36.0)
MCV: 101.3 fL — ABNORMAL HIGH (ref 80.0–100.0)
Platelets: 448 10*3/uL — ABNORMAL HIGH (ref 150–400)
RBC: 3.9 MIL/uL — ABNORMAL LOW (ref 4.22–5.81)
RDW: 13.9 % (ref 11.5–15.5)
WBC: 7.8 10*3/uL (ref 4.0–10.5)
nRBC: 0 % (ref 0.0–0.2)

## 2023-07-07 LAB — TROPONIN I (HIGH SENSITIVITY): Troponin I (High Sensitivity): 26 ng/L — ABNORMAL HIGH (ref <18)

## 2023-07-07 LAB — BRAIN NATRIURETIC PEPTIDE: B Natriuretic Peptide: 1504.8 pg/mL — ABNORMAL HIGH (ref 0.0–100.0)

## 2023-07-07 LAB — D-DIMER, QUANTITATIVE: D-Dimer, Quant: 18.01 ug{FEU}/mL — ABNORMAL HIGH (ref 0.00–0.50)

## 2023-07-07 NOTE — ED Triage Notes (Signed)
  Patient comes in with SOB that has been going on for about 10 days.  Patient states he was diagnosed with pneumonia at [facility] at the end of January and was admitted but left AMA.  Followed up and got augmentin for pneumonia and finished course 2 days ago.  Patient states at night he has prolonged coughing episodes and has some blood in his sputum.  Endorses dyspnea and activity intolerance.  Pain 6/10, sharp.

## 2023-07-08 ENCOUNTER — Other Ambulatory Visit (HOSPITAL_COMMUNITY): Payer: BC Managed Care – PPO

## 2023-07-08 ENCOUNTER — Emergency Department (HOSPITAL_COMMUNITY): Payer: BC Managed Care – PPO

## 2023-07-08 DIAGNOSIS — I82A11 Acute embolism and thrombosis of right axillary vein: Secondary | ICD-10-CM | POA: Diagnosis present

## 2023-07-08 DIAGNOSIS — H9192 Unspecified hearing loss, left ear: Secondary | ICD-10-CM | POA: Diagnosis present

## 2023-07-08 DIAGNOSIS — I82C11 Acute embolism and thrombosis of right internal jugular vein: Secondary | ICD-10-CM | POA: Diagnosis present

## 2023-07-08 DIAGNOSIS — E785 Hyperlipidemia, unspecified: Secondary | ICD-10-CM | POA: Diagnosis present

## 2023-07-08 DIAGNOSIS — Z1152 Encounter for screening for COVID-19: Secondary | ICD-10-CM | POA: Diagnosis not present

## 2023-07-08 DIAGNOSIS — I509 Heart failure, unspecified: Secondary | ICD-10-CM

## 2023-07-08 DIAGNOSIS — F419 Anxiety disorder, unspecified: Secondary | ICD-10-CM

## 2023-07-08 DIAGNOSIS — I2609 Other pulmonary embolism with acute cor pulmonale: Secondary | ICD-10-CM | POA: Diagnosis not present

## 2023-07-08 DIAGNOSIS — J918 Pleural effusion in other conditions classified elsewhere: Secondary | ICD-10-CM

## 2023-07-08 DIAGNOSIS — J90 Pleural effusion, not elsewhere classified: Secondary | ICD-10-CM | POA: Diagnosis not present

## 2023-07-08 DIAGNOSIS — J189 Pneumonia, unspecified organism: Secondary | ICD-10-CM | POA: Diagnosis not present

## 2023-07-08 DIAGNOSIS — E871 Hypo-osmolality and hyponatremia: Secondary | ICD-10-CM | POA: Diagnosis not present

## 2023-07-08 DIAGNOSIS — I513 Intracardiac thrombosis, not elsewhere classified: Secondary | ICD-10-CM | POA: Diagnosis present

## 2023-07-08 DIAGNOSIS — I50814 Right heart failure due to left heart failure: Secondary | ICD-10-CM | POA: Diagnosis not present

## 2023-07-08 DIAGNOSIS — Z79899 Other long term (current) drug therapy: Secondary | ICD-10-CM | POA: Diagnosis not present

## 2023-07-08 DIAGNOSIS — I82611 Acute embolism and thrombosis of superficial veins of right upper extremity: Secondary | ICD-10-CM | POA: Diagnosis present

## 2023-07-08 DIAGNOSIS — E8809 Other disorders of plasma-protein metabolism, not elsewhere classified: Secondary | ICD-10-CM | POA: Diagnosis present

## 2023-07-08 DIAGNOSIS — F1510 Other stimulant abuse, uncomplicated: Secondary | ICD-10-CM | POA: Diagnosis present

## 2023-07-08 DIAGNOSIS — Z7901 Long term (current) use of anticoagulants: Secondary | ICD-10-CM | POA: Diagnosis not present

## 2023-07-08 DIAGNOSIS — I82811 Embolism and thrombosis of superficial veins of right lower extremities: Secondary | ICD-10-CM | POA: Diagnosis present

## 2023-07-08 DIAGNOSIS — I5082 Biventricular heart failure: Secondary | ICD-10-CM | POA: Diagnosis present

## 2023-07-08 DIAGNOSIS — F1910 Other psychoactive substance abuse, uncomplicated: Secondary | ICD-10-CM

## 2023-07-08 DIAGNOSIS — I5041 Acute combined systolic (congestive) and diastolic (congestive) heart failure: Secondary | ICD-10-CM | POA: Diagnosis present

## 2023-07-08 DIAGNOSIS — Z8679 Personal history of other diseases of the circulatory system: Secondary | ICD-10-CM | POA: Diagnosis not present

## 2023-07-08 DIAGNOSIS — Z87820 Personal history of traumatic brain injury: Secondary | ICD-10-CM | POA: Diagnosis not present

## 2023-07-08 DIAGNOSIS — R609 Edema, unspecified: Secondary | ICD-10-CM | POA: Diagnosis not present

## 2023-07-08 DIAGNOSIS — I5021 Acute systolic (congestive) heart failure: Secondary | ICD-10-CM | POA: Diagnosis not present

## 2023-07-08 DIAGNOSIS — R0602 Shortness of breath: Secondary | ICD-10-CM | POA: Diagnosis present

## 2023-07-08 DIAGNOSIS — Z8249 Family history of ischemic heart disease and other diseases of the circulatory system: Secondary | ICD-10-CM | POA: Diagnosis not present

## 2023-07-08 DIAGNOSIS — J180 Bronchopneumonia, unspecified organism: Secondary | ICD-10-CM | POA: Diagnosis present

## 2023-07-08 DIAGNOSIS — I428 Other cardiomyopathies: Secondary | ICD-10-CM | POA: Diagnosis present

## 2023-07-08 DIAGNOSIS — I5031 Acute diastolic (congestive) heart failure: Secondary | ICD-10-CM | POA: Diagnosis not present

## 2023-07-08 DIAGNOSIS — I2699 Other pulmonary embolism without acute cor pulmonale: Secondary | ICD-10-CM | POA: Diagnosis not present

## 2023-07-08 DIAGNOSIS — I429 Cardiomyopathy, unspecified: Secondary | ICD-10-CM | POA: Diagnosis not present

## 2023-07-08 DIAGNOSIS — M7989 Other specified soft tissue disorders: Secondary | ICD-10-CM | POA: Diagnosis not present

## 2023-07-08 DIAGNOSIS — N179 Acute kidney failure, unspecified: Secondary | ICD-10-CM | POA: Diagnosis not present

## 2023-07-08 DIAGNOSIS — I2693 Single subsegmental pulmonary embolism without acute cor pulmonale: Secondary | ICD-10-CM | POA: Diagnosis present

## 2023-07-08 DIAGNOSIS — R0600 Dyspnea, unspecified: Secondary | ICD-10-CM | POA: Diagnosis not present

## 2023-07-08 DIAGNOSIS — R042 Hemoptysis: Secondary | ICD-10-CM | POA: Diagnosis present

## 2023-07-08 DIAGNOSIS — J9601 Acute respiratory failure with hypoxia: Secondary | ICD-10-CM | POA: Diagnosis present

## 2023-07-08 LAB — MAGNESIUM: Magnesium: 1.9 mg/dL (ref 1.7–2.4)

## 2023-07-08 LAB — COMPREHENSIVE METABOLIC PANEL
ALT: 31 U/L (ref 0–44)
AST: 32 U/L (ref 15–41)
Albumin: 2.4 g/dL — ABNORMAL LOW (ref 3.5–5.0)
Alkaline Phosphatase: 72 U/L (ref 38–126)
Anion gap: 15 (ref 5–15)
BUN: 13 mg/dL (ref 6–20)
CO2: 20 mmol/L — ABNORMAL LOW (ref 22–32)
Calcium: 8.7 mg/dL — ABNORMAL LOW (ref 8.9–10.3)
Chloride: 102 mmol/L (ref 98–111)
Creatinine, Ser: 0.98 mg/dL (ref 0.61–1.24)
GFR, Estimated: >60 mL/min (ref >60)
Glucose, Bld: 118 mg/dL — ABNORMAL HIGH (ref 70–99)
Potassium: 4.3 mmol/L (ref 3.5–5.1)
Sodium: 137 mmol/L (ref 135–145)
Total Bilirubin: 1.7 mg/dL — ABNORMAL HIGH (ref 0.0–1.2)
Total Protein: 6.5 g/dL (ref 6.5–8.1)

## 2023-07-08 LAB — CBC WITH DIFFERENTIAL/PLATELET
Abs Immature Granulocytes: 0.03 10*3/uL (ref 0.00–0.07)
Basophils Absolute: 0 10*3/uL (ref 0.0–0.1)
Basophils Relative: 0 %
Eosinophils Absolute: 0.1 10*3/uL (ref 0.0–0.5)
Eosinophils Relative: 1 %
HCT: 36.8 % — ABNORMAL LOW (ref 39.0–52.0)
Hemoglobin: 11.9 g/dL — ABNORMAL LOW (ref 13.0–17.0)
Immature Granulocytes: 0 %
Lymphocytes Relative: 20 %
Lymphs Abs: 1.5 10*3/uL (ref 0.7–4.0)
MCH: 32.6 pg (ref 26.0–34.0)
MCHC: 32.3 g/dL (ref 30.0–36.0)
MCV: 100.8 fL — ABNORMAL HIGH (ref 80.0–100.0)
Monocytes Absolute: 0.5 10*3/uL (ref 0.1–1.0)
Monocytes Relative: 6 %
Neutro Abs: 5.4 10*3/uL (ref 1.7–7.7)
Neutrophils Relative %: 73 %
Platelets: 410 10*3/uL — ABNORMAL HIGH (ref 150–400)
RBC: 3.65 MIL/uL — ABNORMAL LOW (ref 4.22–5.81)
RDW: 13.8 % (ref 11.5–15.5)
WBC: 7.4 10*3/uL (ref 4.0–10.5)
nRBC: 0 % (ref 0.0–0.2)

## 2023-07-08 LAB — URINALYSIS, COMPLETE (UACMP) WITH MICROSCOPIC
Bacteria, UA: NONE SEEN
Bilirubin Urine: NEGATIVE
Glucose, UA: NEGATIVE mg/dL
Hgb urine dipstick: NEGATIVE
Ketones, ur: NEGATIVE mg/dL
Leukocytes,Ua: NEGATIVE
Nitrite: NEGATIVE
Protein, ur: NEGATIVE mg/dL
Specific Gravity, Urine: 1.004 — ABNORMAL LOW (ref 1.005–1.030)
pH: 7 (ref 5.0–8.0)

## 2023-07-08 LAB — TROPONIN I (HIGH SENSITIVITY): Troponin I (High Sensitivity): 21 ng/L — ABNORMAL HIGH (ref <18)

## 2023-07-08 LAB — RAPID URINE DRUG SCREEN, HOSP PERFORMED
Amphetamines: NOT DETECTED
Barbiturates: NOT DETECTED
Benzodiazepines: NOT DETECTED
Cocaine: NOT DETECTED
Opiates: NOT DETECTED
Tetrahydrocannabinol: POSITIVE — AB

## 2023-07-08 LAB — TSH: TSH: 1.409 u[IU]/mL (ref 0.350–4.500)

## 2023-07-08 LAB — LACTIC ACID, PLASMA: Lactic Acid, Venous: 1.5 mmol/L (ref 0.5–1.9)

## 2023-07-08 LAB — PROCALCITONIN: Procalcitonin: 0.11 ng/mL

## 2023-07-08 LAB — T4, FREE: Free T4: 1.52 ng/dL — ABNORMAL HIGH (ref 0.61–1.12)

## 2023-07-08 LAB — RESP PANEL BY RT-PCR (RSV, FLU A&B, COVID)  RVPGX2
Influenza A by PCR: NEGATIVE
Influenza B by PCR: NEGATIVE
Resp Syncytial Virus by PCR: NEGATIVE
SARS Coronavirus 2 by RT PCR: NEGATIVE

## 2023-07-08 LAB — PHOSPHORUS: Phosphorus: 3.2 mg/dL (ref 2.5–4.6)

## 2023-07-08 LAB — STREP PNEUMONIAE URINARY ANTIGEN: Strep Pneumo Urinary Antigen: NEGATIVE

## 2023-07-08 MED ORDER — SODIUM CHLORIDE 0.9 % IV SOLN
2.0000 g | Freq: Once | INTRAVENOUS | Status: AC
  Administered 2023-07-08: 2 g via INTRAVENOUS
  Filled 2023-07-08: qty 20

## 2023-07-08 MED ORDER — SODIUM CHLORIDE 0.9 % IV SOLN
500.0000 mg | Freq: Once | INTRAVENOUS | Status: AC
  Administered 2023-07-08: 500 mg via INTRAVENOUS
  Filled 2023-07-08: qty 5

## 2023-07-08 MED ORDER — IOHEXOL 350 MG/ML SOLN
75.0000 mL | Freq: Once | INTRAVENOUS | Status: AC | PRN
Start: 2023-07-08 — End: 2023-07-08
  Administered 2023-07-08: 75 mL via INTRAVENOUS

## 2023-07-08 MED ORDER — ACETAMINOPHEN 325 MG PO TABS
650.0000 mg | ORAL_TABLET | Freq: Four times a day (QID) | ORAL | Status: DC | PRN
  Administered 2023-07-16: 650 mg via ORAL
  Filled 2023-07-08 (×3): qty 2

## 2023-07-08 MED ORDER — FUROSEMIDE 10 MG/ML IJ SOLN
40.0000 mg | Freq: Two times a day (BID) | INTRAMUSCULAR | Status: DC
  Administered 2023-07-08 – 2023-07-12 (×9): 40 mg via INTRAVENOUS
  Filled 2023-07-08 (×9): qty 4

## 2023-07-08 MED ORDER — SODIUM CHLORIDE 0.9 % IV SOLN
500.0000 mg | INTRAVENOUS | Status: AC
  Administered 2023-07-08 – 2023-07-12 (×5): 500 mg via INTRAVENOUS
  Filled 2023-07-08 (×5): qty 5

## 2023-07-08 MED ORDER — ONDANSETRON HCL 4 MG/2ML IJ SOLN
4.0000 mg | Freq: Four times a day (QID) | INTRAMUSCULAR | Status: DC | PRN
  Administered 2023-07-08 – 2023-07-15 (×3): 4 mg via INTRAVENOUS
  Filled 2023-07-08 (×3): qty 2

## 2023-07-08 MED ORDER — HYDROCODONE BIT-HOMATROP MBR 5-1.5 MG/5ML PO SOLN
5.0000 mL | ORAL | Status: DC | PRN
  Administered 2023-07-08 – 2023-07-18 (×17): 5 mL via ORAL
  Filled 2023-07-08 (×18): qty 5

## 2023-07-08 MED ORDER — LORAZEPAM 2 MG/ML IJ SOLN
2.0000 mg | Freq: Once | INTRAMUSCULAR | Status: DC
  Filled 2023-07-08: qty 1

## 2023-07-08 MED ORDER — MELATONIN 3 MG PO TABS
3.0000 mg | ORAL_TABLET | Freq: Every evening | ORAL | Status: DC | PRN
  Administered 2023-07-14 – 2023-07-19 (×5): 3 mg via ORAL
  Filled 2023-07-08 (×5): qty 1

## 2023-07-08 MED ORDER — LORAZEPAM 2 MG/ML IJ SOLN
1.0000 mg | Freq: Once | INTRAMUSCULAR | Status: AC
  Administered 2023-07-08: 1 mg via INTRAVENOUS
  Filled 2023-07-08: qty 1

## 2023-07-08 MED ORDER — ALPRAZOLAM 0.25 MG PO TABS
0.5000 mg | ORAL_TABLET | Freq: Once | ORAL | Status: AC
  Administered 2023-07-08: 0.5 mg via ORAL
  Filled 2023-07-08: qty 2

## 2023-07-08 MED ORDER — HALOPERIDOL LACTATE 5 MG/ML IJ SOLN: 10.0000 mg | Freq: Once | INTRAMUSCULAR | Status: DC | PRN

## 2023-07-08 MED ORDER — FUROSEMIDE 10 MG/ML IJ SOLN
40.0000 mg | Freq: Once | INTRAMUSCULAR | Status: AC
  Administered 2023-07-08: 40 mg via INTRAVENOUS
  Filled 2023-07-08: qty 4

## 2023-07-08 MED ORDER — ENSURE ENLIVE PO LIQD
237.0000 mL | Freq: Three times a day (TID) | ORAL | Status: DC
  Administered 2023-07-09 – 2023-07-20 (×26): 237 mL via ORAL
  Filled 2023-07-08 (×2): qty 237

## 2023-07-08 MED ORDER — SODIUM CHLORIDE 0.9 % IV SOLN
2.0000 g | INTRAVENOUS | Status: DC
  Administered 2023-07-08 – 2023-07-11 (×4): 2 g via INTRAVENOUS
  Filled 2023-07-08 (×4): qty 20

## 2023-07-08 MED ORDER — ACETAMINOPHEN 650 MG RE SUPP: 650.0000 mg | Freq: Four times a day (QID) | RECTAL | Status: DC | PRN

## 2023-07-08 NOTE — ED Notes (Signed)
Patient refusing rectal temp at this time. Provider made aware

## 2023-07-08 NOTE — Progress Notes (Signed)
  Carryover admission to the Day Admitter.  I discussed this case with the EDP, Dr. Clayborne Dana.  Per these discussions:   This is a 53 year old male with history of TBI, recurrent meth abuse, who has been admitted with right-sided pleural effusion and potential new diagnosis of CHF.    In the setting of the patient's history of TBI, history provided by the patient and also his brother.   Patient has been standing present 1 month of progressive shortness of breath, dyspnea on exertion.  He initially presented to urgent care with these complaints in January on 2 different occasions, was told at that time that he would benefit from optimization of his blood pressure.  When he presented back to urgent care on 830 occasion in January 2025, he was diagnosed with pneumonia and subsequently admitted to [facility].  CTA of the chest at that time was suggestive of right lower lobe consolidation concerning for pneumonia for which he was started on IV antibiotics.  However, antiseptically (of hospital AMA and was prescribed Levaquin at the time that he left AMA.  Mother conveys that the patient is continue to engage in meth use following leaving the hospital AMA, and continue to experience the above shortness breath, dyspnea exertion, cough.  However, he now also has edema in the bilateral lower extremities. which is new for him no known history of underlying heart failure.  He presents to [facility] emergency department this evening for further evaluation management of the above.  He is afebrile, and is maintaining oxygen saturations in the mid to high 90s on room air, but is tachypneic, tachycardic.  D-dimer was elevated, prompting CTA chest which showed interval increase in size of right lower lobe pleural effusion as well as small left pleural effusion the absence of evidence of acute pulmonary embolism.  BNP 1500.  High sensitive troponin I initially 26 with repeat value trending down to 21.  CBC reflects  no leukocytosis.  Subsequently, accepted the patient for admission for further evaluation management of symptomatic right-sided pleural effusion as well as concern for potential new diagnosis of CHF.  He received a dose of IV Lasix in the ED. however, in the context of being afebrile, with no leukocytosis, no antibiotics were initiated.  I have placed an order for inpatient admission for further evaluation management of the above.  I have placed some additional preliminary admit orders via the adult multi-morbid admission order set. I have also ordered procalcitonin level, additional Lasix 40 mg IV twice daily, with next dose to occur around 11 AM this morning.  Also ordered echocardiogram for the morning as well as morning labs that include CMP, CBC, magnesium and phosphorus levels.  I will defer orders for potential diagnostic/therapeutic right-sided thoracentesis to the admitting hospitalist.    Newton Pigg, DO Hospitalist
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
## 2023-07-08 NOTE — ED Notes (Signed)
Patient refusing to stay on monitor. Patient ambulatory to and from restroom

## 2023-07-08 NOTE — ED Provider Notes (Signed)
 [place] [facility] AT [facility] Provider Note   CSN: [id number] Arrival date & time: 07/07/23  2057     History  Chief Complaint  Patient presents with   Shortness of Breath    Anthony Singleton is a 53 y.o. male.  53 year old male who presents ER today with progressively worsening dyspnea on exertion, chest pain on exertion, chills, body aches and night sweats.  Patient states that he was having issues at work in the month of January.  They progressively got worse and he had been back and forth between the doctor in the hospital.  Ultimately was diagnosed with pneumonia at the end of January but left AMA secondary to confusion secondary to the sickness and his remote traumatic brain injury.  He was called in prescription of Levaquin which she finished a few days ago.  During the whole time he did have some improvement in his symptoms but the last 3 days he got worse again.  He started having bloody sputum but not frank mopped assist.  Lower extremity edema.  Worsening dyspnea.  Has a long history of significant amount of marijuana usage.  Stated he would smoke marijuana pretty much all day up until about 4 months ago.  Also has a long history of methamphetamine abuse but has not used that since his hospitalization in January.  Does not smoke and has never smoked cigarettes.  Is not a chronic alcohol user.  He does have some agitation issues related to his TBI no other significant medical history.  No known medical problems but does have strong family history for heart issues.  During his hospitalization in January he did receive a PET scan that showed consolidations consistent with likely pneumonia and effusion associated with that as well.  I cannot access any of his other records from the hospitalization and just basing off of what him and his brother Orvilla Fus at bedside.   Shortness of Breath      Home Medications Prior to Admission medications   Medication  Sig Start Date End Date Taking? Authorizing Provider  diclofenac (VOLTAREN) 50 MG EC tablet TAKE 1 TABLET BY MOUTH TWICE DAILY, WITH A MEAL Patient not taking: Reported on 01/19/2019 11/03/17   Ranelle Oyster, MD  divalproex (DEPAKOTE) 250 MG DR tablet TAKE 1 TABLET(250 MG) BY MOUTH TWICE DAILY Patient not taking: Reported on 01/19/2019 05/24/18   Ranelle Oyster, MD  escitalopram (LEXAPRO) 5 MG tablet Take 1 tablet (5 mg total) by mouth daily. 01/19/19   Ranelle Oyster, MD  NON FORMULARY CBD oil    [provider]  omeprazole (PRILOSEC OTC) 20 MG tablet Take 20 mg by mouth daily.    [provider]  QUEtiapine (SEROQUEL) 50 MG tablet TAKE 1 TABLET(50 MG) BY MOUTH AT BEDTIME 01/19/19   Ranelle Oyster, MD  sildenafil (VIAGRA) 50 MG tablet Take 1 tablet (50 mg total) by mouth daily as needed for erectile dysfunction. Patient not taking: Reported on 01/19/2019 01/20/18   Ranelle Oyster, MD      Allergies    Patient has no known allergies.    Review of Systems   Review of Systems  Respiratory:  Positive for shortness of breath.     Physical Exam Updated Vital Signs BP 127/89   Pulse (!) 129   Temp 98.8 F (37.1 C)   Resp (!) 29   Ht 6\' 6"  (1.981 m)   Wt 106.6 kg   SpO2 95%  BMI 27.16 kg/m  Physical Exam Vitals and nursing note reviewed.  Constitutional:      General: He is in acute distress.     Appearance: He is well-developed.  HENT:     Head: Normocephalic and atraumatic.  Cardiovascular:     Rate and Rhythm: Tachycardia present.  Pulmonary:     Effort: Tachypnea and accessory muscle usage present. No respiratory distress.     Breath sounds: Decreased breath sounds (Mostly in the right lower lung field) and wheezing present.  Chest:     Chest wall: No tenderness.  Abdominal:     General: There is no distension.  Musculoskeletal:        General: Normal range of motion.     Cervical back: Normal range of motion.     Right lower leg: Edema  present.     Left lower leg: Edema (mijnimal around ankles) present.  Skin:    General: Skin is warm and dry.  Neurological:     General: No focal deficit present.     Mental Status: He is alert.     ED Results / Procedures / Treatments   Labs (all labs ordered are listed, but only abnormal results are displayed) Labs Reviewed  BASIC METABOLIC PANEL - Abnormal; Notable for the following components:      Result Value   CO2 20 (*)    Calcium 8.5 (*)    All other components within normal limits  CBC - Abnormal; Notable for the following components:   RBC 3.90 (*)    Hemoglobin 12.7 (*)    MCV 101.3 (*)    Platelets 448 (*)    All other components within normal limits  D-DIMER, QUANTITATIVE - Abnormal; Notable for the following components:   D-Dimer, Quant 18.01 (*)    All other components within normal limits  BRAIN NATRIURETIC PEPTIDE - Abnormal; Notable for the following components:   B Natriuretic Peptide 1,504.8 (*)    All other components within normal limits  T4, FREE - Abnormal; Notable for the following components:   Free T4 1.52 (*)    All other components within normal limits  CBC WITH DIFFERENTIAL/PLATELET - Abnormal; Notable for the following components:   RBC 3.65 (*)    Hemoglobin 11.9 (*)    HCT 36.8 (*)    MCV 100.8 (*)    Platelets 410 (*)    All other components within normal limits  COMPREHENSIVE METABOLIC PANEL - Abnormal; Notable for the following components:   CO2 20 (*)    Glucose, Bld 118 (*)    Calcium 8.7 (*)    Albumin 2.4 (*)    Total Bilirubin 1.7 (*)    All other components within normal limits  TROPONIN I (HIGH SENSITIVITY) - Abnormal; Notable for the following components:   Troponin I (High Sensitivity) 26 (*)    All other components within normal limits  TROPONIN I (HIGH SENSITIVITY) - Abnormal; Notable for the following components:   Troponin I (High Sensitivity) 21 (*)    All other components within normal limits  RESP PANEL BY  RT-PCR (RSV, FLU A&B, COVID)  RVPGX2  CULTURE, BLOOD (ROUTINE X 2)  CULTURE, BLOOD (ROUTINE X 2)  LACTIC ACID, PLASMA  TSH  MAGNESIUM  PHOSPHORUS  PROCALCITONIN    EKG EKG Interpretation Date/Time:  Tuesday July 07 2023 21:11:31 EST Ventricular Rate:  132 PR Interval:  172 QRS Duration:  94 QT Interval:  308 QTC Calculation: 456 R Axis:   54  Text Interpretation: Sinus tachycardia Nonspecific ST and T wave abnormality Abnormal ECG When compared with ECG of 27-May-2017 15:46, No significant change was found Confirmed by Dione Booze ([id number]) on 07/07/2023 11:49:58 PM  Radiology CT Angio Chest PE W and/or Wo Contrast Result Date: 07/08/2023 CLINICAL DATA:  Positive D-dimer. Shortness of breath. PE suspected. EXAM: CT ANGIOGRAPHY CHEST WITH CONTRAST TECHNIQUE: Multidetector CT imaging of the chest was performed using the standard protocol during bolus administration of intravenous contrast. Multiplanar CT image reconstructions and MIPs were obtained to evaluate the vascular anatomy. RADIATION DOSE REDUCTION: This exam was performed according to the departmental dose-optimization program which includes automated exposure control, adjustment of the mA and/or kV according to patient size and/or use of iterative reconstruction technique. CONTRAST:  75mL OMNIPAQUE IOHEXOL 350 MG/ML SOLN COMPARISON:  CTA chest 06/23/2023 FINDINGS: Cardiovascular: Cardiomegaly. No pericardial effusion. No central pulmonary embolism. There is poor opacification of the right lower and middle lobe pulmonary arteries which is favored due to reduced cardiac output and mixing artifact. The majority of contrast remains in the right heart. Right lower lobe segmental and subsegmental pulmonary emboli can not be excluded. Elevated right heart pressures with reflux of contrast into the IVC and hepatic veins. Mediastinum/Nodes: Trachea and esophagus are unremarkable. Shotty mediastinal and hilar lymph nodes are favored reactive.  Lungs/Pleura: Large right pleural effusion, increased from 06/23/2023. Bronchial wall thickening and mucous plugging in the right lower and middle lobes. Ground-glass opacities and peribronchovascular consolidation in the right lower lobe and right middle lobe. Interlobular septal thickening in the right lower and middle lobes. These findings have progressed from 06/23/2023. The left lung is clear. No pneumothorax. Upper Abdomen: No acute abnormality. Musculoskeletal: No acute fracture. Review of the MIP images confirms the above findings. IMPRESSION: 1. No central pulmonary embolism. Poor opacification of the right lower and middle lobe pulmonary arteries is favored due to reduced cardiac output and mixing artifact. Right lower lobe segmental and subsegmental pulmonary emboli can not be excluded. 2. Right lower and middle lobe bronchopneumonia increased from 06/23/2023. 3. Large right pleural effusion, increased from 06/23/2023. 4. Cardiomegaly with elevated right heart pressures. Electronically Signed   By: Minerva Fester M.D.   On: 07/08/2023 02:36   DG Chest 2 View Result Date: 07/07/2023 CLINICAL DATA:  Shortness of breath. EXAM: CHEST - 2 VIEW COMPARISON:  June 23, 2023 FINDINGS: The cardiac silhouette is mildly enlarged and unchanged in size. Marked severity infiltrate is seen within the right lung base. This is increased in severity when compared to the prior study. Mild left basilar atelectasis and/or infiltrate is also noted. There is a small right pleural effusion. No pneumothorax is identified. The visualized skeletal structures are unremarkable. IMPRESSION: 1. Marked severity right basilar infiltrate, increased in severity when compared to the prior study. 2. Mild left basilar atelectasis and/or infiltrate. 3. Small right pleural effusion. Electronically Signed   By: Aram Candela M.D.   On: 07/07/2023 22:18    Procedures .Critical Care  Performed by: Marily Memos, MD Authorized by:  Marily Memos, MD   Critical care provider statement:    Critical care time (minutes):  30   Critical care was necessary to treat or prevent imminent or life-threatening deterioration of the following conditions:  Respiratory failure, sepsis, cardiac failure and circulatory failure   Critical care was time spent personally by me on the following activities:  Development of treatment plan with patient or surrogate, discussions with consultants, evaluation of patient's response to treatment, examination of patient,  ordering and review of laboratory studies, ordering and review of radiographic studies, ordering and performing treatments and interventions, pulse oximetry, re-evaluation of patient's condition and review of old charts     Medications Ordered in ED Medications  HYDROcodone bit-homatropine (HYCODAN) 5-1.5 MG/5ML syrup 5 mL (has no administration in time range)  acetaminophen (TYLENOL) tablet 650 mg (has no administration in time range)    Or  acetaminophen (TYLENOL) suppository 650 mg (has no administration in time range)  melatonin tablet 3 mg (has no administration in time range)  ondansetron (ZOFRAN) injection 4 mg (has no administration in time range)  furosemide (LASIX) injection 40 mg (has no administration in time range)  LORazepam (ATIVAN) injection 2 mg (has no administration in time range)  haloperidol lactate (HALDOL) injection 10 mg (has no administration in time range)  ALPRAZolam (XANAX) tablet 0.5 mg (0.5 mg Oral Given 07/08/23 0237)  cefTRIAXone (ROCEPHIN) 2 g in sodium chloride 0.9 % 100 mL IVPB (0 g Intravenous Stopped 07/08/23 0303)  azithromycin (ZITHROMAX) 500 mg in sodium chloride 0.9 % 250 mL IVPB (0 mg Intravenous Stopped 07/08/23 0453)  iohexol (OMNIPAQUE) 350 MG/ML injection 75 mL (75 mLs Intravenous Contrast Given 07/08/23 0219)  LORazepam (ATIVAN) injection 1 mg (1 mg Intravenous Given 07/08/23 0350)  furosemide (LASIX) injection 40 mg (40 mg Intravenous Given  07/08/23 0350)    ED Course/ Medical Decision Making/ A&P                                 Medical Decision Making Amount and/or Complexity of Data Reviewed Labs: ordered. Radiology: ordered.  Risk Prescription drug management. Decision regarding hospitalization.  I can only reviewed patient's x-ray and CT scan from [place] and his x-ray today is seem worse than then.  His D-dimer is elevated so we will need to have repeat CT scan as well although think PE is unlikely.  I suspect either he has unresolved pneumonia or more likely new onset heart failure secondary to methamphetamine use.  Will add on a TSH just to be sure.  His troponins and BNP are little elevated and he is pretty tachycardic so we will need to check a rectal temperature just to ensure all that is not related to a fever.  Will empirically start antibiotics secondary to the history of pneumonia that initially improved with antibiotics.  The blood and sputum looks to be more blood-tinged than true hemoptysis likely related to what ever this pleural effusion is.  Also consider possible cancer with the night sweats and his long history of marijuana use so we will see what CT says compared to the other 1 although likely will not be definitive.  Patient will need to be admitted to hospital for work of breathing, further workup anyway. Oxygen started for wob, not hypoxic.  Patient states he gets agitated related to TBI and that is why he left AMA last time. Prophylactically gave him some xanax as that has helped in the past.  Patient with progressive agitation, ativan/haldol ordered. D/w Dr. Arlean Hopping for admission for heart failure, lasix ordered. May need thoracentesis to evaluate the pleural fluid.     Final Clinical Impression(s) / ED Diagnoses Final diagnoses:  Dyspnea, unspecified type  Community acquired pneumonia, unspecified laterality  Congestive heart failure, unspecified HF chronicity, unspecified heart failure type (HCC)     Rx / DC Orders ED Discharge Orders     None  Ayomide Purdy, Barbara Cower, MD 07/08/23 [id number]

## 2023-07-08 NOTE — ED Notes (Signed)
Patient unable to collect urine sample at this time. Patient states that it is "near impossible" for him to provide urine samples due to the fact that he urinates and defecates at the same time.

## 2023-07-08 NOTE — Consult Note (Signed)
NAME:  Anthony Singleton, MRN:  [id number], DOB:  25-Apr-1971, LOS: 0 ADMISSION DATE:  07/07/2023, CONSULTATION DATE:  07/08/23 REFERRING MD:  Newton Pigg, CHIEF COMPLAINT:  dyspnea   History of Present Illness:  53 year old male with remote traumatic brain injury, marijuana use, methamphetamine use admitted with PNA and large right pleural effusion.   Patient presented to the ED today with shortness of breath x 10 days.  Recent hospitalization for PNA at [facility] but left AMA.  He did complete a course of Augmentin.  VS on presentation: Temp 98.4, RR 26, HR 133, BP: 141/112., SpO2 100% on room air.  A CTA Chest was completed and shoed a large right pleural effusion, increased from 06/23/2023. Bronchial wall thickening and mucous plugging in the right lower and middle lobes. Ground-glass opacities and peribronchovascular consolidation in the right lower lobe and right middle lobe. Interlobular septal thickening in the right lower and middle lobes. His initial symptoms started early in January. He was ultimately diagnosed with pneumonia and started on IV antibiotics at [facility]. He left AMA.   Since leaving [facility] AMA, patient has continued to have worsening dyspnea but oxygenating well. He states he has intermittent lower extremity edema, but feels they are overall improved today. He also endorses right pleuritic pain.    Pertinent  Medical History  Traumatic brain use Polysubstance use: Marijuana, methamphetamine  Anxiety disorder, no medication   Significant Hospital Events: Including procedures, antibiotic start and stop dates in addition to other pertinent events   2/11 Presented to ED   Interim History / Subjective:  New consult  Objective   Blood pressure 106/79, pulse (!) 124, temperature 98.8 F (37.1 C), temperature source Oral, resp. rate (!) 24, height 6\' 6"  (1.981 m), weight 106.6 kg, SpO2 97%.       No intake or output data in the 24 hours ending  07/08/23 1027 Filed Weights   07/07/23 2129  Weight: 106.6 kg    Examination: General: Adult male resting in bed, no distress HENT: Ada/AT Lungs: Right lung diminished at bases, clear in upper lobes, no wheezing Cardiovascular: ST on telemetry, S1/S2, trace edema to lower extremities, warm and well perfused  Abdomen: Soft, non tender, non distended Extremities: No deformities Neuro: Alert and oriented, slurred speech, moving all extremities equally GU: Deferred   Resolved Hospital Problem list     Assessment & Plan:  Right pleural effusion  Community acquired pneumonia of right middle and lower lobe -- Concern for parapneumonic pleural effusion -- Recent completion of abx with Augment P: -- Strep pneumonia, legionella, sputum culture pending.  -- Abx: Azithro and Ceftriaxone -- Discussed pigtail catheter placement for right pleural effusion with patient, he is agreeable. He is not on any antiplatelet/anticoagulation.  Plan 1400 tomorrow in Endoscopy.  Pleural fluid labs in sign and held orders.  Cardiomegaly on CTA chest -- Last TTE in 2019 with normal LVEF.  Mildly thickened mitral valve leaflets with mobile ventricular surface echodensity on anterior leaflet  P:  -- TTE pending -- Diuresis per primary team   Labs   CBC: Recent Labs  Lab 07/07/23 2144 07/08/23 0154  WBC 7.8 7.4  NEUTROABS  --  5.4  HGB 12.7* 11.9*  HCT 39.5 36.8*  MCV 101.3* 100.8*  PLT 448* 410*    Basic Metabolic Panel: Recent Labs  Lab 07/07/23 2144 07/08/23 0154  NA 136 137  K 4.2 4.3  CL 103 102  CO2 20* 20*  GLUCOSE 84  118*  BUN 13 13  CREATININE 1.07 0.98  CALCIUM 8.5* 8.7*  MG  --  1.9  PHOS  --  3.2   GFR: Estimated Creatinine Clearance: 116.6 mL/min (by C-G formula based on SCr of 0.98 mg/dL). Recent Labs  Lab 07/07/23 2144 07/08/23 0154  PROCALCITON  --  0.11  WBC 7.8 7.4  LATICACIDVEN  --  1.5    Liver Function Tests: Recent Labs  Lab 07/08/23 0154  AST 32   ALT 31  ALKPHOS 72  BILITOT 1.7*  PROT 6.5  ALBUMIN 2.4*   No results for input(s): "LIPASE", "AMYLASE" in the last 168 hours. No results for input(s): "AMMONIA" in the last 168 hours.  ABG    Component Value Date/Time   PHART 7.225 (L) 05/09/2017 1528   PCO2ART 52.4 (H) 05/09/2017 1528   PO2ART 50.0 (L) 05/09/2017 1528   HCO3 21.7 05/09/2017 1528   TCO2 23 05/09/2017 1528   ACIDBASEDEF 6.0 (H) 05/09/2017 1528   O2SAT 77.0 05/09/2017 1528     Coagulation Profile: No results for input(s): "INR", "PROTIME" in the last 168 hours.  Cardiac Enzymes: No results for input(s): "CKTOTAL", "CKMB", "CKMBINDEX", "TROPONINI" in the last 168 hours.  HbA1C: No results found for: "HGBA1C"  CBG: No results for input(s): "GLUCAP" in the last 168 hours.  Review of Systems:   Per HPI  Past Medical History:  He,  has a past medical history of Anxiety disorder, Gilbert's syndrome, Hearing loss of left ear, and Tinnitus, left.   Surgical History:   Past Surgical History:  Procedure Laterality Date   APPENDECTOMY     when he was youmg, per wife     Social History:   reports that he has never smoked. He has never used smokeless tobacco. He reports current drug use. Drugs: Amphetamines and Marijuana. He reports that he does not drink alcohol.   Family History:  His family history includes Stroke in his father and mother.   Allergies No Known Allergies   Home Medications  Prior to Admission medications   Medication Sig Start Date End Date Taking? Authorizing Provider  levofloxacin (LEVAQUIN) 750 MG tablet Take 750 mg by mouth daily. 06/24/23  Yes [provider]  diclofenac (VOLTAREN) 50 MG EC tablet TAKE 1 TABLET BY MOUTH TWICE DAILY, WITH A MEAL Patient not taking: Reported on 01/19/2019 11/03/17   Ranelle Oyster, MD  divalproex (DEPAKOTE) 250 MG DR tablet TAKE 1 TABLET(250 MG) BY MOUTH TWICE DAILY Patient not taking: Reported on 01/19/2019 05/24/18   Ranelle Oyster, MD  escitalopram (LEXAPRO) 5 MG tablet Take 1 tablet (5 mg total) by mouth daily. 01/19/19   Ranelle Oyster, MD  NON FORMULARY CBD oil    [provider]  omeprazole (PRILOSEC OTC) 20 MG tablet Take 20 mg by mouth daily.    [provider]  QUEtiapine (SEROQUEL) 50 MG tablet TAKE 1 TABLET(50 MG) BY MOUTH AT BEDTIME 01/19/19   Ranelle Oyster, MD  sildenafil (VIAGRA) 50 MG tablet Take 1 tablet (50 mg total) by mouth daily as needed for erectile dysfunction. Patient not taking: Reported on 01/19/2019 01/20/18   Ranelle Oyster, MD   Patient states he takes no home medications

## 2023-07-08 NOTE — ED Notes (Addendum)
Patient removed IV and pooped on floor and refuses to let RN start new IV. Provider notified. Dr Arlean Hopping states not to start new IV at this time

## 2023-07-09 ENCOUNTER — Encounter (HOSPITAL_COMMUNITY)
Admission: EM | Disposition: A | Discharge status: Home / Self Care | Payer: Self-pay | Source: Home / Self Care | Attending: Internal Medicine

## 2023-07-09 ENCOUNTER — Inpatient Hospital Stay (HOSPITAL_COMMUNITY): Payer: BC Managed Care – PPO

## 2023-07-09 DIAGNOSIS — R0600 Dyspnea, unspecified: Secondary | ICD-10-CM | POA: Diagnosis not present

## 2023-07-09 DIAGNOSIS — J189 Pneumonia, unspecified organism: Secondary | ICD-10-CM | POA: Diagnosis not present

## 2023-07-09 DIAGNOSIS — F419 Anxiety disorder, unspecified: Secondary | ICD-10-CM | POA: Diagnosis not present

## 2023-07-09 DIAGNOSIS — J90 Pleural effusion, not elsewhere classified: Secondary | ICD-10-CM | POA: Diagnosis not present

## 2023-07-09 LAB — URINE CULTURE: Culture: NO GROWTH

## 2023-07-09 LAB — CBC WITH DIFFERENTIAL/PLATELET
Abs Immature Granulocytes: 0.02 10*3/uL (ref 0.00–0.07)
Basophils Absolute: 0 10*3/uL (ref 0.0–0.1)
Basophils Relative: 0 %
Eosinophils Absolute: 0 10*3/uL (ref 0.0–0.5)
Eosinophils Relative: 1 %
HCT: 36.7 % — ABNORMAL LOW (ref 39.0–52.0)
Hemoglobin: 12.4 g/dL — ABNORMAL LOW (ref 13.0–17.0)
Immature Granulocytes: 0 %
Lymphocytes Relative: 15 %
Lymphs Abs: 1.2 10*3/uL (ref 0.7–4.0)
MCH: 33.1 pg (ref 26.0–34.0)
MCHC: 33.8 g/dL (ref 30.0–36.0)
MCV: 97.9 fL (ref 80.0–100.0)
Monocytes Absolute: 0.4 10*3/uL (ref 0.1–1.0)
Monocytes Relative: 5 %
Neutro Abs: 5.9 10*3/uL (ref 1.7–7.7)
Neutrophils Relative %: 79 %
Platelets: 336 10*3/uL (ref 150–400)
RBC: 3.75 MIL/uL — ABNORMAL LOW (ref 4.22–5.81)
RDW: 13.6 % (ref 11.5–15.5)
WBC: 7.5 10*3/uL (ref 4.0–10.5)
nRBC: 0 % (ref 0.0–0.2)

## 2023-07-09 LAB — LACTATE DEHYDROGENASE
LDH: 227 U/L — ABNORMAL HIGH (ref 98–192)
LDH: 218 U/L — ABNORMAL HIGH (ref 98–192)

## 2023-07-09 LAB — COMPREHENSIVE METABOLIC PANEL
ALT: 38 U/L (ref 0–44)
AST: 41 U/L (ref 15–41)
Albumin: 2.3 g/dL — ABNORMAL LOW (ref 3.5–5.0)
Alkaline Phosphatase: 83 U/L (ref 38–126)
Anion gap: 10 (ref 5–15)
BUN: 15 mg/dL (ref 6–20)
CO2: 23 mmol/L (ref 22–32)
Calcium: 8.1 mg/dL — ABNORMAL LOW (ref 8.9–10.3)
Chloride: 100 mmol/L (ref 98–111)
Creatinine, Ser: 1.32 mg/dL — ABNORMAL HIGH (ref 0.61–1.24)
GFR, Estimated: >60 mL/min (ref >60)
Glucose, Bld: 155 mg/dL — ABNORMAL HIGH (ref 70–99)
Potassium: 3.5 mmol/L (ref 3.5–5.1)
Sodium: 133 mmol/L — ABNORMAL LOW (ref 135–145)
Total Bilirubin: 1.2 mg/dL (ref 0.0–1.2)
Total Protein: 6.7 g/dL (ref 6.5–8.1)

## 2023-07-09 LAB — GLUCOSE, PLEURAL OR PERITONEAL FLUID: Glucose, Fluid: 98 mg/dL

## 2023-07-09 LAB — BODY FLUID CELL COUNT WITH DIFFERENTIAL
Eos, Fluid: 0 %
Lymphs, Fluid: 77 %
Monocyte-Macrophage-Serous Fluid: 2 % — ABNORMAL LOW (ref 50–90)
Neutrophil Count, Fluid: 20 % (ref 0–25)
Other Cells, Fluid: 1 %
Total Nucleated Cell Count, Fluid: 2850 uL — ABNORMAL HIGH (ref 0–1000)

## 2023-07-09 LAB — GRAM STAIN

## 2023-07-09 LAB — PROTEIN, PLEURAL OR PERITONEAL FLUID: Total protein, fluid: <3 g/dL

## 2023-07-09 LAB — BRAIN NATRIURETIC PEPTIDE: B Natriuretic Peptide: 1055.2 pg/mL — ABNORMAL HIGH (ref 0.0–100.0)

## 2023-07-09 LAB — MAGNESIUM: Magnesium: 1.8 mg/dL (ref 1.7–2.4)

## 2023-07-09 LAB — LACTATE DEHYDROGENASE, PLEURAL OR PERITONEAL FLUID: LD, Fluid: 277 U/L — ABNORMAL HIGH (ref 3–23)

## 2023-07-09 LAB — LEGIONELLA PNEUMOPHILA SEROGP 1 UR AG: L. pneumophila Serogp 1 Ur Ag: NEGATIVE

## 2023-07-09 LAB — ALBUMIN, PLEURAL OR PERITONEAL FLUID: Albumin, Fluid: <1.5 g/dL

## 2023-07-09 LAB — ALBUMIN: Albumin: 2.3 g/dL — ABNORMAL LOW (ref 3.5–5.0)

## 2023-07-09 SURGERY — CHEST TUBE INSERTION
Anesthesia: LOCAL

## 2023-07-09 MED ORDER — HYDROXYZINE HCL 25 MG PO TABS
25.0000 mg | ORAL_TABLET | Freq: Three times a day (TID) | ORAL | Status: DC | PRN
  Administered 2023-07-09 – 2023-07-16 (×6): 25 mg via ORAL
  Filled 2023-07-09 (×7): qty 1

## 2023-07-09 MED ORDER — HYDROMORPHONE HCL 1 MG/ML IJ SOLN
1.0000 mg | Freq: Once | INTRAMUSCULAR | Status: AC
  Administered 2023-07-09: 1 mg via INTRAVENOUS
  Filled 2023-07-09: qty 1

## 2023-07-09 MED ORDER — OXYCODONE HCL 5 MG PO TABS
5.0000 mg | ORAL_TABLET | Freq: Four times a day (QID) | ORAL | Status: DC | PRN
  Administered 2023-07-09 – 2023-07-10 (×3): 5 mg via ORAL
  Filled 2023-07-09 (×3): qty 1

## 2023-07-09 MED ORDER — SODIUM CHLORIDE 0.9% FLUSH
10.0000 mL | Freq: Three times a day (TID) | INTRAVENOUS | Status: DC
  Administered 2023-07-09 – 2023-07-20 (×24): 10 mL via INTRAPLEURAL

## 2023-07-09 MED ORDER — METOPROLOL TARTRATE 5 MG/5ML IV SOLN
5.0000 mg | Freq: Once | INTRAVENOUS | Status: AC
  Administered 2023-07-09: 5 mg via INTRAVENOUS
  Filled 2023-07-09: qty 5

## 2023-07-09 MED ORDER — LIDOCAINE HCL (PF) 1 % IJ SOLN: 30.0000 mL | Freq: Once | INTRAMUSCULAR | Status: AC

## 2023-07-09 MED ORDER — LIDOCAINE HCL (PF) 1 % IJ SOLN
INTRAMUSCULAR | Status: AC
Start: 2023-07-09 — End: 2023-07-09
  Administered 2023-07-09: 30 mL
  Filled 2023-07-09: qty 30

## 2023-07-09 NOTE — ED Notes (Signed)
Sent page to D. Janee Morn per [employer] to [employer] 

## 2023-07-09 NOTE — Progress Notes (Signed)
Attempted echo, but patient cannot have echo at this time since he is waiting to go upstairs to a room.

## 2023-07-09 NOTE — Plan of Care (Signed)
  Problem: Education: Goal: Knowledge of General Education information will improve Description: Including pain rating scale, medication(s)/side effects and non-pharmacologic comfort measures 07/09/2023 2318 by Isac Sarna, RN Outcome: Progressing 07/09/2023 2318 by Isac Sarna, RN Outcome: Progressing   Problem: Clinical Measurements: Goal: Ability to maintain clinical measurements within normal limits will improve 07/09/2023 2318 by Isac Sarna, RN Outcome: Progressing 07/09/2023 2318 by Isac Sarna, RN Outcome: Progressing Goal: Will remain free from infection 07/09/2023 2318 by Isac Sarna, RN Outcome: Progressing 07/09/2023 2318 by Isac Sarna, RN Outcome: Progressing Goal: Diagnostic test results will improve 07/09/2023 2318 by Isac Sarna, RN Outcome: Progressing 07/09/2023 2318 by Isac Sarna, RN Outcome: Progressing Goal: Respiratory complications will improve 07/09/2023 2318 by Isac Sarna, RN Outcome: Progressing 07/09/2023 2318 by Isac Sarna, RN Outcome: Progressing Goal: Cardiovascular complication will be avoided 07/09/2023 2318 by Isac Sarna, RN Outcome: Progressing 07/09/2023 2318 by Isac Sarna, RN Outcome: Progressing   Problem: Activity: Goal: Risk for activity intolerance will decrease 07/09/2023 2318 by Isac Sarna, RN Outcome: Progressing 07/09/2023 2318 by Isac Sarna, RN Outcome: Progressing   Problem: Coping: Goal: Level of anxiety will decrease 07/09/2023 2318 by Isac Sarna, RN Outcome: Progressing 07/09/2023 2318 by Isac Sarna, RN Outcome: Progressing   Problem: Pain Managment: Goal: General experience of comfort will improve and/or be controlled 07/09/2023 2318 by Isac Sarna, RN Outcome: Progressing 07/09/2023 2318 by Isac Sarna, RN Outcome: Progressing
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
## 2023-07-09 NOTE — ED Notes (Signed)
Rochel Brome, NP, aware of patient's output from chest tube. Pleurvac changed.

## 2023-07-09 NOTE — ED Notes (Signed)
Patient placed on 2L [place] for comfort.

## 2023-07-09 NOTE — Progress Notes (Signed)
NAME:  Anthony Singleton, MRN:  [id number], DOB:  December 01, 1970, LOS: 1 ADMISSION DATE:  07/07/2023, CONSULTATION DATE:  07/08/23 REFERRING MD:  Newton Pigg, CHIEF COMPLAINT:  dyspnea   History of Present Illness:  53 year old male with remote traumatic brain injury, marijuana use, methamphetamine use admitted with PNA and large right pleural effusion.   Patient presented to the ED today with shortness of breath x 10 days.  Recent hospitalization for PNA at [facility] but left AMA.  He did complete a course of Augmentin.  VS on presentation: Temp 98.4, RR 26, HR 133, BP: 141/112., SpO2 100% on room air.  A CTA Chest was completed and shoed a large right pleural effusion, increased from 06/23/2023. Bronchial wall thickening and mucous plugging in the right lower and middle lobes. Ground-glass opacities and peribronchovascular consolidation in the right lower lobe and right middle lobe. Interlobular septal thickening in the right lower and middle lobes. His initial symptoms started early in January. He was ultimately diagnosed with pneumonia and started on IV antibiotics at [facility]. He left AMA.   Since leaving [facility] AMA, patient has continued to have worsening dyspnea but oxygenating well. He states he has intermittent lower extremity edema, but feels they are overall improved today. He also endorses right pleuritic pain.    Pertinent  Medical History  Traumatic brain use Polysubstance use: Marijuana, methamphetamine  Anxiety disorder, no medication   Significant Hospital Events: Including procedures, antibiotic start and stop dates in addition to other pertinent events   - 2/11 Presented to ED - CTA chest 2/12>>  1. No central pulmonary embolism. Poor opacification of the right lower and middle lobe pulmonary arteries is favored due to reduced cardiac output and mixing artifact. Right lower lobe segmental and subsegmental pulmonary emboli can not be excluded. 2.  Right lower and middle lobe bronchopneumonia increased from 06/23/2023. 3. Large right pleural effusion, increased from 06/23/2023. 4. Cardiomegaly with elevated right heart pressures.  Interim History / Subjective:  No sig change, remains in ER. Feeling better. Comfortable on RA.   Objective   Blood pressure 130/83, pulse (!) 114, temperature 99.6 F (37.6 C), temperature source Oral, resp. rate 19, height 6\' 6"  (1.981 m), weight 106.6 kg, SpO2 97%.        Intake/Output Summary (Last 24 hours) at 07/09/2023 1105 Last data filed at 07/09/2023 1025 Gross per 24 hour  Intake 1566.08 ml  Output 1825 ml  Net -258.92 ml   Filed Weights   07/07/23 2129  Weight: 106.6 kg    Examination: General: wdwn male, NAD in bed HENT: [place]/AT Lungs: resps even non labored on RA, diminished R otherwise clear, no audible wheeze  Cardiovascular: s1s2 rrr  Abdomen: Soft, non tender, non distended Extremities: No deformities, no edema  Neuro: awake, alert, oriented  GU: Deferred   Resolved Hospital Problem list     Assessment & Plan:  Community acquired pneumonia of right middle and lower lobe with parapneumonic effusion  -- Concern for parapneumonic pleural effusion -- Recent completion of abx with Augment -- CTA chest NEG for PE -- resp status stable, dyspnea improved, on RA P: -- Strep pneumonia, legionella, sputum culture pending.  -- continue Abx: Azithro and Ceftriaxone -- Plan for pigtail catheter placement, can likely do at bedside in ER rather than waiting for endo room. Pleural fluid labs in sign and held orders. -- supplemental O2 if needed to maintain O2 sats >92%  Acute CHF exacerbation - improving -- Last  TTE in 2019 with normal LVEF.  Mildly thickened mitral valve leaflets with mobile ventricular surface echodensity on anterior leaflet  P:  -- TTE pending -- Diuresis per primary team   Labs   CBC: Recent Labs  Lab 07/07/23 2144 07/08/23 0154 07/09/23 0841  WBC 7.8  7.4 7.5  NEUTROABS  --  5.4 5.9  HGB 12.7* 11.9* 12.4*  HCT 39.5 36.8* 36.7*  MCV 101.3* 100.8* 97.9  PLT 448* 410* 336    Basic Metabolic Panel: Recent Labs  Lab 07/07/23 2144 07/08/23 0154 07/09/23 0841  NA 136 137 133*  K 4.2 4.3 3.5  CL 103 102 100  CO2 20* 20* 23  GLUCOSE 84 118* 155*  BUN 13 13 15   CREATININE 1.07 0.98 1.32*  CALCIUM 8.5* 8.7* 8.1*  MG  --  1.9 1.8  PHOS  --  3.2  --    GFR: Estimated Creatinine Clearance: 86.6 mL/min (A) (by C-G formula based on SCr of 1.32 mg/dL (H)). Recent Labs  Lab 07/07/23 2144 07/08/23 0154 07/09/23 0841  PROCALCITON  --  0.11  --   WBC 7.8 7.4 7.5  LATICACIDVEN  --  1.5  --     Liver Function Tests: Recent Labs  Lab 07/08/23 0154 07/09/23 0841  AST 32 41  ALT 31 38  ALKPHOS 72 83  BILITOT 1.7* 1.2  PROT 6.5 6.7  ALBUMIN 2.4* 2.3*   No results for input(s): "LIPASE", "AMYLASE" in the last 168 hours. No results for input(s): "AMMONIA" in the last 168 hours.  ABG    Component Value Date/Time   PHART 7.225 (L) 05/09/2017 1528   PCO2ART 52.4 (H) 05/09/2017 1528   PO2ART 50.0 (L) 05/09/2017 1528   HCO3 21.7 05/09/2017 1528   TCO2 23 05/09/2017 1528   ACIDBASEDEF 6.0 (H) 05/09/2017 1528   O2SAT 77.0 05/09/2017 1528     Coagulation Profile: No results for input(s): "INR", "PROTIME" in the last 168 hours.  Cardiac Enzymes: No results for input(s): "CKTOTAL", "CKMB", "CKMBINDEX", "TROPONINI" in the last 168 hours.  HbA1C: No results found for: "HGBA1C"  CBG: No results for input(s): "GLUCAP" in the last 168 hours.    Dirk Dress, NP Pulmonary/Critical Care Medicine  07/09/2023  11:05 AM   See Loretha Stapler for personal pager PCCM on call pager [phone] until 7pm. Please call Elink 7p-7a. [phone]

## 2023-07-09 NOTE — ED Notes (Signed)
Trauma Event Note    NON- TRAUMA pt -- assisted primary RN with Pleuravac change- initial pleuravac had 1700cc fluid.    Last imported Vital Signs BP (!) 119/90   Pulse (!) 126   Temp 97.6 F (36.4 C) (Oral)   Resp 19   Ht 6\' 6"  (1.981 m)   Wt 235 lb (106.6 kg)   SpO2 100%   BMI 27.16 kg/m   Trending CBC Recent Labs    07/07/23 2144 07/08/23 0154 07/09/23 0841  WBC 7.8 7.4 7.5  HGB 12.7* 11.9* 12.4*  HCT 39.5 36.8* 36.7*  PLT 448* 410* 336    Trending Coag's No results for input(s): "APTT", "INR" in the last 72 hours.  Trending BMET Recent Labs    07/07/23 2144 07/08/23 0154 07/09/23 0841  NA 136 137 133*  K 4.2 4.3 3.5  CL 103 102 100  CO2 20* 20* 23  BUN 13 13 15   CREATININE 1.07 0.98 1.32*  GLUCOSE 84 118* 155*      Anthony Singleton [name]  Trauma Response RN  Please call TRN at [phone] for further assistance.

## 2023-07-09 NOTE — Progress Notes (Signed)
   07/09/23 1544  Assess: MEWS Score  Temp 97.7 F (36.5 C)  BP 113/85  MAP (mmHg) 95  Pulse Rate (!) 109  ECG Heart Rate (!) 125  Resp 20  Level of Consciousness Alert  SpO2 93 %  O2 Device Nasal Cannula  O2 Flow Rate (L/min) 2 L/min  Assess: MEWS Score  MEWS Temp 0  MEWS Systolic 0  MEWS Pulse 2  MEWS RR 0  MEWS LOC 0  MEWS Score 2  MEWS Score Color Yellow  Assess: if the MEWS score is Yellow or Red  Were vital signs accurate and taken at a resting state? Yes  Does the patient meet 2 or more of the SIRS criteria? No  MEWS guidelines implemented  Yes, yellow  Treat  MEWS Interventions Considered administering scheduled or prn medications/treatments as ordered  Take Vital Signs  Increase Vital Sign Frequency  Yellow: Q2hr x1, continue Q4hrs until patient remains green for 12hrs  Escalate  MEWS: Escalate Yellow: Discuss with charge nurse and consider notifying provider and/or RRT  Provider Notification  Provider Name/Title Dr. Janee Morn  Date Provider Notified 07/09/23  Time Provider Notified 1611  Method of Notification Page (Secure chat)  Notification Reason  (Elevated HR)  Provider response No new orders  Date of Provider Response 07/09/23  Time of Provider Response 1615  Assess: SIRS CRITERIA  SIRS Temperature  0  SIRS Respirations  0  SIRS Pulse 1  SIRS WBC 0  SIRS Score Sum  1

## 2023-07-09 NOTE — Procedures (Signed)
Insertion of Chest Tube Procedure Note  Anthony Singleton  [id number]  June 05, 1970  Date:07/09/23  Time:2:28 PM     Provider Performing: Henrene Hawking   Procedure: Chest Tube Insertion [id number])  Indication(s) Effusion  Consent Risks of the procedure as well as the alternatives and risks of each were explained to the patient and/or caregiver.  Consent for the procedure was obtained and is signed in the bedside chart  Anesthesia Topical only with 1% lidocaine    Time Out Verified patient identification, verified procedure, site/side was marked, verified correct patient position, special equipment/implants available, medications/allergies/relevant history reviewed, required imaging and test results available.   Sterile Technique Maximal sterile technique including full sterile barrier drape, hand hygiene, sterile gown, sterile gloves, mask, hair covering, sterile ultrasound probe cover (if used).   Procedure Description Ultrasound used to identify appropriate pleural anatomy for placement and overlying skin marked. Area of placement cleaned and draped in sterile fashion.  A 14 French pigtail pleural catheter was placed into the right pleural space using Seldinger technique. Appropriate return of fluid was obtained.  The tube was connected to atrium and placed on -20 cm H2O wall suction.   Complications/Tolerance None; patient tolerated the procedure well. Chest X-ray is ordered to verify placement.   EBL Minimal  Specimen(s) fluid pleural   [name] AGACNP-BC   [facility] 07/09/2023, 2:29 PM  Please see [email or web address] for pager details.  From 7A-7P if no response, please call [phone]. After hours, please call ELink [phone].

## 2023-07-10 ENCOUNTER — Other Ambulatory Visit (HOSPITAL_COMMUNITY): Payer: Self-pay

## 2023-07-10 ENCOUNTER — Inpatient Hospital Stay (HOSPITAL_COMMUNITY): Payer: BC Managed Care – PPO

## 2023-07-10 DIAGNOSIS — J918 Pleural effusion in other conditions classified elsewhere: Secondary | ICD-10-CM | POA: Diagnosis not present

## 2023-07-10 DIAGNOSIS — I5031 Acute diastolic (congestive) heart failure: Secondary | ICD-10-CM | POA: Diagnosis not present

## 2023-07-10 DIAGNOSIS — J189 Pneumonia, unspecified organism: Secondary | ICD-10-CM | POA: Diagnosis not present

## 2023-07-10 DIAGNOSIS — I2699 Other pulmonary embolism without acute cor pulmonale: Secondary | ICD-10-CM | POA: Diagnosis not present

## 2023-07-10 DIAGNOSIS — J90 Pleural effusion, not elsewhere classified: Secondary | ICD-10-CM | POA: Diagnosis not present

## 2023-07-10 DIAGNOSIS — I50814 Right heart failure due to left heart failure: Secondary | ICD-10-CM

## 2023-07-10 DIAGNOSIS — F419 Anxiety disorder, unspecified: Secondary | ICD-10-CM | POA: Diagnosis not present

## 2023-07-10 LAB — CBC WITH DIFFERENTIAL/PLATELET
Abs Immature Granulocytes: 0.03 10*3/uL (ref 0.00–0.07)
Basophils Absolute: 0 10*3/uL (ref 0.0–0.1)
Basophils Relative: 0 %
Eosinophils Absolute: 0 10*3/uL (ref 0.0–0.5)
Eosinophils Relative: 1 %
HCT: 39.7 % (ref 39.0–52.0)
Hemoglobin: 13.2 g/dL (ref 13.0–17.0)
Immature Granulocytes: 0 %
Lymphocytes Relative: 12 %
Lymphs Abs: 0.9 10*3/uL (ref 0.7–4.0)
MCH: 32.4 pg (ref 26.0–34.0)
MCHC: 33.2 g/dL (ref 30.0–36.0)
MCV: 97.3 fL (ref 80.0–100.0)
Monocytes Absolute: 0.5 10*3/uL (ref 0.1–1.0)
Monocytes Relative: 6 %
Neutro Abs: 5.9 10*3/uL (ref 1.7–7.7)
Neutrophils Relative %: 81 %
Platelets: 306 10*3/uL (ref 150–400)
RBC: 4.08 MIL/uL — ABNORMAL LOW (ref 4.22–5.81)
RDW: 13.5 % (ref 11.5–15.5)
WBC: 7.3 10*3/uL (ref 4.0–10.5)
nRBC: 0 % (ref 0.0–0.2)

## 2023-07-10 LAB — HEPARIN LEVEL (UNFRACTIONATED)
Heparin Unfractionated: 0.79 [IU]/mL — ABNORMAL HIGH (ref 0.30–0.70)
Heparin Unfractionated: 0.65 [IU]/mL (ref 0.30–0.70)

## 2023-07-10 LAB — APTT: aPTT: 91 s — ABNORMAL HIGH (ref 24–36)

## 2023-07-10 LAB — ECHOCARDIOGRAM COMPLETE
AR max vel: 2.92 cm2
AV Area VTI: 3.4 cm2
AV Area mean vel: 2.76 cm2
AV Mean grad: 2 mm[Hg]
AV Peak grad: 3.2 mm[Hg]
Ao pk vel: 0.9 m/s
Area-P 1/2: 4.52 cm2
Est EF: 15
Height: 78 in
S' Lateral: 5.9 cm
Weight: 3760 [oz_av]

## 2023-07-10 LAB — BASIC METABOLIC PANEL
Anion gap: 11 (ref 5–15)
BUN: 14 mg/dL (ref 6–20)
CO2: 25 mmol/L (ref 22–32)
Calcium: 8 mg/dL — ABNORMAL LOW (ref 8.9–10.3)
Chloride: 98 mmol/L (ref 98–111)
Creatinine, Ser: 1.13 mg/dL (ref 0.61–1.24)
GFR, Estimated: >60 mL/min (ref >60)
Glucose, Bld: 130 mg/dL — ABNORMAL HIGH (ref 70–99)
Potassium: 3.6 mmol/L (ref 3.5–5.1)
Sodium: 134 mmol/L — ABNORMAL LOW (ref 135–145)

## 2023-07-10 LAB — GLUCOSE, CAPILLARY: Glucose-Capillary: 115 mg/dL — ABNORMAL HIGH (ref 70–99)

## 2023-07-10 LAB — TRIGLYCERIDES, BODY FLUIDS: Triglycerides, Fluid: 16 mg/dL

## 2023-07-10 LAB — MRSA NEXT GEN BY PCR, NASAL: MRSA by PCR Next Gen: NOT DETECTED

## 2023-07-10 LAB — LACTIC ACID, PLASMA: Lactic Acid, Venous: 1.4 mmol/L (ref 0.5–1.9)

## 2023-07-10 MED ORDER — HEPARIN (PORCINE) 25000 UT/250ML-% IV SOLN
2050.0000 [IU]/h | INTRAVENOUS | Status: DC
  Administered 2023-07-10 – 2023-07-12 (×4): 1800 [IU]/h via INTRAVENOUS
  Administered 2023-07-12 – 2023-07-14 (×3): 2000 [IU]/h via INTRAVENOUS
  Filled 2023-07-10 (×7): qty 250

## 2023-07-10 MED ORDER — HYDROMORPHONE HCL 1 MG/ML IJ SOLN
1.0000 mg | INTRAMUSCULAR | Status: DC | PRN
  Administered 2023-07-10 – 2023-07-13 (×9): 1 mg via INTRAVENOUS
  Filled 2023-07-10 (×10): qty 1

## 2023-07-10 MED ORDER — SODIUM CHLORIDE (PF) 0.9 % IJ SOLN
10.0000 mg | Freq: Once | INTRAMUSCULAR | Status: AC
  Administered 2023-07-10: 10 mg via INTRAPLEURAL
  Filled 2023-07-10: qty 10

## 2023-07-10 MED ORDER — PERFLUTREN LIPID MICROSPHERE
1.0000 mL | INTRAVENOUS | Status: AC | PRN
  Administered 2023-07-10: 2 mL via INTRAVENOUS

## 2023-07-10 MED ORDER — POLYETHYLENE GLYCOL 3350 17 G PO PACK
17.0000 g | PACK | Freq: Every day | ORAL | Status: DC
  Administered 2023-07-10 – 2023-07-20 (×6): 17 g via ORAL
  Filled 2023-07-10 (×9): qty 1

## 2023-07-10 MED ORDER — STERILE WATER FOR INJECTION IJ SOLN
5.0000 mg | Freq: Once | RESPIRATORY_TRACT | Status: AC
  Administered 2023-07-10: 5 mg via INTRAPLEURAL
  Filled 2023-07-10: qty 5

## 2023-07-10 MED ORDER — OXYCODONE HCL 5 MG PO TABS
10.0000 mg | ORAL_TABLET | Freq: Four times a day (QID) | ORAL | Status: DC | PRN
  Administered 2023-07-11 – 2023-07-19 (×18): 10 mg via ORAL
  Filled 2023-07-10 (×19): qty 2

## 2023-07-10 MED ORDER — HEPARIN BOLUS VIA INFUSION
5000.0000 [IU] | Freq: Once | INTRAVENOUS | Status: AC
  Administered 2023-07-10: 5000 [IU] via INTRAVENOUS
  Filled 2023-07-10: qty 5000

## 2023-07-10 MED ORDER — ENOXAPARIN SODIUM 40 MG/0.4ML IJ SOSY
40.0000 mg | PREFILLED_SYRINGE | Freq: Every day | INTRAMUSCULAR | Status: DC
  Administered 2023-07-10: 40 mg via SUBCUTANEOUS
  Filled 2023-07-10: qty 0.4

## 2023-07-10 MED ORDER — MAGNESIUM SULFATE 2 GM/50ML IV SOLN
2.0000 g | Freq: Once | INTRAVENOUS | Status: AC
  Administered 2023-07-10: 2 g via INTRAVENOUS
  Filled 2023-07-10: qty 50

## 2023-07-10 MED ORDER — SENNOSIDES-DOCUSATE SODIUM 8.6-50 MG PO TABS
2.0000 | ORAL_TABLET | Freq: Every day | ORAL | Status: DC
  Administered 2023-07-10: 2 via ORAL
  Filled 2023-07-10 (×4): qty 2

## 2023-07-10 MED ORDER — ORAL CARE MOUTH RINSE: 15.0000 mL | OROMUCOSAL | Status: DC | PRN

## 2023-07-10 MED ORDER — IOHEXOL 350 MG/ML SOLN
75.0000 mL | Freq: Once | INTRAVENOUS | Status: AC | PRN
  Administered 2023-07-10: 75 mL via INTRAVENOUS

## 2023-07-10 MED ORDER — HALOPERIDOL LACTATE 5 MG/ML IJ SOLN: 4.0000 mg | Freq: Four times a day (QID) | INTRAMUSCULAR | Status: DC | PRN

## 2023-07-10 MED ORDER — POTASSIUM CHLORIDE 20 MEQ PO PACK
60.0000 meq | PACK | Freq: Once | ORAL | Status: AC
  Administered 2023-07-10: 60 meq via ORAL
  Filled 2023-07-10: qty 3
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
## 2023-07-10 NOTE — Progress Notes (Addendum)
Patient transported from CT department to ICU 2M09. Report was given to Harrold Donath, RN prior to transport.
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
## 2023-07-10 NOTE — Progress Notes (Signed)
Transported to CT with transport and RN at bedside.

## 2023-07-10 NOTE — Progress Notes (Signed)
Pharmacy Consult for Heparin Indication: pulmonary embolus  No Known Allergies  Patient Measurements: Height: 6\' 6"  (198.1 cm) Weight: 100.5 kg (221 lb 9 oz) IBW/kg (Calculated) : 91.4 HEPARIN DW (KG): 106.6  Vital Signs: Temp: 97.9 F (36.6 C) (02/14 0800) Temp Source: Oral (02/14 0800) BP: 118/86 (02/14 0800) Pulse Rate: 127 (02/14 0903)  Labs: Recent Labs    07/08/23 0154 07/09/23 0841 07/10/23 0525  HGB 11.9* 12.4* 13.2  HCT 36.8* 36.7* 39.7  PLT 410* 336 306  CREATININE 0.98 1.32* 1.13    Estimated Creatinine Clearance: 101.1 mL/min (by C-G formula based on SCr of 1.13 mg/dL).  Assessment: Anthony Singleton Near a 53 y.o. male presented with c/f PE. Pharmacy has been consulted for heparin dosing. CBC WNL.   Anticoagulation PTA: Patient not on anticoagulation PTA.  Goal of Therapy:  Heparin level 0.3-0.7 units/ml Monitor platelets by anticoagulation protocol: Yes   Plan:  Heparin 5,000 units bolus via infusion x1  Start heparin infusion at 1,800 units/ hour  D/C lovenox for DVT ppx  Anti-Xa/aPTT in 6 hours - checking aPTT as HL may be falsely elevated d/t AM lovenox dose   Jani Gravel, PharmD Clinical Pharmacist  07/10/2023 11:22 AM

## 2023-07-10 NOTE — TOC Benefit Eligibility Note (Signed)
Pharmacy Patient Advocate Encounter  Insurance verification completed.    The patient is insured through [facility]. Patient has [employer], may use a copay card, and/or apply for patient assistance if available.    Ran test claim for Farxiga 10mg  and the current 30 day co-pay is 51.89.  Ran test claim for Jardiance 10mg  and the current 30 day co-pay is 176.89.  The patient also has Monia Pouch and the Marcelline Deist is not on the formulary and the Jardiance requires a prior authorization.     This test claim was processed through [facility]- copay amounts may vary at other pharmacies due to pharmacy/plan contracts, or as the patient moves through the different stages of their insurance plan.

## 2023-07-10 NOTE — Progress Notes (Signed)
Brief PCCM progress note  Repeat CTA reveals acute left PE which indicates likely mobile atrial thrombus has now moved. RV/LV ratio 0.7. On repeat eval patient appears more stable with slight improvement in vials to include slightly improved tachycardia. Lactic remains negative No acute indication for clot intervention at this time. Continue heparin drip. Patient has no absolute or relative contradictions for systemic TPA if her were to decompensate.   Anthony Locicero D. Harris, NP-C [facility] Personal contact information can be found on Amion  If no contact or response made please call [phone] 07/10/2023, 6:42 PM  .

## 2023-07-10 NOTE — Consult Note (Addendum)
Cardiology Consultation   Patient ID: Anthony Singleton MRN: [id number]; DOB: [birth year]  Admit date: 07/07/2023 Date of Consult: 07/10/2023  PCP:  Ranelle Oyster, MD   [facility] Providers Cardiologist:  None        Patient Profile:   Anthony Singleton is a 53 y.o. male with a hx of remote traumatic brain injury, recent pneumonia who is being seen 07/10/2023 for the evaluation of new cardiomyopathy at the request of Dr. Jerral Ralph.  History of Present Illness:   Anthony Singleton was recently hospitalized at [facility] for pneumonia. He has had shortness of breath for the last 10 days and never felt like he improved after his hospitalization. CT here 2/12 showed no PE but prominent bronchopneumonia and L right pleural effusion. He had chest tube placed 2/13. He had echocardiogram done today which showed biventricular heart failure and large thrombus in transit between the RV and the RA. Critical findings called, patient started on heparin, PCCM and IR involved with ultimate decision to not proceed with thrombectomy.  From a heart failure standpoint, he does not have prior known history of heart failure. He had an echo in 2019 with an EF of 50-55%. He tells me that he has a lot of people with coronary disease in his family but no heart failure that he is aware of. He felt absolutely fine three weeks ago before presenting to [place]. He had not had any limitations in his activity, swelling, or shortness of breath before that.   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 Authorizing Provider  acetaminophen  (TYLENOL) 500 MG tablet Take 1,000 mg by mouth 2 (two) times daily as needed for moderate pain (pain score 4-6), headache or fever.   Yes [provider]  levofloxacin (LEVAQUIN) 750 MG tablet Take 750 mg by mouth daily. Patient not taking: Reported on 07/08/2023 06/24/23   [provider]    Inpatient Medications: Scheduled Meds:  feeding supplement  237 mL Oral TID BM   furosemide  40 mg Intravenous BID   LORazepam  2 mg Intravenous Once   polyethylene glycol  17 g Oral Daily   senna-docusate  2 tablet Oral QHS   sodium chloride flush  10 mL Intrapleural Q8H   Continuous Infusions:  azithromycin 500 mg (07/10/23 1120)   cefTRIAXone (ROCEPHIN)  IV 2 g (07/10/23 1113)   heparin 1,800 Units/hr (07/10/23 1247)   PRN Meds: acetaminophen **OR** acetaminophen, haloperidol lactate, HYDROcodone bit-homatropine, HYDROmorphone (DILAUDID) injection, hydrOXYzine, melatonin, ondansetron (ZOFRAN) IV, mouth rinse, oxyCODONE  Allergies:   No Known Allergies  Social History:   Social History   Socioeconomic History   Marital status: Married    Spouse name: Not on file   Number of children: Not on file   Years of education: Not on file   Highest education level: Not on file  Occupational History   Not on file  Tobacco Use   Smoking status: Never   Smokeless tobacco: Never  Vaping Use   Vaping status: Never Used  Substance and Sexual Activity   Alcohol use: No   Drug use: Yes    Types: Amphetamines, Marijuana    Comment: "crack" pipe found on person, + for amphetamines, THC   Sexual activity: Yes  Other Topics Concern   Not on file  Social History Narrative   Not on file   Social Drivers of Health   Financial Resource Strain: Not on file  Food Insecurity: Not on file  Transportation Needs: Not on file  Physical Activity: Not on file  Stress: Not on file  Social Connections: Not on file  Intimate Partner Violence: Not on file    Family History:    Family  History  Problem Relation Age of Onset   Stroke Mother    Stroke Father      ROS:  Please see the history of present illness.  All other ROS reviewed and negative.     Physical Exam/Data:   Vitals:   07/10/23 1200 07/10/23 1329 07/10/23 1400 07/10/23 1605  BP: (!) 105/91 112/84 (!) 113/97   Pulse: (!) 142 (!) 140 (!) 132   Resp: 16 (!) 22 13   Temp: 97.8 F (36.6 C)   98.3 F (36.8 C)  TempSrc: Oral   Oral  SpO2: (!) 87% 93% 91%   Weight:      Height:        Intake/Output Summary (Last 24 hours) at 07/10/2023 1909 Last data filed at 07/10/2023 1330 Gross per 24 hour  Intake --  Output 1440 ml  Net -1440 ml      07/10/2023    8:42 AM 07/09/2023   11:56 PM 07/07/2023    9:29 PM  Last 3 Weights  Weight (lbs) 221 lb 9 oz 222 lb 0.1 oz 235 lb  Weight (kg) 100.5 kg 100.7 kg 106.595 kg     Body mass index is 25.6 kg/m.  General:  conversationally dyspneic HEENT: normal Neck: no JVD Vascular: No carotid bruits; Distal pulses 2+ bilaterally Cardiac:  normal S1, S2; RRR; no murmur  Lungs:  chest tube in place. Diffusely coarse Abd: soft, nontender, no hepatomegaly  Ext: no edema Musculoskeletal:  No deformities, BUE and BLE strength normal and equal Skin: warm and dry  Neuro:  CNs 2-12 intact, no focal abnormalities noted Psych:  Normal affect   EKG:  The EKG was personally reviewed and demonstrates:  sinus tachycardia Telemetry:  Telemetry was personally reviewed and demonstrates:  sinus tachycardia  Relevant CV Studies: Echo personally reviewed, see interpretation below. CT personally reviewed, see interpretation below  Laboratory Data:  High Sensitivity Troponin:   Recent Labs  Lab 07/07/23 2144 07/07/23 2345  TROPONINIHS 26* 21*     Chemistry Recent Labs  Lab 07/08/23 0154 07/09/23 0841 07/10/23 0525  NA 137 133* 134*  K 4.3 3.5 3.6  CL 102 100 98  CO2 20* 23 25  GLUCOSE 118* 155* 130*  BUN 13 15 14   CREATININE 0.98 1.32* 1.13  CALCIUM 8.7*  8.1* 8.0*  MG 1.9 1.8  --   GFRNONAA >60 >60 >60  ANIONGAP 15 10 11     Recent Labs  Lab 07/08/23 0154 07/09/23 0841 07/09/23 1714  PROT 6.5 6.7  --   ALBUMIN 2.4* 2.3* 2.3*  AST 32 41  --   ALT 31 38  --   ALKPHOS 72 83  --   BILITOT 1.7* 1.2  --    Lipids No results for input(s): "CHOL", "TRIG", "HDL", "LABVLDL", "LDLCALC", "CHOLHDL" in the last  168 hours.  Hematology Recent Labs  Lab 07/08/23 0154 07/09/23 0841 07/10/23 0525  WBC 7.4 7.5 7.3  RBC 3.65* 3.75* 4.08*  HGB 11.9* 12.4* 13.2  HCT 36.8* 36.7* 39.7  MCV 100.8* 97.9 97.3  MCH 32.6 33.1 32.4  MCHC 32.3 33.8 33.2  RDW 13.8 13.6 13.5  PLT 410* 336 306   Thyroid  Recent Labs  Lab 07/08/23 0154  TSH 1.409  FREET4 1.52*    BNP Recent Labs  Lab 07/07/23 2144 07/09/23 0841  BNP 1,504.8* 1,055.2*    DDimer  Recent Labs  Lab 07/07/23 2144  DDIMER 18.01*     Radiology/Studies:  CT Angio Chest Pulmonary Embolism (PE) W or WO Contrast Result Date: 07/10/2023 CLINICAL DATA:  Pulmonary embolism (PE) suspected, high prob atrial thrombus. TEE with clot-in-transit. EXAM: CT ANGIOGRAPHY CHEST WITH CONTRAST TECHNIQUE: Multidetector CT imaging of the chest was performed using the standard protocol during bolus administration of intravenous contrast. Multiplanar CT image reconstructions and MIPs were obtained to evaluate the vascular anatomy. RADIATION DOSE REDUCTION: This exam was performed according to the departmental dose-optimization program which includes automated exposure control, adjustment of the mA and/or kV according to patient size and/or use of iterative reconstruction technique. CONTRAST:  75mL OMNIPAQUE IOHEXOL 350 MG/ML SOLN COMPARISON:  CTA PE, 07/08/2023. Chest XR, 07/09/2023. Echocardiogram, earlier same day. FINDINGS: Cardiovascular: Satisfactory opacification of the pulmonary arteries to the segmental level. Pulmonary embolus, at the LEFT pulmonary arterial bifurcation. Cardiomegaly with multi  chamber cardiac enlargement, most prominently within the LEFT ventricle. No pericardial effusion. RV/LV ratio of 0.7. Mediastinum/Nodes: No enlarged mediastinal, hilar, or axillary lymph nodes. Thyroid gland, trachea, and esophagus demonstrate no significant findings. Lungs/Pleura: Pulmonary interstitial thickening, greater on RIGHT small volume RIGHT pleural effusion. Trace RIGHT pneumothorax with basilar pigtail thoracostomy tube. RIGHT dependent basilar consolidation. The LEFT lung is relatively clear. Upper Abdomen: No acute abnormality. 2.0 x 1.2 cm low density LEFT adrenal lesion, measuring -2 HU. Punctate LEFT renal calculus, incompletely imaged. Musculoskeletal: Stranding with relative obliteration of the normal fat planes along the RIGHT jugular sheath extending to the thoracic inlet. No acute osseous findings. Review of the MIP images confirms the above findings. IMPRESSION: 1. Examination is POSITIVE for LEFT segmental pulmonary emboli, at the pulmonary arterial bifurcation. RV/LV ratio of 0.7. Findings suggest interval embolization of prior clot-in-transit seen on comparison echocardiogram. 2. Trace RIGHT pneumothorax with basilar pigtail thoracostomy tube. 3. Small volume RIGHT pleural effusion. Dependent pulmonary consolidation is likely to represent atelectasis, however superimposed pneumonia can appear similar. 4. Fat stranding along the RIGHT jugular sheath, correlate for potential recent cannulation attempt. 5. 2 cm low-density LEFT adrenal mass, consistent with lipid-rich benign adenoma. No follow-up imaging is recommended. JACR 2017 Aug; 14(8):1038-44, JCAT 2016 Mar-Apr; 40(2):194-200, Urol J 2006 Spring; 3(2):71-4. 6. Cardiomegaly with LEFT ventricular enlargement. Additional incidental, chronic and senescent findings as above. These results were called by telephone at the time of interpretation on 07/10/2023 at 2:50 p.m. to provider [facility] , who verbally acknowledged these results. Roanna Banning, MD [facility] Radiology Electronically Signed   By: Roanna Banning M.D.   On: 07/10/2023 16:10   ECHOCARDIOGRAM COMPLETE Result Date: 07/10/2023    ECHOCARDIOGRAM REPORT   Patient Name:   Anthony Singleton Date of Exam: 07/10/2023 Medical Rec #:  [id number]             Height:       78.0 in Accession #:    [id number]  Weight:       222.0 lb Date of Birth:  Oct 21, 1970             BSA:          2.360 m Patient Age:    52 years              BP:           117/85 mmHg Patient Gender: M                     HR:           126 bpm. Exam Location:  Inpatient Procedure: 2D Echo, Cardiac Doppler, Color Doppler and Intracardiac            Opacification Agent (Both Spectral and Color Flow Doppler were            utilized during procedure). Indications:    CHF-Acute Diastolic I50.31  History:        Patient has no prior history of Echocardiogram examinations.  Sonographer:    Darlys Gales Referring Phys: [id number] DANIEL V THOMPSON IMPRESSIONS  1. 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 Intermittently crossing tricuspid valve.  2. Left ventricular ejection fraction, by estimation, is 15%. The left ventricle has severely decreased function. The left ventricle demonstrates global hypokinesis. The left ventricular internal cavity size was moderately dilated. There is mild left ventricular hypertrophy of the infero-lateral segment. Left ventricular diastolic parameters are indeterminate.  3. Right ventricular systolic function is moderately reduced. The right ventricular size is mildly enlarged.  4. Left atrial size was mildly dilated.  5. The mitral valve is grossly normal. Mild mitral valve regurgitation. No evidence of mitral stenosis.  6. The aortic valve is grossly normal. Aortic valve regurgitation is not visualized. No aortic stenosis is present. Conclusion(s)/Recommendation(s): No left ventricular mural or apical thrombus/thrombi. Critical  findings reported to Dr. Jerral Ralph and acknowledged at 07/10/23 11:05 am. FINDINGS  Left Ventricle: Left ventricular ejection fraction, by estimation, is 15%. The left ventricle has severely decreased function. The left ventricle demonstrates global hypokinesis. The left ventricular internal cavity size was moderately dilated. There is  mild left ventricular hypertrophy of the infero-lateral segment. Left ventricular diastolic parameters are indeterminate. Right Ventricle: The right ventricular size is mildly enlarged. No increase in right ventricular wall thickness. Right ventricular systolic function is moderately reduced. Left Atrium: Left atrial size was mildly dilated. Right Atrium: Right atrial size was normal in size. Pericardium: Trivial pericardial effusion is present. Mitral Valve: The mitral valve is grossly normal. Mild mitral valve regurgitation. No evidence of mitral valve stenosis. Tricuspid Valve: The tricuspid valve is normal in structure. Tricuspid valve regurgitation is mild . No evidence of tricuspid stenosis. Aortic Valve: The aortic valve is grossly normal. Aortic valve regurgitation is not visualized. No aortic stenosis is present. Aortic valve mean gradient measures 2.0 mmHg. Aortic valve peak gradient measures 3.2 mmHg. Aortic valve area, by VTI measures 3.40 cm. Pulmonic Valve: The pulmonic valve was normal in structure. Pulmonic valve regurgitation is trivial. No evidence of pulmonic stenosis. Aorta: The aortic root is normal in size and structure. Venous: The inferior vena cava was not well visualized. IAS/Shunts: No atrial level shunt detected by color flow Doppler.  LEFT VENTRICLE PLAX 2D LVIDd:         6.30 cm   Diastology LVIDs:         5.90 cm   LV e' medial:    6.20 cm/s  LV PW:         1.10 cm   LV E/e' medial:  11.7 LV IVS:        0.80 cm   LV e' lateral:   14.00 cm/s LVOT diam:     2.10 cm   LV E/e' lateral: 5.2 LV SV:         36 LV SV Index:   15 LVOT Area:     3.46 cm  RIGHT  VENTRICLE RV S prime:     5.87 cm/s TAPSE (M-mode): 0.7 cm LEFT ATRIUM             Index        RIGHT ATRIUM           Index LA Vol (A2C):   54.4 ml 23.05 ml/m  RA Area:     20.00 cm LA Vol (A4C):   94.8 ml 40.17 ml/m  RA Volume:   61.10 ml  25.89 ml/m LA Biplane Vol: 78.8 ml 33.39 ml/m  AORTIC VALVE AV Area (Vmax):    2.92 cm AV Area (Vmean):   2.76 cm AV Area (VTI):     3.40 cm AV Vmax:           89.90 cm/s AV Vmean:          70.700 cm/s AV VTI:            0.106 m AV Peak Grad:      3.2 mmHg AV Mean Grad:      2.0 mmHg LVOT Vmax:         75.80 cm/s LVOT Vmean:        56.400 cm/s LVOT VTI:          0.104 m LVOT/AV VTI ratio: 0.98  AORTA Ao Root diam: 3.20 cm MITRAL VALVE               TRICUSPID VALVE MV Area (PHT): 4.52 cm    TR Peak grad:   25.2 mmHg MV Decel Time: 168 msec    TR Vmax:        251.00 cm/s MV E velocity: 72.30 cm/s                            SHUNTS                            Systemic VTI:  0.10 m                            Systemic Diam: 2.10 cm Weston Brass MD Electronically signed by Weston Brass MD Signature Date/Time: 07/10/2023/11:13:31 AM    Final    DG Chest Port 1 View Result Date: 07/09/2023 CLINICAL DATA:  [id number] Encounter for chest tube placement [id number]. EXAM: PORTABLE CHEST 1 VIEW COMPARISON:  07/07/2023. FINDINGS: Low lung volume. There are heterogeneous opacities at the right lung base, overall decreased since the prior study. Mild diffuse pulmonary vascular congestion noted. No frank pulmonary edema. Left lung is grossly clear. Left lateral costophrenic angle is clear. Blunting of right lateral costophrenic angle is likely secondary to combination of pleural effusion and lung parenchymal opacity. Interval placement of right-sided pleural drainage catheter. No pneumothorax. Stable cardio-mediastinal silhouette. No acute osseous abnormalities. The soft tissues are within normal limits. IMPRESSION: 1. Mild diffuse pulmonary vascular congestion without frank pulmonary  edema. 2. Heterogeneous opacities  in the right lung base overall decreased since the prior study. Findings may represent combination of consolidation and/or atelectasis. Blunting of right lateral costophrenic angle may be due to combination of pleural effusion and lung parenchymal opacity. 3. Interval placement of right-sided pleural drainage catheter. No pneumothorax. Overall, there is significant interval decrease in the right pleural effusion. Electronically Signed   By: Jules Schick M.D.   On: 07/09/2023 16:43   DG Neck Soft Tissue Result Date: 07/09/2023 CLINICAL DATA:  Right-sided neck pain. EXAM: NECK SOFT TISSUES - 1+ VIEW COMPARISON:  None Available. FINDINGS: There is no evidence of retropharyngeal soft tissue swelling or epiglottic enlargement. The cervical airway is unremarkable and no radio-opaque foreign body identified. Mild degenerative changes of the cervical spine noted. IMPRESSION: Negative. Electronically Signed   By: Jules Schick M.D.   On: 07/09/2023 13:35   CT Angio Chest PE W and/or Wo Contrast Result Date: 07/08/2023 CLINICAL DATA:  Positive D-dimer. Shortness of breath. PE suspected. EXAM: CT ANGIOGRAPHY CHEST WITH CONTRAST TECHNIQUE: Multidetector CT imaging of the chest was performed using the standard protocol during bolus administration of intravenous contrast. Multiplanar CT image reconstructions and MIPs were obtained to evaluate the vascular anatomy. RADIATION DOSE REDUCTION: This exam was performed according to the departmental dose-optimization program which includes automated exposure control, adjustment of the mA and/or kV according to patient size and/or use of iterative reconstruction technique. CONTRAST:  75mL OMNIPAQUE IOHEXOL 350 MG/ML SOLN COMPARISON:  CTA chest 06/23/2023 FINDINGS: Cardiovascular: Cardiomegaly. No pericardial effusion. No central pulmonary embolism. There is poor opacification of the right lower and middle lobe pulmonary arteries which is favored  due to reduced cardiac output and mixing artifact. The majority of contrast remains in the right heart. Right lower lobe segmental and subsegmental pulmonary emboli can not be excluded. Elevated right heart pressures with reflux of contrast into the IVC and hepatic veins. Mediastinum/Nodes: Trachea and esophagus are unremarkable. Shotty mediastinal and hilar lymph nodes are favored reactive. Lungs/Pleura: Large right pleural effusion, increased from 06/23/2023. Bronchial wall thickening and mucous plugging in the right lower and middle lobes. Ground-glass opacities and peribronchovascular consolidation in the right lower lobe and right middle lobe. Interlobular septal thickening in the right lower and middle lobes. These findings have progressed from 06/23/2023. The left lung is clear. No pneumothorax. Upper Abdomen: No acute abnormality. Musculoskeletal: No acute fracture. Review of the MIP images confirms the above findings. IMPRESSION: 1. No central pulmonary embolism. Poor opacification of the right lower and middle lobe pulmonary arteries is favored due to reduced cardiac output and mixing artifact. Right lower lobe segmental and subsegmental pulmonary emboli can not be excluded. 2. Right lower and middle lobe bronchopneumonia increased from 06/23/2023. 3. Large right pleural effusion, increased from 06/23/2023. 4. Cardiomegaly with elevated right heart pressures. Electronically Signed   By: Minerva Fester M.D.   On: 07/08/2023 02:36   DG Chest 2 View Result Date: 07/07/2023 CLINICAL DATA:  Shortness of breath. EXAM: CHEST - 2 VIEW COMPARISON:  June 23, 2023 FINDINGS: The cardiac silhouette is mildly enlarged and unchanged in size. Marked severity infiltrate is seen within the right lung base. This is increased in severity when compared to the prior study. Mild left basilar atelectasis and/or infiltrate is also noted. There is a small right pleural effusion. No pneumothorax is identified. The visualized  skeletal structures are unremarkable. IMPRESSION: 1. Marked severity right basilar infiltrate, increased in severity when compared to the prior study. 2. Mild left basilar atelectasis and/or infiltrate.  3. Small right pleural effusion. Electronically Signed   By: Aram Candela M.D.   On: 07/07/2023 22:18     Assessment and Plan:   Venous thrombus in transit across RA/RV, with CT showing embolization to PA consistently with pulmonary embolism -based on IR/critical care notes, no plans for thrombectomy at this time -currently on heparin drip, management per PCCM -he is not volume overloaded, but there is reduced RV function. Unclear if this is acute due to PE or chronic due to biventricular failure as below  Biventricular heart failure: new diagnosis. Denies any symptoms prior to three weeks ago -I personally reviewed his echo. EF <20%, global hypokinesis. RV enlarged with decreased function -I personally reviewed his CT. Cardiomegaly. R side with prominent pleural effusion and chest tube in place. PE seen in left PA -he is warm on exam. In sinus tachycardia, which may be compensatory. Would hold on adding beta blocker for now -blood pressure is borderline, would also hold on adding ARNI/ARB, MRA -continue IV diuresis with furosemide -we discussed that once he is more stable, will need to do evaluation for ischemia. Likely left heart cath only given known PE.   Community acquired pneumonia, with parapneumonic pleural effusion -management per primary team  We will continue to follow  For questions or updates, please contact [facility] Please consult [email or web address] for contact info under    Signed, Jodelle Red, MD  07/10/2023 7:09 PM
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
## 2023-07-10 NOTE — Progress Notes (Addendum)
Pharmacy Consult for Heparin Indication: pulmonary embolus  No Known Allergies  Patient Measurements: Height: 6\' 6"  (198.1 cm) Weight: 100.5 kg (221 lb 9 oz) IBW/kg (Calculated) : 91.4 HEPARIN DW (KG): 106.6  Vital Signs: Temp: 98.3 F (36.8 C) (02/14 1605) Temp Source: Oral (02/14 1605) BP: 113/97 (02/14 1400) Pulse Rate: 132 (02/14 1400)  Labs: Recent Labs    07/08/23 0154 07/09/23 0841 07/10/23 0525 07/10/23 1718  HGB 11.9* 12.4* 13.2  --   HCT 36.8* 36.7* 39.7  --   PLT 410* 336 306  --   APTT  --   --   --  91*  HEPARINUNFRC  --   --   --  0.79*  CREATININE 0.98 1.32* 1.13  --     Estimated Creatinine Clearance: 101.1 mL/min (by C-G formula based on SCr of 1.13 mg/dL).  Assessment: Anthony Singleton a 53 y.o. male on heparin for R atrial thrombus and CT scan showing L segmental PE. No AC PTA.  Heparin level 0.79 (slightly supratherapeutic) -?affected by AM VTE prophylactic lovenox. aPTT 91 sec (therapeutic) on infusion at 1800 units/hr. No bleeding noted.  Goal of Therapy:  Heparin level 0.3-0.7 units/ml Monitor platelets by anticoagulation protocol: Yes   Plan:  Continue heparin infusion at 1800 units/ hour  Will f/u 6 hr confirmatory heparin level  Christoper Fabian, PharmD, BCPS Please see amion for complete clinical pharmacist phone list 07/10/2023 6:08 PM   ADDENDUM (2250) Heparin level therapeutic (0.65) on infusion at 1800 units/hr No changes. Will f/u daily heparin level  Christoper Fabian, PharmD, BCPS Please see amion for complete clinical pharmacist phone list 07/10/2023 10:50 PM
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
## 2023-07-10 NOTE — Progress Notes (Signed)
Patient to be transferred to ICU for mobile thrombus to right atrium. Currently needing an access. Attempts have been made. IV team consulted.

## 2023-07-11 ENCOUNTER — Inpatient Hospital Stay (HOSPITAL_COMMUNITY): Payer: BC Managed Care – PPO

## 2023-07-11 DIAGNOSIS — R609 Edema, unspecified: Secondary | ICD-10-CM | POA: Diagnosis not present

## 2023-07-11 DIAGNOSIS — J90 Pleural effusion, not elsewhere classified: Secondary | ICD-10-CM | POA: Diagnosis not present

## 2023-07-11 DIAGNOSIS — I5082 Biventricular heart failure: Secondary | ICD-10-CM | POA: Diagnosis not present

## 2023-07-11 DIAGNOSIS — M7989 Other specified soft tissue disorders: Secondary | ICD-10-CM

## 2023-07-11 DIAGNOSIS — I2699 Other pulmonary embolism without acute cor pulmonale: Secondary | ICD-10-CM | POA: Diagnosis not present

## 2023-07-11 LAB — CBC
HCT: 41.3 % (ref 39.0–52.0)
Hemoglobin: 13.6 g/dL (ref 13.0–17.0)
MCH: 32.3 pg (ref 26.0–34.0)
MCHC: 32.9 g/dL (ref 30.0–36.0)
MCV: 98.1 fL (ref 80.0–100.0)
Platelets: 301 10*3/uL (ref 150–400)
RBC: 4.21 MIL/uL — ABNORMAL LOW (ref 4.22–5.81)
RDW: 13.7 % (ref 11.5–15.5)
WBC: 7.8 10*3/uL (ref 4.0–10.5)
nRBC: 0 % (ref 0.0–0.2)

## 2023-07-11 LAB — BASIC METABOLIC PANEL
Anion gap: 10 (ref 5–15)
BUN: 14 mg/dL (ref 6–20)
CO2: 26 mmol/L (ref 22–32)
Calcium: 8.1 mg/dL — ABNORMAL LOW (ref 8.9–10.3)
Chloride: 98 mmol/L (ref 98–111)
Creatinine, Ser: 1.21 mg/dL (ref 0.61–1.24)
GFR, Estimated: >60 mL/min (ref >60)
Glucose, Bld: 130 mg/dL — ABNORMAL HIGH (ref 70–99)
Potassium: 4.1 mmol/L (ref 3.5–5.1)
Sodium: 134 mmol/L — ABNORMAL LOW (ref 135–145)

## 2023-07-11 LAB — HEPARIN LEVEL (UNFRACTIONATED): Heparin Unfractionated: 0.44 [IU]/mL (ref 0.30–0.70)

## 2023-07-11 LAB — ALBUMIN: Albumin: 2.2 g/dL — ABNORMAL LOW (ref 3.5–5.0)

## 2023-07-11 LAB — LACTATE DEHYDROGENASE: LDH: 209 U/L — ABNORMAL HIGH (ref 98–192)

## 2023-07-11 LAB — PROTEIN, TOTAL: Total Protein: 6.9 g/dL (ref 6.5–8.1)

## 2023-07-11 MED ORDER — CYCLOBENZAPRINE HCL 5 MG PO TABS
5.0000 mg | ORAL_TABLET | Freq: Three times a day (TID) | ORAL | Status: DC | PRN
  Administered 2023-07-11: 5 mg via ORAL
  Filled 2023-07-11 (×2): qty 1

## 2023-07-11 MED ORDER — SODIUM CHLORIDE 0.9 % IV SOLN: INTRAVENOUS | Status: AC | PRN

## 2023-07-11 MED ORDER — PIPERACILLIN-TAZOBACTAM 3.375 G IVPB
3.3750 g | Freq: Three times a day (TID) | INTRAVENOUS | Status: AC
  Administered 2023-07-11 – 2023-07-17 (×20): 3.375 g via INTRAVENOUS
  Filled 2023-07-11 (×20): qty 50

## 2023-07-11 MED ORDER — LIDOCAINE 5 % EX PTCH
1.0000 | MEDICATED_PATCH | CUTANEOUS | Status: DC
  Administered 2023-07-11 – 2023-07-13 (×3): 1 via TRANSDERMAL
  Filled 2023-07-11 (×8): qty 1

## 2023-07-11 MED ORDER — CHLORHEXIDINE GLUCONATE CLOTH 2 % EX PADS
6.0000 | MEDICATED_PAD | Freq: Every day | CUTANEOUS | Status: DC
Start: 2023-07-11 — End: 2023-07-19
  Administered 2023-07-12 – 2023-07-18 (×7): 6 via TOPICAL
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
## 2023-07-11 NOTE — Progress Notes (Signed)
 VASCULAR LAB    Bilateral upper extremity venous duplex has been performed.  See CV proc for preliminary results.   Demorio Seeley, RVT 07/11/2023, 4:21 PM

## 2023-07-11 NOTE — Progress Notes (Signed)
 Pharmacy Antibiotic Note  Anthony Singleton is a 53 y.o. male admitted on 07/07/2023 with  dyspnea found with submassive pulmonary embolism, initiated on ceftriaxone/azithromycin at that time for CAP .  Pharmacy has been consulted for pip/tazo dosing in the setting of worsening symptoms. Chest tube in place, required dornase 2/14 with 955 mL output. Currently afebrile, WBC normal at 7.8, LA 1.4 on 2/14 evening.   Plan: Start Zosyn 3.375g IV q8h (4 hour infusion). Continue azithromycin x5d - atypical coverage F/u new cultures from 2/15, duration of therapy, improvement  Height: 6\' 6"  (198.1 cm) Weight: 94.6 kg (208 lb 8.9 oz) IBW/kg (Calculated) : 91.4  Temp (24hrs), Avg:98.1 F (36.7 C), Min:97.5 F (36.4 C), Max:98.7 F (37.1 C)  Recent Labs  Lab 07/07/23 2144 07/08/23 0154 07/09/23 0841 07/10/23 0525 07/10/23 1308 07/11/23 0304  WBC 7.8 7.4 7.5 7.3  --  7.8  CREATININE 1.07 0.98 1.32* 1.13  --  1.21  LATICACIDVEN  --  1.5  --   --  1.4  --     Estimated Creatinine Clearance: 94.4 mL/min (by C-G formula based on SCr of 1.21 mg/dL).    No Known Allergies  Antimicrobials this admission: Ceftriaxone 2/12 >> 2/15 Azithro 2/12 >> Pip/tazo 2/15 >>  Microbiology results: Legionella Ag neg, Strep PNA Ag neg 2/12 Ucx: ngF 2/13 Bcx: ng3d  2/12 Bcx: ng3d  2/13 pleural fluid cx: ng2d  2/14 MRSA: Neg  Thank you for allowing pharmacy to be a part of this patient's care.  Rutherford Nail, PharmD PGY2 Critical Care Pharmacy Resident 07/11/2023 10:26 AM

## 2023-07-11 NOTE — Plan of Care (Signed)
  Problem: Education: Goal: Knowledge of General Education information will improve Description: Including pain rating scale, medication(s)/side effects and non-pharmacologic comfort measures Outcome: Progressing   Problem: Nutrition: Goal: Adequate nutrition will be maintained Outcome: Progressing   Problem: Activity: Goal: Risk for activity intolerance will decrease Outcome: Progressing   Problem: Coping: Goal: Level of anxiety will decrease Outcome: Progressing   Problem: Elimination: Goal: Will not experience complications related to bowel motility Outcome: Progressing Goal: Will not experience complications related to urinary retention Outcome: Progressing   Problem: Pain Managment: Goal: General experience of comfort will improve and/or be controlled Outcome: Progressing   Problem: Safety: Goal: Ability to remain free from injury will improve Outcome: Progressing   Problem: Skin Integrity: Goal: Risk for impaired skin integrity will decrease Outcome: Progressing   Problem: Activity: Goal: Ability to tolerate increased activity will improve Outcome: Progressing   Problem: Respiratory: Goal: Ability to maintain adequate ventilation will improve Outcome: Progressing Goal: Ability to maintain a clear airway will improve Outcome: Progressing

## 2023-07-11 NOTE — Progress Notes (Signed)
 Pharmacy Consult for Heparin Indication: pulmonary embolus  No Known Allergies  Patient Measurements: Height: 6\' 6"  (198.1 cm) Weight: 94.6 kg (208 lb 8.9 oz) IBW/kg (Calculated) : 91.4 HEPARIN DW (KG): 106.6  Vital Signs: Temp: 98.7 F (37.1 C) (02/15 0752) Temp Source: Oral (02/15 0752) BP: 119/97 (02/15 0900) Pulse Rate: 115 (02/15 0900)  Labs: Recent Labs    07/09/23 0841 07/10/23 0525 07/10/23 1718 07/10/23 2208 07/11/23 0304  HGB 12.4* 13.2  --   --  13.6  HCT 36.7* 39.7  --   --  41.3  PLT 336 306  --   --  301  APTT  --   --  91*  --   --   HEPARINUNFRC  --   --  0.79* 0.65 0.44  CREATININE 1.32* 1.13  --   --  1.21    Estimated Creatinine Clearance: 94.4 mL/min (by C-G formula based on SCr of 1.21 mg/dL).  Assessment: Anthony Singleton a 53 y.o. male on heparin for R atrial thrombus and CT scan showing L segmental PE. No AC PTA.  Heparin level therapeutic 0.44 with stable CBC, no bleeding or issues noted.  Goal of Therapy:  Heparin level 0.3-0.7 units/ml Monitor platelets by anticoagulation protocol: Yes   Plan:  Continue heparin infusion at 1800 units/ hour  Daily heparin level and PRN Monitor CBC, s/sx bleeding Eventual transition to oral  Rutherford Nail, PharmD PGY2 Critical Care Pharmacy Resident 07/11/2023 10:21 AM

## 2023-07-11 NOTE — Plan of Care (Addendum)
 Patient remains on MC-61M at time of writing. Chest tube to right side to -20 cm suction. Patient continues to receive a cont [place] infusion of heparin via PIV. Patient has been weaned to room air only (no supplemental O2 requirement). Patient declines SCDs at this time. Patient's admission profile completed overnight by this RN.    Problem: Education: Goal: Knowledge of General Education information will improve Description: Including pain rating scale, medication(s)/side effects and non-pharmacologic comfort measures Outcome: Progressing   Problem: Health Behavior/Discharge Planning: Goal: Ability to manage health-related needs will improve Outcome: Progressing   Problem: Clinical Measurements: Goal: Ability to maintain clinical measurements within normal limits will improve Outcome: Progressing Goal: Will remain free from infection Outcome: Progressing Goal: Diagnostic test results will improve Outcome: Progressing Goal: Respiratory complications will improve Outcome: Progressing Goal: Cardiovascular complication will be avoided Outcome: Progressing   Problem: Activity: Goal: Risk for activity intolerance will decrease Outcome: Progressing   Problem: Nutrition: Goal: Adequate nutrition will be maintained Outcome: Progressing   Problem: Coping: Goal: Level of anxiety will decrease Outcome: Progressing   Problem: Elimination: Goal: Will not experience complications related to bowel motility Outcome: Progressing Goal: Will not experience complications related to urinary retention Outcome: Progressing   Problem: Pain Managment: Goal: General experience of comfort will improve and/or be controlled Outcome: Progressing   Problem: Safety: Goal: Ability to remain free from injury will improve Outcome: Progressing   Problem: Skin Integrity: Goal: Risk for impaired skin integrity will decrease Outcome: Progressing   Problem: Activity: Goal: Ability to tolerate increased  activity will improve Outcome: Progressing   Problem: Clinical Measurements: Goal: Ability to maintain a body temperature in the normal range will improve Outcome: Progressing   Problem: Respiratory: Goal: Ability to maintain adequate ventilation will improve Outcome: Progressing Goal: Ability to maintain a clear airway will improve Outcome: Progressing   Problem: Education: Goal: Ability to demonstrate management of disease process will improve Outcome: Progressing Goal: Ability to verbalize understanding of medication therapies will improve Outcome: Progressing Goal: Individualized Educational Video(s) Outcome: Progressing   Problem: Activity: Goal: Capacity to carry out activities will improve Outcome: Progressing   Problem: Cardiac: Goal: Ability to achieve and maintain adequate cardiopulmonary perfusion will improve Outcome: Progressing   Problem: Activity: Goal: Ability to tolerate increased activity will improve Outcome: Progressing   Problem: Clinical Measurements: Goal: Ability to maintain a body temperature in the normal range will improve Outcome: Progressing   Problem: Respiratory: Goal: Ability to maintain adequate ventilation will improve Outcome: Progressing Goal: Ability to maintain a clear airway will improve Outcome: Progressing

## 2023-07-11 NOTE — Progress Notes (Signed)
 Rounding Note    Patient Name: Anthony Singleton Date of Encounter: 07/11/2023  [facility] Cardiologist: New  Subjective   SOB improving.   Inpatient Medications    Scheduled Meds:  Chlorhexidine Gluconate Cloth  6 each Topical Daily   feeding supplement  237 mL Oral TID BM   furosemide  40 mg Intravenous BID   lidocaine  1 patch Transdermal Q24H   LORazepam  2 mg Intravenous Once   polyethylene glycol  17 g Oral Daily   senna-docusate  2 tablet Oral QHS   sodium chloride flush  10 mL Intrapleural Q8H   Continuous Infusions:  azithromycin 500 mg (07/11/23 1107)   heparin 1,800 Units/hr (07/11/23 0554)   piperacillin-tazobactam (ZOSYN)  IV 3.375 g (07/11/23 1315)   PRN Meds: acetaminophen **OR** acetaminophen, cyclobenzaprine, haloperidol lactate, HYDROcodone bit-homatropine, HYDROmorphone (DILAUDID) injection, hydrOXYzine, melatonin, ondansetron (ZOFRAN) IV, mouth rinse, oxyCODONE   Vital Signs    Vitals:   07/11/23 0900 07/11/23 1000 07/11/23 1100 07/11/23 1200  BP: (!) 119/97 (!) 116/92 (!) 116/92   Pulse: (!) 115 (!) 114 (!) 117 (!) 134  Resp: (!) 22 18 (!) 25 (!) 31  Temp:      TempSrc:      SpO2: 95% 95% 95% (!) 86%  Weight:      Height:        Intake/Output Summary (Last 24 hours) at 07/11/2023 1345 Last data filed at 07/11/2023 0830 Gross per 24 hour  Intake 1709.99 ml  Output 3300 ml  Net -1590.01 ml      07/11/2023    5:00 AM 07/10/2023    8:42 AM 07/09/2023   11:56 PM  Last 3 Weights  Weight (lbs) 208 lb 8.9 oz 221 lb 9 oz 222 lb 0.1 oz  Weight (kg) 94.6 kg 100.5 kg 100.7 kg      Telemetry    Sinus tach - Personally Reviewed  ECG    N/a - Personally Reviewed  Physical Exam   GEN: No acute distress.   Neck: No JVD Cardiac: Rregular, tachy Respiratory: Clear to auscultation bilaterally. GI: Soft, nontender, non-distended  MS: No edema; No deformity. Neuro:  Nonfocal  Psych: Normal affect   Labs    High  Sensitivity Troponin:   Recent Labs  Lab 07/07/23 2144 07/07/23 2345  TROPONINIHS 26* 21*     Chemistry Recent Labs  Lab 07/08/23 0154 07/09/23 0841 07/09/23 1714 07/10/23 0525 07/11/23 0304  NA 137 133*  --  134* 134*  K 4.3 3.5  --  3.6 4.1  CL 102 100  --  98 98  CO2 20* 23  --  25 26  GLUCOSE 118* 155*  --  130* 130*  BUN 13 15  --  14 14  CREATININE 0.98 1.32*  --  1.13 1.21  CALCIUM 8.7* 8.1*  --  8.0* 8.1*  MG 1.9 1.8  --   --   --   PROT 6.5 6.7  --   --   --   ALBUMIN 2.4* 2.3* 2.3*  --   --   AST 32 41  --   --   --   ALT 31 38  --   --   --   ALKPHOS 72 83  --   --   --   BILITOT 1.7* 1.2  --   --   --   GFRNONAA >60 >60  --  >60 >60  ANIONGAP 15 10  --  11 10  Lipids No results for input(s): "CHOL", "TRIG", "HDL", "LABVLDL", "LDLCALC", "CHOLHDL" in the last 168 hours.  Hematology Recent Labs  Lab 07/09/23 0841 07/10/23 0525 07/11/23 0304  WBC 7.5 7.3 7.8  RBC 3.75* 4.08* 4.21*  HGB 12.4* 13.2 13.6  HCT 36.7* 39.7 41.3  MCV 97.9 97.3 98.1  MCH 33.1 32.4 32.3  MCHC 33.8 33.2 32.9  RDW 13.6 13.5 13.7  PLT 336 306 301   Thyroid  Recent Labs  Lab 07/08/23 0154  TSH 1.409  FREET4 1.52*    BNP Recent Labs  Lab 07/07/23 2144 07/09/23 0841  BNP 1,504.8* 1,055.2*    DDimer  Recent Labs  Lab 07/07/23 2144  DDIMER 18.01*     Radiology    CT Angio Chest Pulmonary Embolism (PE) W or WO Contrast Result Date: 07/10/2023 CLINICAL DATA:  Pulmonary embolism (PE) suspected, high prob atrial thrombus. TEE with clot-in-transit. EXAM: CT ANGIOGRAPHY CHEST WITH CONTRAST TECHNIQUE: Multidetector CT imaging of the chest was performed using the standard protocol during bolus administration of intravenous contrast. Multiplanar CT image reconstructions and MIPs were obtained to evaluate the vascular anatomy. RADIATION DOSE REDUCTION: This exam was performed according to the departmental dose-optimization program which includes automated exposure control,  adjustment of the mA and/or kV according to patient size and/or use of iterative reconstruction technique. CONTRAST:  75mL OMNIPAQUE IOHEXOL 350 MG/ML SOLN COMPARISON:  CTA PE, 07/08/2023. Chest XR, 07/09/2023. Echocardiogram, earlier same day. FINDINGS: Cardiovascular: Satisfactory opacification of the pulmonary arteries to the segmental level. Pulmonary embolus, at the LEFT pulmonary arterial bifurcation. Cardiomegaly with multi chamber cardiac enlargement, most prominently within the LEFT ventricle. No pericardial effusion. RV/LV ratio of 0.7. Mediastinum/Nodes: No enlarged mediastinal, hilar, or axillary lymph nodes. Thyroid gland, trachea, and esophagus demonstrate no significant findings. Lungs/Pleura: Pulmonary interstitial thickening, greater on RIGHT small volume RIGHT pleural effusion. Trace RIGHT pneumothorax with basilar pigtail thoracostomy tube. RIGHT dependent basilar consolidation. The LEFT lung is relatively clear. Upper Abdomen: No acute abnormality. 2.0 x 1.2 cm low density LEFT adrenal lesion, measuring -2 HU. Punctate LEFT renal calculus, incompletely imaged. Musculoskeletal: Stranding with relative obliteration of the normal fat planes along the RIGHT jugular sheath extending to the thoracic inlet. No acute osseous findings. Review of the MIP images confirms the above findings. IMPRESSION: 1. Examination is POSITIVE for LEFT segmental pulmonary emboli, at the pulmonary arterial bifurcation. RV/LV ratio of 0.7. Findings suggest interval embolization of prior clot-in-transit seen on comparison echocardiogram. 2. Trace RIGHT pneumothorax with basilar pigtail thoracostomy tube. 3. Small volume RIGHT pleural effusion. Dependent pulmonary consolidation is likely to represent atelectasis, however superimposed pneumonia can appear similar. 4. Fat stranding along the RIGHT jugular sheath, correlate for potential recent cannulation attempt. 5. 2 cm low-density LEFT adrenal mass, consistent with lipid-rich  benign adenoma. No follow-up imaging is recommended. JACR 2017 Aug; 14(8):1038-44, JCAT 2016 Mar-Apr; 40(2):194-200, Urol J 2006 Spring; 3(2):71-4. 6. Cardiomegaly with LEFT ventricular enlargement. Additional incidental, chronic and senescent findings as above. These results were called by telephone at the time of interpretation on 07/10/2023 at 2:50 p.m. to provider [facility] , who verbally acknowledged these results. Roanna Banning, MD [facility] Electronically Signed   By: Roanna Banning M.D.   On: 07/10/2023 16:10   ECHOCARDIOGRAM COMPLETE Result Date: 07/10/2023    ECHOCARDIOGRAM REPORT   Patient Name:   AARIT KASHUBA Date of Exam: 07/10/2023 Medical Rec #:  [id number]  Height:       78.0 in Accession #:    [id number]            Weight:       222.0 lb Date of Birth:  [birth year]             BSA:          2.360 m Patient Age:    52 years              BP:           117/85 mmHg Patient Gender: M                     HR:           126 bpm. Exam Location:  Inpatient Procedure: 2D Echo, Cardiac Doppler, Color Doppler and Intracardiac            Opacification Agent (Both Spectral and Color Flow Doppler were            utilized during procedure). Indications:    CHF-Acute Diastolic I50.31  History:        Patient has no prior history of Echocardiogram examinations.  Sonographer:    Darlys Gales Referring Phys: [id number] DANIEL V THOMPSON IMPRESSIONS  1. 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 Intermittently crossing tricuspid valve.  2. Left ventricular ejection fraction, by estimation, is 15%. The left ventricle has severely decreased function. The left ventricle demonstrates global hypokinesis. The left ventricular internal cavity size was moderately dilated. There is mild left ventricular hypertrophy of the infero-lateral segment. Left ventricular diastolic parameters are indeterminate.  3. Right ventricular systolic function  is moderately reduced. The right ventricular size is mildly enlarged.  4. Left atrial size was mildly dilated.  5. The mitral valve is grossly normal. Mild mitral valve regurgitation. No evidence of mitral stenosis.  6. The aortic valve is grossly normal. Aortic valve regurgitation is not visualized. No aortic stenosis is present. Conclusion(s)/Recommendation(s): No left ventricular mural or apical thrombus/thrombi. Critical findings reported to Dr. Jerral Ralph and acknowledged at 07/10/23 11:05 am. FINDINGS  Left Ventricle: Left ventricular ejection fraction, by estimation, is 15%. The left ventricle has severely decreased function. The left ventricle demonstrates global hypokinesis. The left ventricular internal cavity size was moderately dilated. There is  mild left ventricular hypertrophy of the infero-lateral segment. Left ventricular diastolic parameters are indeterminate. Right Ventricle: The right ventricular size is mildly enlarged. No increase in right ventricular wall thickness. Right ventricular systolic function is moderately reduced. Left Atrium: Left atrial size was mildly dilated. Right Atrium: Right atrial size was normal in size. Pericardium: Trivial pericardial effusion is present. Mitral Valve: The mitral valve is grossly normal. Mild mitral valve regurgitation. No evidence of mitral valve stenosis. Tricuspid Valve: The tricuspid valve is normal in structure. Tricuspid valve regurgitation is mild . No evidence of tricuspid stenosis. Aortic Valve: The aortic valve is grossly normal. Aortic valve regurgitation is not visualized. No aortic stenosis is present. Aortic valve mean gradient measures 2.0 mmHg. Aortic valve peak gradient measures 3.2 mmHg. Aortic valve area, by VTI measures 3.40 cm. Pulmonic Valve: The pulmonic valve was normal in structure. Pulmonic valve regurgitation is trivial. No evidence of pulmonic stenosis. Aorta: The aortic root is normal in size and structure. Venous: The inferior  vena cava was not well visualized. IAS/Shunts: No atrial level shunt detected by color flow Doppler.  LEFT VENTRICLE PLAX 2D LVIDd:  6.30 cm   Diastology LVIDs:         5.90 cm   LV e' medial:    6.20 cm/s LV PW:         1.10 cm   LV E/e' medial:  11.7 LV IVS:        0.80 cm   LV e' lateral:   14.00 cm/s LVOT diam:     2.10 cm   LV E/e' lateral: 5.2 LV SV:         36 LV SV Index:   15 LVOT Area:     3.46 cm  RIGHT VENTRICLE RV S prime:     5.87 cm/s TAPSE (M-mode): 0.7 cm LEFT ATRIUM             Index        RIGHT ATRIUM           Index LA Vol (A2C):   54.4 ml 23.05 ml/m  RA Area:     20.00 cm LA Vol (A4C):   94.8 ml 40.17 ml/m  RA Volume:   61.10 ml  25.89 ml/m LA Biplane Vol: 78.8 ml 33.39 ml/m  AORTIC VALVE AV Area (Vmax):    2.92 cm AV Area (Vmean):   2.76 cm AV Area (VTI):     3.40 cm AV Vmax:           89.90 cm/s AV Vmean:          70.700 cm/s AV VTI:            0.106 m AV Peak Grad:      3.2 mmHg AV Mean Grad:      2.0 mmHg LVOT Vmax:         75.80 cm/s LVOT Vmean:        56.400 cm/s LVOT VTI:          0.104 m LVOT/AV VTI ratio: 0.98  AORTA Ao Root diam: 3.20 cm MITRAL VALVE               TRICUSPID VALVE MV Area (PHT): 4.52 cm    TR Peak grad:   25.2 mmHg MV Decel Time: 168 msec    TR Vmax:        251.00 cm/s MV E velocity: 72.30 cm/s                            SHUNTS                            Systemic VTI:  0.10 m                            Systemic Diam: 2.10 cm Weston Brass MD Electronically signed by Weston Brass MD Signature Date/Time: 07/10/2023/11:13:31 AM    Final    DG Chest Port 1 View Result Date: 07/09/2023 CLINICAL DATA:  [id number] Encounter for chest tube placement [id number]. EXAM: PORTABLE CHEST 1 VIEW COMPARISON:  07/07/2023. FINDINGS: Low lung volume. There are heterogeneous opacities at the right lung base, overall decreased since the prior study. Mild diffuse pulmonary vascular congestion noted. No frank pulmonary edema. Left lung is grossly clear. Left lateral  costophrenic angle is clear. Blunting of right lateral costophrenic angle is likely secondary to combination of pleural effusion and lung parenchymal opacity. Interval placement of right-sided pleural drainage catheter. No pneumothorax. Stable cardio-mediastinal silhouette.  No acute osseous abnormalities. The soft tissues are within normal limits. IMPRESSION: 1. Mild diffuse pulmonary vascular congestion without frank pulmonary edema. 2. Heterogeneous opacities in the right lung base overall decreased since the prior study. Findings may represent combination of consolidation and/or atelectasis. Blunting of right lateral costophrenic angle may be due to combination of pleural effusion and lung parenchymal opacity. 3. Interval placement of right-sided pleural drainage catheter. No pneumothorax. Overall, there is significant interval decrease in the right pleural effusion. Electronically Signed   By: Jules Schick M.D.   On: 07/09/2023 16:43    Cardiac Studies     Patient Profile     Leary Mcnulty is a 53 y.o. male with a hx of remote traumatic brain injury, recent pneumonia who is being seen 07/10/2023 for the evaluation of new cardiomyopathy at the request of Dr. Jerral Ralph.   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   - he is on IV lasix 40mg  bid. Negative 1.7 L yesterday and 5.4 L since admissoin. Avoid over aggressive diuresis with acute PE. Renal function is stable.  - medical therapy limited by soft bp's. Avoid aggressive bp agents in setting of acute PE.  - consider cath when recovered from PE and pneumonia, I think likely will be outpatient  2. PE - per primary team - he is on hep gtt   3.Pneumonia - per primary team For questions or updates, please contact [facility] Please consult [email or web address] for contact info under        Signed, Dina Rich, MD  07/11/2023, 1:45 PM

## 2023-07-11 NOTE — Progress Notes (Signed)
 VASCULAR LAB    Bilateral lower extremity venous duplex has been performed.  See CV proc for preliminary results.   Kani Chauvin, RVT 07/11/2023, 4:21 PM

## 2023-07-12 DIAGNOSIS — J90 Pleural effusion, not elsewhere classified: Secondary | ICD-10-CM | POA: Diagnosis not present

## 2023-07-12 DIAGNOSIS — I2699 Other pulmonary embolism without acute cor pulmonale: Secondary | ICD-10-CM | POA: Diagnosis not present

## 2023-07-12 DIAGNOSIS — I5082 Biventricular heart failure: Secondary | ICD-10-CM | POA: Diagnosis not present

## 2023-07-12 LAB — CBC
HCT: 39.7 % (ref 39.0–52.0)
Hemoglobin: 13.4 g/dL (ref 13.0–17.0)
MCH: 32.7 pg (ref 26.0–34.0)
MCHC: 33.8 g/dL (ref 30.0–36.0)
MCV: 96.8 fL (ref 80.0–100.0)
Platelets: 308 10*3/uL (ref 150–400)
RBC: 4.1 MIL/uL — ABNORMAL LOW (ref 4.22–5.81)
RDW: 13.5 % (ref 11.5–15.5)
WBC: 7 10*3/uL (ref 4.0–10.5)
nRBC: 0 % (ref 0.0–0.2)

## 2023-07-12 LAB — BASIC METABOLIC PANEL
Anion gap: 10 (ref 5–15)
BUN: 19 mg/dL (ref 6–20)
CO2: 29 mmol/L (ref 22–32)
Calcium: 8.2 mg/dL — ABNORMAL LOW (ref 8.9–10.3)
Chloride: 97 mmol/L — ABNORMAL LOW (ref 98–111)
Creatinine, Ser: 1.35 mg/dL — ABNORMAL HIGH (ref 0.61–1.24)
GFR, Estimated: >60 mL/min (ref >60)
Glucose, Bld: 106 mg/dL — ABNORMAL HIGH (ref 70–99)
Potassium: 3.8 mmol/L (ref 3.5–5.1)
Sodium: 136 mmol/L (ref 135–145)

## 2023-07-12 LAB — HEPARIN LEVEL (UNFRACTIONATED)
Heparin Unfractionated: 0.25 [IU]/mL — ABNORMAL LOW (ref 0.30–0.70)
Heparin Unfractionated: 0.33 [IU]/mL (ref 0.30–0.70)

## 2023-07-12 LAB — MAGNESIUM: Magnesium: 2 mg/dL (ref 1.7–2.4)

## 2023-07-12 LAB — EXPECTORATED SPUTUM ASSESSMENT W GRAM STAIN, RFLX TO RESP C

## 2023-07-12 LAB — CHOLESTEROL, BODY FLUID: Cholesterol, Fluid: 36 mg/dL

## 2023-07-12 LAB — GLUCOSE, CAPILLARY: Glucose-Capillary: 133 mg/dL — ABNORMAL HIGH (ref 70–99)

## 2023-07-12 LAB — PHOSPHORUS: Phosphorus: 3.6 mg/dL (ref 2.5–4.6)

## 2023-07-12 MED ORDER — SODIUM CHLORIDE 0.9 % IV SOLN: INTRAVENOUS | Status: AC | PRN

## 2023-07-12 NOTE — Plan of Care (Signed)
 Patient remains on [facility]-1M / MICU at time of writing; now PCU level-of-care (awaiting transfer). Patient is requiring 3 L / min of supplemental O2 via [place]. Patient remains on continuous infusion of IV Heparin.    Problem: Education: Goal: Knowledge of General Education information will improve Description: Including pain rating scale, medication(s)/side effects and non-pharmacologic comfort measures Outcome: Not Progressing   Problem: Health Behavior/Discharge Planning: Goal: Ability to manage health-related needs will improve Outcome: Not Progressing   Problem: Clinical Measurements: Goal: Ability to maintain clinical measurements within normal limits will improve Outcome: Not Progressing Goal: Will remain free from infection Outcome: Not Progressing Goal: Diagnostic test results will improve Outcome: Not Progressing Goal: Respiratory complications will improve Outcome: Not Progressing Goal: Cardiovascular complication will be avoided Outcome: Not Progressing   Problem: Activity: Goal: Risk for activity intolerance will decrease Outcome: Not Progressing   Problem: Nutrition: Goal: Adequate nutrition will be maintained Outcome: Not Progressing   Problem: Coping: Goal: Level of anxiety will decrease Outcome: Not Progressing   Problem: Elimination: Goal: Will not experience complications related to bowel motility Outcome: Not Progressing Goal: Will not experience complications related to urinary retention Outcome: Not Progressing   Problem: Pain Managment: Goal: General experience of comfort will improve and/or be controlled Outcome: Not Progressing   Problem: Safety: Goal: Ability to remain free from injury will improve Outcome: Not Progressing   Problem: Skin Integrity: Goal: Risk for impaired skin integrity will decrease Outcome: Not Progressing   Problem: Activity: Goal: Ability to tolerate increased activity will improve Outcome: Not Progressing   Problem:  Clinical Measurements: Goal: Ability to maintain a body temperature in the normal range will improve Outcome: Not Progressing   Problem: Respiratory: Goal: Ability to maintain adequate ventilation will improve Outcome: Not Progressing Goal: Ability to maintain a clear airway will improve Outcome: Not Progressing   Problem: Education: Goal: Ability to demonstrate management of disease process will improve Outcome: Not Progressing Goal: Ability to verbalize understanding of medication therapies will improve Outcome: Not Progressing Goal: Individualized Educational Video(s) Outcome: Not Progressing   Problem: Activity: Goal: Capacity to carry out activities will improve Outcome: Not Progressing   Problem: Cardiac: Goal: Ability to achieve and maintain adequate cardiopulmonary perfusion will improve Outcome: Not Progressing   Problem: Activity: Goal: Ability to tolerate increased activity will improve Outcome: Not Progressing   Problem: Clinical Measurements: Goal: Ability to maintain a body temperature in the normal range will improve Outcome: Not Progressing   Problem: Respiratory: Goal: Ability to maintain adequate ventilation will improve Outcome: Not Progressing Goal: Ability to maintain a clear airway will improve Outcome: Not Progressing

## 2023-07-12 NOTE — Progress Notes (Signed)
 Rounding Note    Patient Name: Anthony Singleton Date of Encounter: 07/12/2023  [facility] Cardiologist: New  Subjective   SOB is improving  Inpatient Medications    Scheduled Meds:  Chlorhexidine Gluconate Cloth  6 each Topical Daily   feeding supplement  237 mL Oral TID BM   furosemide  40 mg Intravenous BID   lidocaine  1 patch Transdermal Q24H   LORazepam  2 mg Intravenous Once   polyethylene glycol  17 g Oral Daily   senna-docusate  2 tablet Oral QHS   sodium chloride flush  10 mL Intrapleural Q8H   Continuous Infusions:  sodium chloride Stopped (07/12/23 0529)   azithromycin 500 mg (07/12/23 0936)   heparin 1,900 Units/hr (07/12/23 0830)   piperacillin-tazobactam (ZOSYN)  IV 12.5 mL/hr at 07/12/23 0800   PRN Meds: sodium chloride, acetaminophen **OR** acetaminophen, cyclobenzaprine, haloperidol lactate, HYDROcodone bit-homatropine, HYDROmorphone (DILAUDID) injection, hydrOXYzine, melatonin, ondansetron (ZOFRAN) IV, mouth rinse, oxyCODONE   Vital Signs    Vitals:   07/12/23 0804 07/12/23 0805 07/12/23 0806 07/12/23 0807  BP:    127/82  Pulse: (!) 126 (!) 128 (!) 129 (!) 137  Resp: 13 14 19  (!) 22  Temp:      TempSrc:      SpO2:    92%  Weight:      Height:        Intake/Output Summary (Last 24 hours) at 07/12/2023 0959 Last data filed at 07/12/2023 0914 Gross per 24 hour  Intake 1830.72 ml  Output 4335 ml  Net -2504.28 ml      07/12/2023    4:00 AM 07/11/2023    5:00 AM 07/10/2023    8:42 AM  Last 3 Weights  Weight (lbs) 203 lb 14.8 oz 208 lb 8.9 oz 221 lb 9 oz  Weight (kg) 92.5 kg 94.6 kg 100.5 kg      Telemetry    Sinus tach - Personally Reviewed  ECG    N/a - Personally Reviewed  Physical Exam   GEN: No acute distress.   Neck: No JVD Cardiac: RRR, no murmurs, rubs, or gallops.  Respiratory:coarse bilateral lung bases GI: Soft, nontender, non-distended  MS: No edema; No deformity. Neuro:  Nonfocal  Psych: Normal  affect   Labs    High Sensitivity Troponin:   Recent Labs  Lab 07/07/23 2144 07/07/23 2345  TROPONINIHS 26* 21*     Chemistry Recent Labs  Lab 07/08/23 0154 07/09/23 0841 07/09/23 1714 07/10/23 0525 07/11/23 0304 07/11/23 2000 07/12/23 0401  NA 137 133*  --  134* 134*  --  136  K 4.3 3.5  --  3.6 4.1  --  3.8  CL 102 100  --  98 98  --  97*  CO2 20* 23  --  25 26  --  29  GLUCOSE 118* 155*  --  130* 130*  --  106*  BUN 13 15  --  14 14  --  19  CREATININE 0.98 1.32*  --  1.13 1.21  --  1.35*  CALCIUM 8.7* 8.1*  --  8.0* 8.1*  --  8.2*  MG 1.9 1.8  --   --   --   --  2.0  PROT 6.5 6.7  --   --   --  6.9  --   ALBUMIN 2.4* 2.3* 2.3*  --   --  2.2*  --   AST 32 41  --   --   --   --   --  ALT 31 38  --   --   --   --   --   ALKPHOS 72 83  --   --   --   --   --   BILITOT 1.7* 1.2  --   --   --   --   --   GFRNONAA >60 >60  --  >60 >60  --  >60  ANIONGAP 15 10  --  11 10  --  10    Lipids No results for input(s): "CHOL", "TRIG", "HDL", "LABVLDL", "LDLCALC", "CHOLHDL" in the last 168 hours.  Hematology Recent Labs  Lab 07/10/23 0525 07/11/23 0304 07/12/23 0401  WBC 7.3 7.8 7.0  RBC 4.08* 4.21* 4.10*  HGB 13.2 13.6 13.4  HCT 39.7 41.3 39.7  MCV 97.3 98.1 96.8  MCH 32.4 32.3 32.7  MCHC 33.2 32.9 33.8  RDW 13.5 13.7 13.5  PLT 306 301 308   Thyroid  Recent Labs  Lab 07/08/23 0154  TSH 1.409  FREET4 1.52*    BNP Recent Labs  Lab 07/07/23 2144 07/09/23 0841  BNP 1,504.8* 1,055.2*    DDimer  Recent Labs  Lab 07/07/23 2144  DDIMER 18.01*     Radiology    VAS [place] UPPER EXTREMITY VENOUS DUPLEX Result Date: 07/11/2023 UPPER VENOUS STUDY  Patient Name:  Anthony Singleton  Date of Exam:   07/11/2023 Medical Rec #: [id number]              Accession #:    [id number] Date of Birth: [birth year]              Patient Gender: M Patient Age:   25 years Exam Location:  [facility] Procedure:      VAS [place] UPPER EXTREMITY VENOUS DUPLEX Referring Phys:  Melody Comas --------------------------------------------------------------------------------  Indications: Pulmonary embolism. RN noted bulging area in right upper shoulder/neck area. Bedside POC ultrasound demonstrated thrombus. Comparison Study: No prior study on file Performing Technologist: Sherren Kerns RVS  Examination Guidelines: A complete evaluation includes B-mode imaging, spectral Doppler, color Doppler, and power Doppler as needed of all accessible portions of each vessel. Bilateral testing is considered an integral part of a complete examination. Limited examinations for reoccurring indications may be performed as noted.  Right Findings: +----------+------------+---------+-----------+----------+-------+ RIGHT     CompressiblePhasicitySpontaneousPropertiesSummary +----------+------------+---------+-----------+----------+-------+ IJV           None       No        No                Acute  +----------+------------+---------+-----------+----------+-------+ Subclavian    None       No        No                Acute  +----------+------------+---------+-----------+----------+-------+ Axillary    Partial      No        No                Acute  +----------+------------+---------+-----------+----------+-------+ Brachial      Full       No        No                       +----------+------------+---------+-----------+----------+-------+ Radial        Full       No        No                       +----------+------------+---------+-----------+----------+-------+  Ulnar         Full       No        No                       +----------+------------+---------+-----------+----------+-------+ Cephalic      None                                   Acute  +----------+------------+---------+-----------+----------+-------+ Basilic     Partial      No        No                Acute  +----------+------------+---------+-----------+----------+-------+  Left  Findings: +----------+------------+---------+-----------+----------+-------+ LEFT      CompressiblePhasicitySpontaneousPropertiesSummary +----------+------------+---------+-----------+----------+-------+ IJV           Full       Yes       Yes                      +----------+------------+---------+-----------+----------+-------+ Subclavian    Full       Yes       Yes                      +----------+------------+---------+-----------+----------+-------+ Axillary                 Yes       Yes                      +----------+------------+---------+-----------+----------+-------+ Brachial      Full       Yes       Yes                      +----------+------------+---------+-----------+----------+-------+ Radial        Full                                          +----------+------------+---------+-----------+----------+-------+ Ulnar         Full                                          +----------+------------+---------+-----------+----------+-------+ Cephalic      None                                   Acute  +----------+------------+---------+-----------+----------+-------+ Basilic       None                                   Acute  +----------+------------+---------+-----------+----------+-------+  Summary:  Right: Findings consistent with acute deep vein thrombosis involving the right internal jugular vein, right subclavian vein and right axillary vein. Findings consistent with acute superficial vein thrombosis involving the right basilic vein and right cephalic vein. Thrombus noted in external jugular vein and in superficial vessels at site of concern in upper shoulder/neck area. Continuous waveforms noted in the brachial, radial, and ulnar veins secondary to more proximal thrombus.  Left: No evidence of deep vein thrombosis in the upper extremity. Findings  consistent with acute superficial vein thrombosis involving the left basilic vein and left  cephalic vein.  *See table(s) above for measurements and observations.  Diagnosing physician: Carolynn Sayers Electronically signed by Carolynn Sayers on 07/11/2023 at 8:18:47 PM.    Final    VAS [place] LOWER EXTREMITY VENOUS (DVT) Result Date: 07/11/2023  Lower Venous DVT Study Patient Name:  VIRGAL WARMUTH  Date of Exam:   07/11/2023 Medical Rec #: [id number]              Accession #:    [id number] Date of Birth: May 09, 1971              Patient Gender: M Patient Age:   82 years Exam Location:  [facility] Procedure:      VAS [place] LOWER EXTREMITY VENOUS (DVT) Referring Phys: Jeoffrey Massed --------------------------------------------------------------------------------  Indications: Pulmonary embolism.  Comparison Study: No prior study on file Performing Technologist: Sherren Kerns RVS  Examination Guidelines: A complete evaluation includes B-mode imaging, spectral Doppler, color Doppler, and power Doppler as needed of all accessible portions of each vessel. Bilateral testing is considered an integral part of a complete examination. Limited examinations for reoccurring indications may be performed as noted. The reflux portion of the exam is performed with the patient in reverse Trendelenburg.  +---------+---------------+---------+-----------+----------+-------------------+ RIGHT    CompressibilityPhasicitySpontaneityPropertiesThrombus Aging      +---------+---------------+---------+-----------+----------+-------------------+ CFV      Full           Yes      No                                       +---------+---------------+---------+-----------+----------+-------------------+ SFJ      Full           Yes      No                                       +---------+---------------+---------+-----------+----------+-------------------+ FV Prox  Full           No       No                                       +---------+---------------+---------+-----------+----------+-------------------+  FV Mid   Full                                                             +---------+---------------+---------+-----------+----------+-------------------+ FV DistalFull           Yes      Yes                                      +---------+---------------+---------+-----------+----------+-------------------+ PFV      Full           No       No                                       +---------+---------------+---------+-----------+----------+-------------------+  POP      Full           Yes      Yes                                      +---------+---------------+---------+-----------+----------+-------------------+ PTV      Full                                                             +---------+---------------+---------+-----------+----------+-------------------+ PERO     Full                                                             +---------+---------------+---------+-----------+----------+-------------------+ Gastroc  Full                                                             +---------+---------------+---------+-----------+----------+-------------------+ EIV                     Yes      No                   patent by color and                                                       Doppler             +---------+---------------+---------+-----------+----------+-------------------+ CIV Dis                 Yes      No                   patent by color and                                                       Doppler             +---------+---------------+---------+-----------+----------+-------------------+   +---------+---------------+---------+-----------+----------+--------------+ LEFT     CompressibilityPhasicitySpontaneityPropertiesThrombus Aging +---------+---------------+---------+-----------+----------+--------------+ CFV      Full           Yes      No                                   +---------+---------------+---------+-----------+----------+--------------+ SFJ      Full                                                        +---------+---------------+---------+-----------+----------+--------------+  FV Prox  Full                                                        +---------+---------------+---------+-----------+----------+--------------+ FV Mid   Full                                                        +---------+---------------+---------+-----------+----------+--------------+ FV DistalFull                                                        +---------+---------------+---------+-----------+----------+--------------+ PFV      Full                                                        +---------+---------------+---------+-----------+----------+--------------+ POP      Full           No       Yes                                 +---------+---------------+---------+-----------+----------+--------------+ PTV      Full                                                        +---------+---------------+---------+-----------+----------+--------------+ PERO     Full                                                        +---------+---------------+---------+-----------+----------+--------------+     Summary: BILATERAL: - No obvious evidence of deep vein thrombosis seen in the lower extremities, bilaterally, however, waveforms are nearly continuous throughout the right lower extremity. The right EIV and distal CIV appear patent. -No evidence of popliteal cyst, bilaterally.   *See table(s) above for measurements and observations. Electronically signed by Carolynn Sayers on 07/11/2023 at 8:18:23 PM.    Final    CT Angio Chest Pulmonary Embolism (PE) W or WO Contrast Result Date: 07/10/2023 CLINICAL DATA:  Pulmonary embolism (PE) suspected, high prob atrial thrombus. TEE with clot-in-transit. EXAM: CT ANGIOGRAPHY CHEST WITH CONTRAST TECHNIQUE:  Multidetector CT imaging of the chest was performed using the standard protocol during bolus administration of intravenous contrast. Multiplanar CT image reconstructions and MIPs were obtained to evaluate the vascular anatomy. RADIATION DOSE REDUCTION: This exam was performed according to the departmental dose-optimization program which includes automated exposure control, adjustment of the mA and/or kV according to patient size and/or use of iterative reconstruction technique. CONTRAST:  75mL OMNIPAQUE IOHEXOL  350 MG/ML SOLN COMPARISON:  CTA PE, 07/08/2023. Chest XR, 07/09/2023. Echocardiogram, earlier same day. FINDINGS: Cardiovascular: Satisfactory opacification of the pulmonary arteries to the segmental level. Pulmonary embolus, at the LEFT pulmonary arterial bifurcation. Cardiomegaly with multi chamber cardiac enlargement, most prominently within the LEFT ventricle. No pericardial effusion. RV/LV ratio of 0.7. Mediastinum/Nodes: No enlarged mediastinal, hilar, or axillary lymph nodes. Thyroid gland, trachea, and esophagus demonstrate no significant findings. Lungs/Pleura: Pulmonary interstitial thickening, greater on RIGHT small volume RIGHT pleural effusion. Trace RIGHT pneumothorax with basilar pigtail thoracostomy tube. RIGHT dependent basilar consolidation. The LEFT lung is relatively clear. Upper Abdomen: No acute abnormality. 2.0 x 1.2 cm low density LEFT adrenal lesion, measuring -2 HU. Punctate LEFT renal calculus, incompletely imaged. Musculoskeletal: Stranding with relative obliteration of the normal fat planes along the RIGHT jugular sheath extending to the thoracic inlet. No acute osseous findings. Review of the MIP images confirms the above findings. IMPRESSION: 1. Examination is POSITIVE for LEFT segmental pulmonary emboli, at the pulmonary arterial bifurcation. RV/LV ratio of 0.7. Findings suggest interval embolization of prior clot-in-transit seen on comparison echocardiogram. 2. Trace RIGHT  pneumothorax with basilar pigtail thoracostomy tube. 3. Small volume RIGHT pleural effusion. Dependent pulmonary consolidation is likely to represent atelectasis, however superimposed pneumonia can appear similar. 4. Fat stranding along the RIGHT jugular sheath, correlate for potential recent cannulation attempt. 5. 2 cm low-density LEFT adrenal mass, consistent with lipid-rich benign adenoma. No follow-up imaging is recommended. JACR 2017 Aug; 14(8):1038-44, JCAT 2016 Mar-Apr; 40(2):194-200, Urol J 2006 Spring; 3(2):71-4. 6. Cardiomegaly with LEFT ventricular enlargement. Additional incidental, chronic and senescent findings as above. These results were called by telephone at the time of interpretation on 07/10/2023 at 2:50 p.m. to provider [facility] , who verbally acknowledged these results. Roanna Banning, MD [facility] Radiology Electronically Signed   By: Roanna Banning M.D.   On: 07/10/2023 16:10    Cardiac Studies     Patient Profile     Adante Courington is a 53 y.o. male with a hx of remote traumatic brain injury, recent pneumonia who is being seen 07/10/2023 for the evaluation of new cardiomyopathy at the request of Dr. Jerral Ralph.     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    - he is on IV lasix 40mg  bid. Negative 1.8 L yesterday and 7.2L since admissoin. Avoid over aggressive diuresis with acute PE. Mild uptrend in Cr, dose IV lasix 40mg  just once this AM and reasssess tomorrow.  - medical therapy had been limited by soft bp's which have improved. Would continue to avoid beta blocker while decompensated and also with RV dysfunction with PE. With uptrend in Cr hold on ARB and MRA today, perhaps low dose tomorrow, if tolerates progress to  entresot. Hold on SGLT2i in case procedures indicated this admission.  - consider cath when recovered from PE and pneumonia, I think likely will be outpatient. Currently extensive PE on anticoag, pneumonia with parapneumonic effusion with chest tube  2. PE - per primary team, has been evaluated by interventional radiology without plans for intervention - PE noted on CT scan, also echo with evidence of thrombus in RA in transit - LE venous US negative. UE venous US did show DVT right internal jugular, subclavian, and axillary vein.  Followed by vascular - he is on hep gtt     3.Pneumonia with parapneumonic effusion - per primary team - parapneumonic effusion, he has chest tube  For questions or updates, please contact  [facility] Please consult [email or web address] for contact info under        Signed, Dina Rich, MD  07/12/2023, 9:59 AM

## 2023-07-12 NOTE — Progress Notes (Signed)
 Pharmacy Consult for Heparin Indication: pulmonary embolus  No Known Allergies  Patient Measurements: Height: 6\' 6"  (198.1 cm) Weight: 92.5 kg (203 lb 14.8 oz) IBW/kg (Calculated) : 91.4 HEPARIN DW (KG): 106.6  Vital Signs: Temp: 98.5 F (36.9 C) (02/16 1526) Temp Source: Oral (02/16 1526) BP: 104/82 (02/16 1200) Pulse Rate: 116 (02/16 1200)  Labs: Recent Labs    07/10/23 0525 07/10/23 1718 07/10/23 2208 07/11/23 0304 07/12/23 0401 07/12/23 1525  HGB 13.2  --   --  13.6 13.4  --   HCT 39.7  --   --  41.3 39.7  --   PLT 306  --   --  301 308  --   APTT  --  91*  --   --   --   --   HEPARINUNFRC  --  0.79*   < > 0.44 0.25* 0.33  CREATININE 1.13  --   --  1.21 1.35*  --    < > = values in this interval not displayed.    Estimated Creatinine Clearance: 84.6 mL/min (A) (by C-G formula based on SCr of 1.35 mg/dL (H)).  Assessment: Anthony Singleton a 53 y.o. male on heparin for mobile R atrial thrombus, PE w/ reduced RV function (original CT negative for PE), UE DVT. No AC PTA. Received pleural fibrinolytics 2/14.   Heparin level 0.33  Goal of Therapy:  Heparin level 0.3-0.7 units/ml Monitor platelets by anticoagulation protocol: Yes   Plan:  Increase heparin infusion to 2000 units/ hour  Daily heparin level and PRN Monitor CBC, s/sx bleeding Eventual transition to oral  Thank you Okey Regal, PharmD 07/12/2023 4:26 PM
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
## 2023-07-12 NOTE — Plan of Care (Signed)

## 2023-07-12 NOTE — Progress Notes (Signed)
 Pharmacy Consult for Heparin Indication: pulmonary embolus  No Known Allergies  Patient Measurements: Height: 6\' 6"  (198.1 cm) Weight: 92.5 kg (203 lb 14.8 oz) IBW/kg (Calculated) : 91.4 HEPARIN DW (KG): 106.6  Vital Signs: Temp: 99.3 F (37.4 C) (02/16 0716) Temp Source: Oral (02/16 0716) BP: 127/82 (02/16 0807) Pulse Rate: 137 (02/16 0807)  Labs: Recent Labs    07/10/23 0525 07/10/23 1718 07/10/23 1718 07/10/23 2208 07/11/23 0304 07/12/23 0401  HGB 13.2  --   --   --  13.6 13.4  HCT 39.7  --   --   --  41.3 39.7  PLT 306  --   --   --  301 308  APTT  --  91*  --   --   --   --   HEPARINUNFRC  --  0.79*   < > 0.65 0.44 0.25*  CREATININE 1.13  --   --   --  1.21 1.35*   < > = values in this interval not displayed.    Estimated Creatinine Clearance: 84.6 mL/min (A) (by C-G formula based on SCr of 1.35 mg/dL (H)).  Assessment: Anthony Singleton a 53 y.o. male on heparin for mobile R atrial thrombus, PE w/ reduced RV function (original CT negative for PE), UE DVT. No AC PTA. Received pleural fibrinolytics 2/14.   Heparin level subtherapeutic 0.25 with stable CBC, no bleeding or issues noted.  Goal of Therapy:  Heparin level 0.3-0.7 units/ml Monitor platelets by anticoagulation protocol: Yes   Plan:  Increase heparin infusion to 1900 units/ hour  6 hour heparin level Daily heparin level and PRN Monitor CBC, s/sx bleeding Eventual transition to oral  Rutherford Nail, PharmD PGY2 Critical Care Pharmacy Resident 07/12/2023 8:22 AM

## 2023-07-13 DIAGNOSIS — I82811 Embolism and thrombosis of superficial veins of right lower extremities: Secondary | ICD-10-CM

## 2023-07-13 DIAGNOSIS — I82A11 Acute embolism and thrombosis of right axillary vein: Secondary | ICD-10-CM

## 2023-07-13 DIAGNOSIS — I2609 Other pulmonary embolism with acute cor pulmonale: Secondary | ICD-10-CM

## 2023-07-13 DIAGNOSIS — I428 Other cardiomyopathies: Secondary | ICD-10-CM

## 2023-07-13 DIAGNOSIS — I429 Cardiomyopathy, unspecified: Secondary | ICD-10-CM

## 2023-07-13 DIAGNOSIS — I5082 Biventricular heart failure: Secondary | ICD-10-CM

## 2023-07-13 DIAGNOSIS — J90 Pleural effusion, not elsewhere classified: Secondary | ICD-10-CM | POA: Diagnosis not present

## 2023-07-13 DIAGNOSIS — I2699 Other pulmonary embolism without acute cor pulmonale: Secondary | ICD-10-CM

## 2023-07-13 DIAGNOSIS — N179 Acute kidney failure, unspecified: Secondary | ICD-10-CM | POA: Insufficient documentation

## 2023-07-13 DIAGNOSIS — Z9689 Presence of other specified functional implants: Secondary | ICD-10-CM

## 2023-07-13 LAB — CBC
HCT: 39.3 % (ref 39.0–52.0)
Hemoglobin: 13.1 g/dL (ref 13.0–17.0)
MCH: 32.2 pg (ref 26.0–34.0)
MCHC: 33.3 g/dL (ref 30.0–36.0)
MCV: 96.6 fL (ref 80.0–100.0)
Platelets: 294 10*3/uL (ref 150–400)
RBC: 4.07 MIL/uL — ABNORMAL LOW (ref 4.22–5.81)
RDW: 13.3 % (ref 11.5–15.5)
WBC: 5.7 10*3/uL (ref 4.0–10.5)
nRBC: 0 % (ref 0.0–0.2)

## 2023-07-13 LAB — CULTURE, BLOOD (ROUTINE X 2)
Culture: NO GROWTH
Culture: NO GROWTH
Special Requests: ADEQUATE
Special Requests: ADEQUATE

## 2023-07-13 LAB — ANA: Anti Nuclear Antibody (ANA): NEGATIVE

## 2023-07-13 LAB — BASIC METABOLIC PANEL
Anion gap: 10 (ref 5–15)
BUN: 19 mg/dL (ref 6–20)
CO2: 28 mmol/L (ref 22–32)
Calcium: 8.3 mg/dL — ABNORMAL LOW (ref 8.9–10.3)
Chloride: 96 mmol/L — ABNORMAL LOW (ref 98–111)
Creatinine, Ser: 1.12 mg/dL (ref 0.61–1.24)
GFR, Estimated: >60 mL/min (ref >60)
Glucose, Bld: 108 mg/dL — ABNORMAL HIGH (ref 70–99)
Potassium: 3.9 mmol/L (ref 3.5–5.1)
Sodium: 134 mmol/L — ABNORMAL LOW (ref 135–145)

## 2023-07-13 LAB — CARDIOLIPIN ANTIBODIES, IGG, IGM, IGA
Anticardiolipin IgA: <9 [APL'U]/mL (ref 0–11)
Anticardiolipin IgG: <9 [GPL'U]/mL (ref 0–14)
Anticardiolipin IgM: <9 [MPL'U]/mL (ref 0–12)

## 2023-07-13 LAB — CYTOLOGY - NON PAP

## 2023-07-13 LAB — HEPARIN LEVEL (UNFRACTIONATED): Heparin Unfractionated: 0.39 [IU]/mL (ref 0.30–0.70)

## 2023-07-13 MED ORDER — DIGOXIN 0.25 MG/ML IJ SOLN
0.5000 mg | Freq: Once | INTRAMUSCULAR | Status: AC
  Administered 2023-07-13: 0.5 mg via INTRAVENOUS
  Filled 2023-07-13: qty 2

## 2023-07-13 MED ORDER — FUROSEMIDE 40 MG PO TABS
40.0000 mg | ORAL_TABLET | Freq: Every day | ORAL | Status: DC
  Administered 2023-07-13 – 2023-07-15 (×3): 40 mg via ORAL
  Filled 2023-07-13 (×3): qty 1

## 2023-07-13 MED ORDER — DIGOXIN 0.25 MG/ML IJ SOLN
0.1250 mg | Freq: Once | INTRAMUSCULAR | Status: AC
  Administered 2023-07-13: 0.125 mg via INTRAVENOUS
  Filled 2023-07-13: qty 0.5

## 2023-07-13 NOTE — Plan of Care (Signed)
  Problem: Education: Goal: Knowledge of General Education information will improve Description: Including pain rating scale, medication(s)/side effects and non-pharmacologic comfort measures Outcome: Progressing   Problem: Health Behavior/Discharge Planning: Goal: Ability to manage health-related needs will improve Outcome: Progressing   Problem: Clinical Measurements: Goal: Ability to maintain clinical measurements within normal limits will improve Outcome: Progressing Goal: Will remain free from infection Outcome: Progressing Goal: Diagnostic test results will improve Outcome: Progressing Goal: Respiratory complications will improve Outcome: Progressing Goal: Cardiovascular complication will be avoided Outcome: Progressing   Problem: Activity: Goal: Risk for activity intolerance will decrease Outcome: Progressing   Problem: Nutrition: Goal: Adequate nutrition will be maintained Outcome: Progressing   Problem: Coping: Goal: Level of anxiety will decrease Outcome: Progressing   Problem: Elimination: Goal: Will not experience complications related to bowel motility Outcome: Progressing Goal: Will not experience complications related to urinary retention Outcome: Progressing   Problem: Pain Managment: Goal: General experience of comfort will improve and/or be controlled Outcome: Progressing   Problem: Safety: Goal: Ability to remain free from injury will improve Outcome: Progressing   Problem: Skin Integrity: Goal: Risk for impaired skin integrity will decrease Outcome: Progressing   Problem: Clinical Measurements: Goal: Ability to maintain a body temperature in the normal range will improve Outcome: Progressing   Problem: Respiratory: Goal: Ability to maintain adequate ventilation will improve Outcome: Progressing Goal: Ability to maintain a clear airway will improve Outcome: Progressing
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
## 2023-07-13 NOTE — Progress Notes (Signed)
 Pharmacy Consult for Heparin Indication: pulmonary embolus  No Known Allergies  Patient Measurements: Height: 6\' 6"  (198.1 cm) Weight: 92.2 kg (203 lb 4.2 oz) IBW/kg (Calculated) : 91.4 HEPARIN DW (KG): 106.6  Vital Signs: Temp: 98.3 F (36.8 C) (02/17 0400) Temp Source: Axillary (02/17 0400) BP: 106/80 (02/17 0600) Pulse Rate: 112 (02/17 0600)  Labs: Recent Labs    07/10/23 1718 07/10/23 2208 07/11/23 0304 07/12/23 0401 07/12/23 1525 07/13/23 0228  HGB  --    < > 13.6 13.4  --  13.1  HCT  --   --  41.3 39.7  --  39.3  PLT  --   --  301 308  --  294  APTT 91*  --   --   --   --   --   HEPARINUNFRC 0.79*   < > 0.44 0.25* 0.33 0.39  CREATININE  --   --  1.21 1.35*  --  1.12   < > = values in this interval not displayed.    Estimated Creatinine Clearance: 102 mL/min (by C-G formula based on SCr of 1.12 mg/dL).  Assessment: Anthony Singleton a 53 y.o. male on heparin for mobile R atrial thrombus, PE w/ reduced RV function (original CT negative for PE), UE DVT. No AC PTA. Received pleural fibrinolytics 2/14.   Heparin level 0.39, therapeutic level x2.  Goal of Therapy:  Heparin level 0.3-0.7 units/ml Monitor platelets by anticoagulation protocol: Yes   Plan:  Continue heparin infusion to 2000 units/ hour  Daily heparin level and PRN Monitor CBC, s/sx bleeding Eventual transition to oral  Lora Paula, PharmD PGY-2 Infectious Diseases Pharmacy Resident [facility] 07/13/2023 7:06 AM
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
## 2023-07-14 ENCOUNTER — Other Ambulatory Visit (HOSPITAL_COMMUNITY): Payer: Self-pay

## 2023-07-14 ENCOUNTER — Telehealth (HOSPITAL_COMMUNITY): Payer: Self-pay | Admitting: Pharmacy Technician

## 2023-07-14 ENCOUNTER — Inpatient Hospital Stay (HOSPITAL_COMMUNITY): Payer: BC Managed Care – PPO

## 2023-07-14 DIAGNOSIS — J90 Pleural effusion, not elsewhere classified: Secondary | ICD-10-CM | POA: Diagnosis not present

## 2023-07-14 DIAGNOSIS — N179 Acute kidney failure, unspecified: Secondary | ICD-10-CM | POA: Diagnosis not present

## 2023-07-14 DIAGNOSIS — I82A11 Acute embolism and thrombosis of right axillary vein: Secondary | ICD-10-CM | POA: Diagnosis not present

## 2023-07-14 DIAGNOSIS — I429 Cardiomyopathy, unspecified: Secondary | ICD-10-CM | POA: Diagnosis not present

## 2023-07-14 DIAGNOSIS — I5082 Biventricular heart failure: Secondary | ICD-10-CM | POA: Diagnosis not present

## 2023-07-14 DIAGNOSIS — I82811 Embolism and thrombosis of superficial veins of right lower extremities: Secondary | ICD-10-CM | POA: Diagnosis not present

## 2023-07-14 DIAGNOSIS — I2609 Other pulmonary embolism with acute cor pulmonale: Secondary | ICD-10-CM | POA: Diagnosis not present

## 2023-07-14 LAB — CBC
HCT: 41.1 % (ref 39.0–52.0)
Hemoglobin: 13.7 g/dL (ref 13.0–17.0)
MCH: 32.8 pg (ref 26.0–34.0)
MCHC: 33.3 g/dL (ref 30.0–36.0)
MCV: 98.3 fL (ref 80.0–100.0)
Platelets: 268 10*3/uL (ref 150–400)
RBC: 4.18 MIL/uL — ABNORMAL LOW (ref 4.22–5.81)
RDW: 13.4 % (ref 11.5–15.5)
WBC: 4.8 10*3/uL (ref 4.0–10.5)
nRBC: 0 % (ref 0.0–0.2)

## 2023-07-14 LAB — LUPUS ANTICOAGULANT PANEL
DRVVT: 43.4 s (ref 0.0–47.0)
PTT Lupus Anticoagulant: 37.9 s (ref 0.0–43.5)

## 2023-07-14 LAB — CULTURE, RESPIRATORY W GRAM STAIN

## 2023-07-14 LAB — RAPID URINE DRUG SCREEN, HOSP PERFORMED
Amphetamines: NOT DETECTED
Barbiturates: NOT DETECTED
Benzodiazepines: NOT DETECTED
Cocaine: NOT DETECTED
Opiates: POSITIVE — AB
Tetrahydrocannabinol: POSITIVE — AB

## 2023-07-14 LAB — CULTURE, BODY FLUID W GRAM STAIN -BOTTLE: Culture: NO GROWTH

## 2023-07-14 LAB — BASIC METABOLIC PANEL
Anion gap: 9 (ref 5–15)
BUN: 16 mg/dL (ref 6–20)
CO2: 29 mmol/L (ref 22–32)
Calcium: 8.5 mg/dL — ABNORMAL LOW (ref 8.9–10.3)
Chloride: 96 mmol/L — ABNORMAL LOW (ref 98–111)
Creatinine, Ser: 1.02 mg/dL (ref 0.61–1.24)
GFR, Estimated: >60 mL/min (ref >60)
Glucose, Bld: 133 mg/dL — ABNORMAL HIGH (ref 70–99)
Potassium: 4.3 mmol/L (ref 3.5–5.1)
Sodium: 134 mmol/L — ABNORMAL LOW (ref 135–145)

## 2023-07-14 LAB — HEPARIN LEVEL (UNFRACTIONATED): Heparin Unfractionated: 0.32 [IU]/mL (ref 0.30–0.70)

## 2023-07-14 MED ORDER — ADULT MULTIVITAMIN W/MINERALS CH
1.0000 | ORAL_TABLET | Freq: Every day | ORAL | Status: DC
  Administered 2023-07-14 – 2023-07-20 (×7): 1 via ORAL
  Filled 2023-07-14 (×7): qty 1

## 2023-07-14 MED ORDER — THIAMINE MONONITRATE 100 MG PO TABS
100.0000 mg | ORAL_TABLET | Freq: Every day | ORAL | Status: DC
  Administered 2023-07-14 – 2023-07-20 (×7): 100 mg via ORAL
  Filled 2023-07-14 (×7): qty 1

## 2023-07-14 MED ORDER — DIGOXIN 125 MCG PO TABS
0.1250 mg | ORAL_TABLET | Freq: Every day | ORAL | Status: DC
  Administered 2023-07-14 – 2023-07-20 (×7): 0.125 mg via ORAL
  Filled 2023-07-14 (×8): qty 1

## 2023-07-14 MED ORDER — FOLIC ACID 1 MG PO TABS
1.0000 mg | ORAL_TABLET | Freq: Every day | ORAL | Status: DC
  Administered 2023-07-14 – 2023-07-20 (×7): 1 mg via ORAL
  Filled 2023-07-14 (×7): qty 1

## 2023-07-14 MED ORDER — APIXABAN 5 MG PO TABS
5.0000 mg | ORAL_TABLET | Freq: Two times a day (BID) | ORAL | Status: DC
  Administered 2023-07-14 – 2023-07-15 (×3): 5 mg via ORAL
  Filled 2023-07-14 (×3): qty 1

## 2023-07-14 MED ORDER — LOSARTAN POTASSIUM 25 MG PO TABS
12.5000 mg | ORAL_TABLET | Freq: Every day | ORAL | Status: DC
  Administered 2023-07-14: 12.5 mg via ORAL
  Filled 2023-07-14 (×3): qty 0.5

## 2023-07-14 NOTE — Progress Notes (Signed)
 Pharmacy Consult for Heparin Indication: pulmonary embolus  No Known Allergies  Patient Measurements: Height: 6\' 6"  (198.1 cm) Weight: 92 kg (202 lb 13.2 oz) IBW/kg (Calculated) : 91.4 HEPARIN DW (KG): 106.6  Vital Signs: Temp: 97.4 F (36.3 C) (02/18 0741) Temp Source: Axillary (02/18 0741) BP: 111/84 (02/18 0600) Pulse Rate: 101 (02/18 0600)  Labs: Recent Labs    07/12/23 0401 07/12/23 1525 07/13/23 0228 07/14/23 0256  HGB 13.4  --  13.1 13.7  HCT 39.7  --  39.3 41.1  PLT 308  --  294 268  HEPARINUNFRC 0.25* 0.33 0.39 0.32  CREATININE 1.35*  --  1.12 1.02    Estimated Creatinine Clearance: 112 mL/min (by C-G formula based on SCr of 1.02 mg/dL).  Assessment: Anthony Singleton a 53 y.o. male on heparin for mobile R atrial thrombus, PE w/ reduced RV function (original CT negative for PE), UE DVT. No AC PTA. Received pleural fibrinolytics 2/14.   Heparin level 0.32, HgB and PLTs stable. Swap to DOAC pending potential LHC during this admission.   Goal of Therapy:  Heparin level 0.3-0.7 units/ml Monitor platelets by anticoagulation protocol: Yes   Plan:  Small increase heparin infusion to 2050 units/ hour to remain therapeutic.  Daily heparin level and PRN Monitor CBC, s/sx bleeding Eventual transition to oral pending LHC this admission.   [name], PharmD, BCCCP  07/14/2023 8:08 AM
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
## 2023-07-14 NOTE — Telephone Encounter (Signed)
 Patient Product/process development scientist completed.    The patient is insured through [facility]. Patient has [employer], may use a copay card, and/or apply for patient assistance if available.    Ran test claim for Eliquis 5 mg and the current 30 day co-pay is $176.89 due to a $100.00 deductible.  Will be $80.00 once deductible is met.  Ran test claim for Xarelto 20 mg and the current 30 day co-pay is $176.89 due to a $100.00 deductible.  Will be $80.00 once deductible is met.   This test claim was processed through [facility]- copay amounts may vary at other pharmacies due to pharmacy/plan contracts, or as the patient moves through the different stages of their insurance plan.     Roland Earl, CPHT Pharmacy Technician III Certified Patient Advocate [facility] Patient Advocate Team Direct Number: [phone]  Fax: [phone]

## 2023-07-14 NOTE — Progress Notes (Signed)
 Attempted to give report to [facility] RN at [employer] but nurse in another room w/pt.... Callback number provided.

## 2023-07-14 NOTE — Progress Notes (Signed)
 Nutrition Follow-up  DOCUMENTATION CODES:  Not applicable  INTERVENTION:  Continue current diet as ordered MVI with minerals, thiamine, and folic acid daily for drug abuse hx Ensure Enlive po TID, each supplement provides 350 kcal and 20 grams of protein.  NUTRITION DIAGNOSIS:  Inadequate oral intake related to inability to eat as evidenced by NPO status. - remains applicable  GOAL:  Patient will meet greater than or equal to 90% of their needs - progressing, needs  MONITOR:  Diet advancement, PO intake  REASON FOR ASSESSMENT:  Consult Assessment of nutrition requirement/status  ASSESSMENT:  53 yo male admitted with PNA, large R pleural effusion. PMH includes remote TBI, marijuana use, methamphetamine use, anxiety, Gilbert's syndrome.  2/11 - presented to ED 2/13 - Right chest tube placed 2/14 - transferred to ICU  Pt resting in bed at the time of assessment. Awake and alert. States that appetite is great since now that he is feeling better. Does endorse that for several weeks prior to admission he was not feeling well and appetite was poor.   Pt reports that his weight has been stable around 230 lbs for several years since he was hospitalized for his TBI. Current weight is less than usual but overall appears well nourished. Pt receiving ensure and states that he likes them. Toni Arthurs is his least favorite flavor.   Admit weight: 106.6 kg ?accuracy  Current weight: 92 kg    Intake/Output Summary (Last 24 hours) at 07/14/2023 1102 Last data filed at 07/14/2023 0800 Gross per 24 hour  Intake 705.93 ml  Output 2100 ml  Net -1394.07 ml  Net IO Since Admission: -10,830.07 mL [07/14/23 1102]  Drains/Lines: UOP x 24 hours Chest Tube, right 14 Fr. 50mL x 24 hours  Average Meal Intake: 2/13: 75% intake x 1 recorded meals  Nutritionally Relevant Medications: Scheduled Meds:  digoxin  0.125 mg Oral Daily   Ensure Enlive   237 mL Oral TID BM   furosemide  40 mg Oral  Daily   polyethylene glycol  17 g Oral Daily   senna-docusate  2 tablet Oral QHS   Continuous Infusions:  piperacillin-tazobactam (ZOSYN)  IV 12.5 mL/hr at 07/14/23 0800   PRN Meds: ondansetron   Labs Reviewed: Na 134, chloride 96  NUTRITION - FOCUSED PHYSICAL EXAM: Flowsheet Row Most Recent Value  Orbital Region No depletion  Upper Arm Region No depletion  Thoracic and Lumbar Region No depletion  Buccal Region No depletion  Temple Region No depletion  Clavicle Bone Region No depletion  Clavicle and Acromion Bone Region No depletion  Scapular Bone Region No depletion  Dorsal Hand No depletion  Patellar Region No depletion  Anterior Thigh Region Mild depletion  Posterior Calf Region Mild depletion  Edema (RD Assessment) None  Hair Reviewed  Eyes Reviewed  Mouth Reviewed  Skin Reviewed  Nails Reviewed   Diet Order:   Diet Order             Diet regular Room service appropriate? Yes; Fluid consistency: Thin  Diet effective now                   EDUCATION NEEDS:  No education needs have been identified at this time  Skin:  Skin Assessment: Reviewed RN Assessment  Last BM:  2/14  Height:  Ht Readings from Last 1 Encounters:  07/07/23 6\' 6"  (1.981 m)    Weight:  Wt Readings from Last 1 Encounters:  07/14/23 92 kg    Ideal Body  Weight:  97.3 kg  BMI:  Body mass index is 23.44 kg/m.  Estimated Nutritional Needs:  Kcal:  2300-2500 Protein:  115-130 gm Fluid:  2.3-2.5 L   Greig Castilla, RD, LDN Registered Dietitian II Please reach out via secure chat Weekend on-call pager # available in [facility]
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
## 2023-07-15 ENCOUNTER — Inpatient Hospital Stay (HOSPITAL_COMMUNITY): Payer: BC Managed Care – PPO

## 2023-07-15 DIAGNOSIS — I5082 Biventricular heart failure: Secondary | ICD-10-CM | POA: Diagnosis not present

## 2023-07-15 DIAGNOSIS — I5021 Acute systolic (congestive) heart failure: Secondary | ICD-10-CM

## 2023-07-15 DIAGNOSIS — I82811 Embolism and thrombosis of superficial veins of right lower extremities: Secondary | ICD-10-CM | POA: Diagnosis not present

## 2023-07-15 DIAGNOSIS — I82A11 Acute embolism and thrombosis of right axillary vein: Secondary | ICD-10-CM | POA: Diagnosis not present

## 2023-07-15 DIAGNOSIS — J90 Pleural effusion, not elsewhere classified: Secondary | ICD-10-CM | POA: Diagnosis not present

## 2023-07-15 DIAGNOSIS — I2609 Other pulmonary embolism with acute cor pulmonale: Secondary | ICD-10-CM | POA: Diagnosis not present

## 2023-07-15 DIAGNOSIS — J189 Pneumonia, unspecified organism: Secondary | ICD-10-CM | POA: Diagnosis not present

## 2023-07-15 DIAGNOSIS — I429 Cardiomyopathy, unspecified: Secondary | ICD-10-CM | POA: Diagnosis not present

## 2023-07-15 LAB — CBC
HCT: 42.5 % (ref 39.0–52.0)
Hemoglobin: 14.2 g/dL (ref 13.0–17.0)
MCH: 32.3 pg (ref 26.0–34.0)
MCHC: 33.4 g/dL (ref 30.0–36.0)
MCV: 96.8 fL (ref 80.0–100.0)
Platelets: 300 10*3/uL (ref 150–400)
RBC: 4.39 MIL/uL (ref 4.22–5.81)
RDW: 13.4 % (ref 11.5–15.5)
WBC: 4.9 10*3/uL (ref 4.0–10.5)
nRBC: 0 % (ref 0.0–0.2)

## 2023-07-15 LAB — BASIC METABOLIC PANEL
Anion gap: 11 (ref 5–15)
BUN: 15 mg/dL (ref 6–20)
CO2: 25 mmol/L (ref 22–32)
Calcium: 8.9 mg/dL (ref 8.9–10.3)
Chloride: 100 mmol/L (ref 98–111)
Creatinine, Ser: 1 mg/dL (ref 0.61–1.24)
GFR, Estimated: >60 mL/min (ref >60)
Glucose, Bld: 122 mg/dL — ABNORMAL HIGH (ref 70–99)
Potassium: 4.2 mmol/L (ref 3.5–5.1)
Sodium: 136 mmol/L (ref 135–145)

## 2023-07-15 LAB — ECHOCARDIOGRAM LIMITED
Height: 78 in
S' Lateral: 5.75 cm
Weight: 3403.9 [oz_av]

## 2023-07-15 LAB — LIPID PANEL
Cholesterol: 157 mg/dL (ref 0–200)
HDL: 34 mg/dL — ABNORMAL LOW (ref >40)
LDL Cholesterol: 106 mg/dL — ABNORMAL HIGH (ref 0–99)
Total CHOL/HDL Ratio: 4.6 {ratio}
Triglycerides: 86 mg/dL (ref <150)
VLDL: 17 mg/dL (ref 0–40)

## 2023-07-15 LAB — BRAIN NATRIURETIC PEPTIDE: B Natriuretic Peptide: 495.1 pg/mL — ABNORMAL HIGH (ref 0.0–100.0)

## 2023-07-15 LAB — LACTIC ACID, PLASMA
Lactic Acid, Venous: 1 mmol/L (ref 0.5–1.9)
Lactic Acid, Venous: 1.4 mmol/L (ref 0.5–1.9)

## 2023-07-15 LAB — LDL CHOLESTEROL, DIRECT: Direct LDL: 119 mg/dL — ABNORMAL HIGH (ref 0–99)

## 2023-07-15 MED ORDER — SACUBITRIL-VALSARTAN 24-26 MG PO TABS
1.0000 | ORAL_TABLET | Freq: Two times a day (BID) | ORAL | Status: DC
  Administered 2023-07-15: 1 via ORAL
  Filled 2023-07-15 (×2): qty 1

## 2023-07-15 MED ORDER — PERFLUTREN LIPID MICROSPHERE
1.0000 mL | INTRAVENOUS | Status: AC | PRN
Start: 2023-07-15 — End: 2023-07-15
  Administered 2023-07-15: 2 mL via INTRAVENOUS

## 2023-07-15 MED ORDER — APIXABAN 5 MG PO TABS
5.0000 mg | ORAL_TABLET | Freq: Two times a day (BID) | ORAL | Status: AC
  Administered 2023-07-15: 5 mg via ORAL
  Filled 2023-07-15: qty 1

## 2023-07-15 MED ORDER — SODIUM CHLORIDE 0.9 % IV SOLN: INTRAVENOUS | Status: DC

## 2023-07-15 MED ORDER — SPIRONOLACTONE 12.5 MG HALF TABLET
12.5000 mg | ORAL_TABLET | Freq: Every day | ORAL | Status: DC
  Administered 2023-07-15 – 2023-07-20 (×6): 12.5 mg via ORAL
  Filled 2023-07-15 (×7): qty 1

## 2023-07-15 MED ORDER — ROSUVASTATIN CALCIUM 5 MG PO TABS
10.0000 mg | ORAL_TABLET | Freq: Every day | ORAL | Status: DC
  Administered 2023-07-15 – 2023-07-20 (×6): 10 mg via ORAL
  Filled 2023-07-15 (×6): qty 2

## 2023-07-15 MED ORDER — ASPIRIN 81 MG PO CHEW
81.0000 mg | CHEWABLE_TABLET | ORAL | Status: AC
  Administered 2023-07-16: 81 mg via ORAL
  Filled 2023-07-15: qty 1
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
## 2023-07-15 NOTE — Progress Notes (Signed)
 Heart Failure Navigator Progress Note  Assessed for Heart & Vascular TOC clinic readiness.  Patient does not meet criteria due to Advanced Heart Failure Team consult. .   Navigator will sign off at this time.    Rhae Hammock, BSN, Scientist, clinical (histocompatibility and immunogenetics) Only
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
## 2023-07-15 NOTE — Plan of Care (Signed)
 Pt has rested quietly throughout the night with no distress noted. Has been sleeping on and off. Alert and oriented. On room air. ST on the monitor. Voids per urinal. Up to BR this morning to have BM. No complaints voiced.     Problem: Education: Goal: Knowledge of General Education information will improve Description: Including pain rating scale, medication(s)/side effects and non-pharmacologic comfort measures Outcome: Progressing   Problem: Health Behavior/Discharge Planning: Goal: Ability to manage health-related needs will improve Outcome: Progressing   Problem: Clinical Measurements: Goal: Ability to maintain clinical measurements within normal limits will improve Outcome: Progressing Goal: Will remain free from infection Outcome: Progressing Goal: Diagnostic test results will improve Outcome: Progressing Goal: Respiratory complications will improve Outcome: Progressing Goal: Cardiovascular complication will be avoided Outcome: Progressing   Problem: Activity: Goal: Risk for activity intolerance will decrease Outcome: Progressing   Problem: Nutrition: Goal: Adequate nutrition will be maintained Outcome: Progressing   Problem: Coping: Goal: Level of anxiety will decrease Outcome: Progressing   Problem: Elimination: Goal: Will not experience complications related to bowel motility Outcome: Progressing Goal: Will not experience complications related to urinary retention Outcome: Progressing   Problem: Pain Managment: Goal: General experience of comfort will improve and/or be controlled Outcome: Progressing   Problem: Safety: Goal: Ability to remain free from injury will improve Outcome: Progressing   Problem: Skin Integrity: Goal: Risk for impaired skin integrity will decrease Outcome: Progressing   Problem: Activity: Goal: Ability to tolerate increased activity will improve Outcome: Progressing   Problem: Clinical Measurements: Goal: Ability to  maintain a body temperature in the normal range will improve Outcome: Progressing   Problem: Respiratory: Goal: Ability to maintain adequate ventilation will improve Outcome: Progressing Goal: Ability to maintain a clear airway will improve Outcome: Progressing   Problem: Activity: Goal: Ability to tolerate increased activity will improve Outcome: Progressing   Problem: Clinical Measurements: Goal: Ability to maintain a body temperature in the normal range will improve Outcome: Progressing   Problem: Respiratory: Goal: Ability to maintain adequate ventilation will improve Outcome: Progressing Goal: Ability to maintain a clear airway will improve Outcome: Progressing   Problem: Education: Goal: Ability to demonstrate management of disease process will improve Outcome: Progressing Goal: Ability to verbalize understanding of medication therapies will improve Outcome: Progressing Goal: Individualized Educational Video(s) Outcome: Progressing   Problem: Activity: Goal: Capacity to carry out activities will improve Outcome: Progressing   Problem: Cardiac: Goal: Ability to achieve and maintain adequate cardiopulmonary perfusion will improve Outcome: Progressing
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
## 2023-07-15 NOTE — Plan of Care (Signed)
  Problem: Education: Goal: Knowledge of General Education information will improve Description: Including pain rating scale, medication(s)/side effects and non-pharmacologic comfort measures Outcome: Progressing   Problem: Health Behavior/Discharge Planning: Goal: Ability to manage health-related needs will improve Outcome: Progressing   Problem: Clinical Measurements: Goal: Ability to maintain clinical measurements within normal limits will improve Outcome: Progressing Goal: Will remain free from infection Outcome: Progressing Goal: Diagnostic test results will improve Outcome: Progressing Goal: Respiratory complications will improve Outcome: Progressing Goal: Cardiovascular complication will be avoided Outcome: Progressing   Problem: Activity: Goal: Risk for activity intolerance will decrease Outcome: Progressing   Problem: Nutrition: Goal: Adequate nutrition will be maintained Outcome: Progressing   Problem: Coping: Goal: Level of anxiety will decrease Outcome: Progressing   Problem: Elimination: Goal: Will not experience complications related to bowel motility Outcome: Progressing Goal: Will not experience complications related to urinary retention Outcome: Progressing   Problem: Pain Managment: Goal: General experience of comfort will improve and/or be controlled Outcome: Progressing   Problem: Safety: Goal: Ability to remain free from injury will improve Outcome: Progressing   Problem: Skin Integrity: Goal: Risk for impaired skin integrity will decrease Outcome: Progressing   Problem: Activity: Goal: Ability to tolerate increased activity will improve Outcome: Progressing   Problem: Clinical Measurements: Goal: Ability to maintain a body temperature in the normal range will improve Outcome: Progressing   Problem: Respiratory: Goal: Ability to maintain adequate ventilation will improve Outcome: Progressing Goal: Ability to maintain a clear airway  will improve Outcome: Progressing   Problem: Activity: Goal: Ability to tolerate increased activity will improve Outcome: Progressing   Problem: Clinical Measurements: Goal: Ability to maintain a body temperature in the normal range will improve Outcome: Progressing   Problem: Respiratory: Goal: Ability to maintain adequate ventilation will improve Outcome: Progressing Goal: Ability to maintain a clear airway will improve Outcome: Progressing
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
## 2023-07-15 NOTE — Progress Notes (Signed)
 OT Cancellation Note  Patient Details Name: Que Meneely MRN: [id number] DOB: [birth year]   Cancelled Treatment:    Reason Eval/Treat Not Completed: (P) OT screened, no needs identified, will sign off, per PT Pt is independent, all needs met, signing off.  Alexis Goodell 07/15/2023, 10:47 AM
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
## 2023-07-16 ENCOUNTER — Encounter (HOSPITAL_COMMUNITY): Payer: Self-pay | Admitting: Cardiology

## 2023-07-16 ENCOUNTER — Encounter (HOSPITAL_COMMUNITY)
Admission: EM | Disposition: A | Discharge status: Home / Self Care | Payer: Self-pay | Source: Home / Self Care | Attending: Internal Medicine

## 2023-07-16 DIAGNOSIS — J90 Pleural effusion, not elsewhere classified: Secondary | ICD-10-CM | POA: Diagnosis not present

## 2023-07-16 DIAGNOSIS — I82A11 Acute embolism and thrombosis of right axillary vein: Secondary | ICD-10-CM | POA: Diagnosis not present

## 2023-07-16 DIAGNOSIS — I2609 Other pulmonary embolism with acute cor pulmonale: Secondary | ICD-10-CM | POA: Diagnosis not present

## 2023-07-16 DIAGNOSIS — I429 Cardiomyopathy, unspecified: Secondary | ICD-10-CM | POA: Diagnosis not present

## 2023-07-16 HISTORY — PX: LEFT HEART CATH AND CORONARY ANGIOGRAPHY: CATH118249

## 2023-07-16 LAB — GLUCOSE, CAPILLARY: Glucose-Capillary: 101 mg/dL — ABNORMAL HIGH (ref 70–99)

## 2023-07-16 LAB — BASIC METABOLIC PANEL
Anion gap: 8 (ref 5–15)
BUN: 15 mg/dL (ref 6–20)
CO2: 24 mmol/L (ref 22–32)
Calcium: 8.9 mg/dL (ref 8.9–10.3)
Chloride: 104 mmol/L (ref 98–111)
Creatinine, Ser: 0.93 mg/dL (ref 0.61–1.24)
GFR, Estimated: >60 mL/min (ref >60)
Glucose, Bld: 97 mg/dL (ref 70–99)
Potassium: 4.2 mmol/L (ref 3.5–5.1)
Sodium: 136 mmol/L (ref 135–145)

## 2023-07-16 LAB — CBC
HCT: 45 % (ref 39.0–52.0)
Hemoglobin: 14.9 g/dL (ref 13.0–17.0)
MCH: 32.1 pg (ref 26.0–34.0)
MCHC: 33.1 g/dL (ref 30.0–36.0)
MCV: 97 fL (ref 80.0–100.0)
Platelets: 335 10*3/uL (ref 150–400)
RBC: 4.64 MIL/uL (ref 4.22–5.81)
RDW: 13.6 % (ref 11.5–15.5)
WBC: 6.5 10*3/uL (ref 4.0–10.5)
nRBC: 0 % (ref 0.0–0.2)

## 2023-07-16 LAB — HEMOGLOBIN A1C
Hgb A1c MFr Bld: 5.7 % — ABNORMAL HIGH (ref 4.8–5.6)
Mean Plasma Glucose: 116.89 mg/dL

## 2023-07-16 SURGERY — RIGHT/LEFT HEART CATH AND CORONARY ANGIOGRAPHY
Anesthesia: LOCAL

## 2023-07-16 MED ORDER — HEPARIN SODIUM (PORCINE) 1000 UNIT/ML IJ SOLN
INTRAMUSCULAR | Status: DC | PRN
  Administered 2023-07-16: 5000 [IU] via INTRAVENOUS

## 2023-07-16 MED ORDER — HEPARIN (PORCINE) IN NACL 1000-0.9 UT/500ML-% IV SOLN
INTRAVENOUS | Status: DC | PRN
  Administered 2023-07-16 (×2): 500 mL

## 2023-07-16 MED ORDER — ONDANSETRON HCL 4 MG/2ML IJ SOLN: 4.0000 mg | Freq: Four times a day (QID) | INTRAMUSCULAR | Status: DC | PRN

## 2023-07-16 MED ORDER — VERAPAMIL HCL 2.5 MG/ML IV SOLN
INTRAVENOUS | Status: AC
  Filled 2023-07-16: qty 2

## 2023-07-16 MED ORDER — APIXABAN 5 MG PO TABS
5.0000 mg | ORAL_TABLET | Freq: Two times a day (BID) | ORAL | Status: DC
  Administered 2023-07-16 – 2023-07-20 (×8): 5 mg via ORAL
  Filled 2023-07-16 (×8): qty 1

## 2023-07-16 MED ORDER — HEPARIN SODIUM (PORCINE) 1000 UNIT/ML IJ SOLN
INTRAMUSCULAR | Status: AC
  Filled 2023-07-16: qty 10

## 2023-07-16 MED ORDER — MIDAZOLAM HCL 2 MG/2ML IJ SOLN
INTRAMUSCULAR | Status: DC | PRN
  Administered 2023-07-16: 1 mg via INTRAVENOUS

## 2023-07-16 MED ORDER — ACETAMINOPHEN 325 MG PO TABS: 650.0000 mg | ORAL_TABLET | ORAL | Status: DC | PRN

## 2023-07-16 MED ORDER — ONDANSETRON HCL 4 MG/2ML IJ SOLN
INTRAMUSCULAR | Status: AC
Start: 2023-07-16 — End: 2023-07-16
  Administered 2023-07-16: 4 mg via INTRAVENOUS
  Filled 2023-07-16: qty 2

## 2023-07-16 MED ORDER — FENTANYL CITRATE (PF) 100 MCG/2ML IJ SOLN
INTRAMUSCULAR | Status: DC | PRN
  Administered 2023-07-16: 25 ug via INTRAVENOUS

## 2023-07-16 MED ORDER — LABETALOL HCL 5 MG/ML IV SOLN: 10.0000 mg | INTRAVENOUS | Status: AC | PRN

## 2023-07-16 MED ORDER — SODIUM CHLORIDE 0.9 % IV SOLN: 250.0000 mL | INTRAVENOUS | Status: AC | PRN

## 2023-07-16 MED ORDER — HYDRALAZINE HCL 20 MG/ML IJ SOLN: 10.0000 mg | INTRAMUSCULAR | Status: AC | PRN

## 2023-07-16 MED ORDER — SACUBITRIL-VALSARTAN 24-26 MG PO TABS
1.0000 | ORAL_TABLET | Freq: Two times a day (BID) | ORAL | Status: DC
  Administered 2023-07-16 – 2023-07-20 (×8): 1 via ORAL
  Filled 2023-07-16 (×8): qty 1

## 2023-07-16 MED ORDER — VERAPAMIL HCL 2.5 MG/ML IV SOLN
INTRAVENOUS | Status: DC | PRN
  Administered 2023-07-16: 10 mL via INTRA_ARTERIAL

## 2023-07-16 MED ORDER — MIDAZOLAM HCL 2 MG/2ML IJ SOLN
INTRAMUSCULAR | Status: AC
  Filled 2023-07-16: qty 2

## 2023-07-16 MED ORDER — FENTANYL CITRATE (PF) 100 MCG/2ML IJ SOLN
INTRAMUSCULAR | Status: AC
  Filled 2023-07-16: qty 2

## 2023-07-16 MED ORDER — LOSARTAN POTASSIUM 25 MG PO TABS
12.5000 mg | ORAL_TABLET | Freq: Every day | ORAL | Status: DC
Start: 2023-07-16 — End: 2023-07-16

## 2023-07-16 MED ORDER — LIDOCAINE HCL (PF) 1 % IJ SOLN
INTRAMUSCULAR | Status: DC | PRN
  Administered 2023-07-16: 2 mL via INTRADERMAL

## 2023-07-16 MED ORDER — IOHEXOL 350 MG/ML SOLN
INTRAVENOUS | Status: DC | PRN
Start: 2023-07-16 — End: 2023-07-16
  Administered 2023-07-16: 50 mL

## 2023-07-16 MED ORDER — SODIUM CHLORIDE 0.9% FLUSH: 3.0000 mL | INTRAVENOUS | Status: DC | PRN

## 2023-07-16 MED ORDER — SODIUM CHLORIDE 0.9% FLUSH
3.0000 mL | Freq: Two times a day (BID) | INTRAVENOUS | Status: DC
  Administered 2023-07-17 – 2023-07-20 (×6): 3 mL via INTRAVENOUS

## 2023-07-16 MED ORDER — LIDOCAINE HCL (PF) 1 % IJ SOLN
INTRAMUSCULAR | Status: AC
  Filled 2023-07-16: qty 30

## 2023-07-16 SURGICAL SUPPLY — 9 items
CATH 5FR JL3.5 JR4 ANG PIG MP (CATHETERS) ×1
CATH LAUNCHER 6FR JL3 (CATHETERS) ×1
DEVICE RAD COMP TR BAND LRG (VASCULAR PRODUCTS) ×1
GLIDESHEATH SLEND SS 6F .021 (SHEATH) ×1
INQWIRE 1.5J .035X260CM (WIRE) ×1 IMPLANT
KIT HEART LEFT (KITS) ×1
PACK CARDIAC CATHETERIZATION (CUSTOM PROCEDURE TRAY) ×1
TRANSDUCER W/STOPCOCK (MISCELLANEOUS) ×1
TUBING CIL FLEX 10 FLL-RA (TUBING) ×1

## 2023-07-16 NOTE — Interval H&P Note (Signed)
 History and Physical Interval Note:  07/16/2023 11:04 AM  Anthony Singleton  has presented today for surgery, with the diagnosis of heart failure.  The various methods of treatment have been discussed with the patient and family. After consideration of risks, benefits and other options for treatment, the patient has consented to  Procedure(s): LEFT HEART CATH AND CORONARY ANGIOGRAPHY (N/A) as a surgical intervention.  The patient's history has been reviewed, patient examined, no change in status, stable for surgery.  I have reviewed the patient's chart and labs.  Questions were answered to the patient's satisfaction.     Aiyah Scarpelli [employer]

## 2023-07-16 NOTE — Progress Notes (Signed)
 Patient ID: Annette Liotta, male   DOB: July 25, 1970, 53 y.o.   MRN: [id number]     Advanced Heart Failure Rounding Note  Cardiologist: None  Chief Complaint: CHF Subjective:    Cath done today, no significant coronary disease.    Patient denies dyspnea.  SBP 100s.  Creatinine stable.    Objective:   Weight Range: 96.2 kg Body mass index is 24.51 kg/m.   Vital Signs:   Temp:  [97.6 F (36.4 C)-98.1 F (36.7 C)] 98.1 F (36.7 C) (02/20 0800) Pulse Rate:  [0-114] 0 (02/20 1137) Resp:  [12-27] 18 (02/20 1127) BP: (87-121)/(46-89) 105/81 (02/20 1132) SpO2:  [94 %-98 %] 95 % (02/20 1132) Weight:  [96.2 kg-96.6 kg] 96.2 kg (02/20 0635) Last BM Date : 07/15/23  Weight change: Filed Weights   07/15/23 0428 07/16/23 0500 07/16/23 0635  Weight: 96.5 kg 96.6 kg 96.2 kg    Intake/Output:   Intake/Output Summary (Last 24 hours) at 07/16/2023 1142 Last data filed at 07/16/2023 0844 Gross per 24 hour  Intake 1360 ml  Output 1502 ml  Net -142 ml      Physical Exam    General:  Well appearing. No resp difficulty HEENT: Normal Neck: Supple. JVP not elevated. Carotids 2+ bilat; no bruits. No lymphadenopathy or thyromegaly appreciated. Cor: PMI nondisplaced. Regular rate & rhythm. No rubs, gallops or murmurs. Lungs: Clear Abdomen: Soft, nontender, nondistended. No hepatosplenomegaly. No bruits or masses. Good bowel sounds. Extremities: No cyanosis, clubbing, rash, edema Neuro: Alert & orientedx3, cranial nerves grossly intact. moves all 4 extremities w/o difficulty. Affect pleasant   Telemetry   NSR 100s (normal)   Labs    CBC Recent Labs    07/15/23 0449 07/16/23 0507  WBC 4.9 6.5  HGB 14.2 14.9  HCT 42.5 45.0  MCV 96.8 97.0  PLT 300 335   Basic Metabolic Panel Recent Labs    40/34/74 0449 07/16/23 0507  NA 136 136  K 4.2 4.2  CL 100 104  CO2 25 24  GLUCOSE 122* 97  BUN 15 15  CREATININE 1.00 0.93  CALCIUM 8.9 8.9   Liver Function Tests No  results for input(s): "AST", "ALT", "ALKPHOS", "BILITOT", "PROT", "ALBUMIN" in the last 72 hours. No results for input(s): "LIPASE", "AMYLASE" in the last 72 hours. Cardiac Enzymes No results for input(s): "CKTOTAL", "CKMB", "CKMBINDEX", "TROPONINI" in the last 72 hours.  BNP: BNP (last 3 results) Recent Labs    07/07/23 2144 07/09/23 0841 07/15/23 0449  BNP 1,504.8* 1,055.2* 495.1*    ProBNP (last 3 results) No results for input(s): "PROBNP" in the last 8760 hours.   D-Dimer No results for input(s): "DDIMER" in the last 72 hours. Hemoglobin A1C Recent Labs    07/16/23 0507  HGBA1C 5.7*   Fasting Lipid Panel Recent Labs    07/15/23 0449  CHOL 157  HDL 34*  LDLCALC 106*  TRIG 86  CHOLHDL 4.6  LDLDIRECT 119*   Thyroid Function Tests No results for input(s): "TSH", "T4TOTAL", "T3FREE", "THYROIDAB" in the last 72 hours.  Invalid input(s): "FREET3"  Other results:   Imaging    CARDIAC CATHETERIZATION Result Date: 07/16/2023 No significant CAD noted. Nonischemic cardiomyopathy.   ECHOCARDIOGRAM LIMITED Result Date: 07/15/2023    ECHOCARDIOGRAM LIMITED REPORT   Patient Name:   AZARIA BARTELL Date of Exam: 07/15/2023 Medical Rec #:  [id number]             Height:       78.0 in Accession #:  [id number]            Weight:       212.7 lb Date of Birth:  [birth year]             BSA:          2.318 m Patient Age:    52 years              BP:           121/86 mmHg Patient Gender: M                     HR:           91 bpm. Exam Location:  Inpatient Procedure: Limited Echo, Cardiac Doppler, Color Doppler and Intracardiac            Opacification Agent (Both Spectral and Color Flow Doppler were            utilized during procedure). Indications:    I50.40* Unspecified combined systolic (congestive) and diastolic                 (congestive) heart failure  History:        Patient has prior history of Echocardiogram examinations, most                 recent 07/10/2023.  Cardiomyopathy, Abnormal ECG,                 Arrythmias:Tachycardia; Risk Factors:Hypertension. Polysubstance                 abuse. SDH. Right atrial thrombus. Pulmonary embolus.  Sonographer:    Sheralyn Boatman RDCS Referring Phys: [phone] [facility]  Sonographer Comments: Suboptimal subcostal window and suboptimal apical window. Patient supine and stated he is not feeling well, and that it "Could not be a worse time" for echo. IMPRESSIONS  1. Left ventricular ejection fraction, by estimation, is 20 to 25%. The left ventricle has severely decreased function. The left ventricle demonstrates global hypokinesis. The left ventricular internal cavity size was mildly dilated.  2. The aortic valve is tricuspid. Comparison(s): No further evidence of thrombus in transit across tricuspid valve. FINDINGS  Left Ventricle: Left ventricular ejection fraction, by estimation, is 20 to 25%. The left ventricle has severely decreased function. The left ventricle demonstrates global hypokinesis. Definity contrast agent was given IV to delineate the left ventricular endocardial borders. Strain imaging was not performed. The left ventricular internal cavity size was mildly dilated. Abnormal (paradoxical) septal motion, consistent with left bundle branch block. Aortic Valve: The aortic valve is tricuspid. Additional Comments: 3D imaging was not performed. Spectral Doppler performed. Color Doppler performed.  LEFT VENTRICLE PLAX 2D LVIDd:         6.50 cm LVIDs:         5.75 cm LV PW:         1.00 cm LV IVS:        0.80 cm  IVC IVC diam: 1.30 cm Donato Schultz MD Electronically signed by Donato Schultz MD Signature Date/Time: 07/15/2023/4:03:46 PM    Final      Medications:     Scheduled Medications:  [MAR Hold] Chlorhexidine Gluconate Cloth  6 each Topical Daily   [MAR Hold] digoxin  0.125 mg Oral Daily   [MAR Hold] feeding supplement  237 mL Oral TID BM   [MAR Hold] folic acid  1 mg Oral Daily   [MAR Hold] lidocaine  1 patch  Transdermal Q24H   [  MAR Hold] multivitamin with minerals  1 tablet Oral Daily   [MAR Hold] polyethylene glycol  17 g Oral Daily   [MAR Hold] rosuvastatin  10 mg Oral Daily   sacubitril-valsartan  1 tablet Oral BID   [MAR Hold] senna-docusate  2 tablet Oral QHS   [MAR Hold] sodium chloride flush  10 mL Intrapleural Q8H   [MAR Hold] spironolactone  12.5 mg Oral Daily   [MAR Hold] thiamine  100 mg Oral Daily    Infusions:  sodium chloride 10 mL/hr at 07/16/23 0524   [MAR Hold] piperacillin-tazobactam (ZOSYN)  IV 3.375 g (07/16/23 0532)    PRN Medications: [MAR Hold] acetaminophen **OR** [MAR Hold] acetaminophen, [MAR Hold] cyclobenzaprine, fentaNYL, [MAR Hold] haloperidol lactate, Heparin (Porcine) in NaCl, heparin sodium (porcine), [MAR Hold] HYDROcodone bit-homatropine, [MAR Hold]  HYDROmorphone (DILAUDID) injection, [MAR Hold] hydrOXYzine, iohexol, lidocaine (PF), [MAR Hold] melatonin, midazolam, [MAR Hold] ondansetron (ZOFRAN) IV, [MAR Hold] mouth rinse, [MAR Hold] oxyCODONE, Radial Cocktail/Verapamil only   Assessment/Plan   1. Acute systolic CHF: Nonischemic cardiomyopathy.  Echo this admission with  EF 15% with global hypokinesis and moderate LV dilation; RV function moderately reduced; mild MR.  Patient has no family history of cardiomyopathy, he does report family members with CAD though not premature.  Patient has a history of methamphetamine abuse which can cause a dilated cardiomyopathy though he says he has quit x 5-6 months. Coronary angiography today showed no significant CAD.  RHC was not done due to recent venous thromboembolism.  LVEDP not elevated, he is not volume overloaded on exam.  - Continue digoxin 0.125 daily - Hold Lasix.  - Continue spironolactone 12.5 daily . - Continue Entresto 24/26 bid.  - Eventual SGLT2 inhibitor.  - I will arrange for cardiac MRI.  2. Venous thromboembolism: Patient has right internal jugular and right subclavian thromboses, prior echo  showed RA clot in transit.  CTA chest showed PE on left. Patient does have a history of RIJ CVL at prior hospitalization at [facility] it appears.  Clotting could be due to slow flow with CHF, but I do worry about a hypercoagulable disorder.  - Continue apixaban.  - I have sent Factor V Leiden, prothrombin gene mutation, and antiphospholipid workup.  3. R-sided PNA: He remains on Zosyn per primary team.  4. Polysubstance abuse: Marijuana and methamphetamine.  He says he has been off meth for about 5 months.   Length of Stay: 8  Marca Ancona, MD  07/16/2023, 11:42 AM  Advanced Heart Failure Team Pager [phone] (M-F; 7a - 5p)  Please contact [facility] for night-coverage after hours (5p -7a ) and weekends on [email or web address]

## 2023-07-16 NOTE — Plan of Care (Signed)
  Problem: Education: Goal: Knowledge of General Education information will improve Description: Including pain rating scale, medication(s)/side effects and non-pharmacologic comfort measures Outcome: Progressing   Problem: Health Behavior/Discharge Planning: Goal: Ability to manage health-related needs will improve Outcome: Progressing   Problem: Clinical Measurements: Goal: Ability to maintain clinical measurements within normal limits will improve Outcome: Progressing Goal: Will remain free from infection Outcome: Progressing Goal: Diagnostic test results will improve Outcome: Progressing Goal: Respiratory complications will improve Outcome: Progressing Goal: Cardiovascular complication will be avoided Outcome: Progressing   Problem: Activity: Goal: Risk for activity intolerance will decrease Outcome: Progressing   Problem: Nutrition: Goal: Adequate nutrition will be maintained Outcome: Progressing   Problem: Coping: Goal: Level of anxiety will decrease Outcome: Progressing   Problem: Elimination: Goal: Will not experience complications related to bowel motility Outcome: Progressing Goal: Will not experience complications related to urinary retention Outcome: Progressing   Problem: Pain Managment: Goal: General experience of comfort will improve and/or be controlled Outcome: Progressing   Problem: Safety: Goal: Ability to remain free from injury will improve Outcome: Progressing   Problem: Skin Integrity: Goal: Risk for impaired skin integrity will decrease Outcome: Progressing   Problem: Activity: Goal: Ability to tolerate increased activity will improve Outcome: Progressing   Problem: Clinical Measurements: Goal: Ability to maintain a body temperature in the normal range will improve Outcome: Progressing   Problem: Respiratory: Goal: Ability to maintain adequate ventilation will improve Outcome: Progressing Goal: Ability to maintain a clear airway  will improve Outcome: Progressing   Problem: Activity: Goal: Ability to tolerate increased activity will improve Outcome: Progressing   Problem: Clinical Measurements: Goal: Ability to maintain a body temperature in the normal range will improve Outcome: Progressing   Problem: Respiratory: Goal: Ability to maintain adequate ventilation will improve Outcome: Progressing Goal: Ability to maintain a clear airway will improve Outcome: Progressing   Problem: Education: Goal: Ability to demonstrate management of disease process will improve Outcome: Progressing Goal: Ability to verbalize understanding of medication therapies will improve Outcome: Progressing Goal: Individualized Educational Video(s) Outcome: Progressing   Problem: Activity: Goal: Capacity to carry out activities will improve Outcome: Progressing   Problem: Cardiac: Goal: Ability to achieve and maintain adequate cardiopulmonary perfusion will improve Outcome: Progressing   Problem: Education: Goal: Understanding of CV disease, CV risk reduction, and recovery process will improve Outcome: Progressing Goal: Individualized Educational Video(s) Outcome: Progressing   Problem: Activity: Goal: Ability to return to baseline activity level will improve Outcome: Progressing   Problem: Cardiovascular: Goal: Ability to achieve and maintain adequate cardiovascular perfusion will improve Outcome: Progressing Goal: Vascular access site(s) Level 0-1 will be maintained Outcome: Progressing   Problem: Health Behavior/Discharge Planning: Goal: Ability to safely manage health-related needs after discharge will improve Outcome: Progressing
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
## 2023-07-16 NOTE — Plan of Care (Signed)
 Pt has rested quietly throughout the night with no distress noted. Alert and oriented. On room air. ST on the monitor. Voids per urinal. Medicated for chest tube site pain once with relief noted. Medicated for HA this am with relief noted. RAC IV inserted per IV team. Right wrist shaved. No other complaints voiced.     Problem: Education: Goal: Knowledge of General Education information will improve Description: Including pain rating scale, medication(s)/side effects and non-pharmacologic comfort measures Outcome: Progressing   Problem: Health Behavior/Discharge Planning: Goal: Ability to manage health-related needs will improve Outcome: Progressing   Problem: Clinical Measurements: Goal: Ability to maintain clinical measurements within normal limits will improve Outcome: Progressing Goal: Will remain free from infection Outcome: Progressing Goal: Diagnostic test results will improve Outcome: Progressing Goal: Respiratory complications will improve Outcome: Progressing Goal: Cardiovascular complication will be avoided Outcome: Progressing   Problem: Activity: Goal: Risk for activity intolerance will decrease Outcome: Progressing   Problem: Nutrition: Goal: Adequate nutrition will be maintained Outcome: Progressing   Problem: Coping: Goal: Level of anxiety will decrease Outcome: Progressing   Problem: Elimination: Goal: Will not experience complications related to bowel motility Outcome: Progressing Goal: Will not experience complications related to urinary retention Outcome: Progressing   Problem: Pain Managment: Goal: General experience of comfort will improve and/or be controlled Outcome: Progressing   Problem: Safety: Goal: Ability to remain free from injury will improve Outcome: Progressing   Problem: Skin Integrity: Goal: Risk for impaired skin integrity will decrease Outcome: Progressing   Problem: Activity: Goal: Ability to tolerate increased activity  will improve Outcome: Progressing   Problem: Clinical Measurements: Goal: Ability to maintain a body temperature in the normal range will improve Outcome: Progressing   Problem: Respiratory: Goal: Ability to maintain adequate ventilation will improve Outcome: Progressing Goal: Ability to maintain a clear airway will improve Outcome: Progressing   Problem: Activity: Goal: Ability to tolerate increased activity will improve Outcome: Progressing   Problem: Clinical Measurements: Goal: Ability to maintain a body temperature in the normal range will improve Outcome: Progressing   Problem: Respiratory: Goal: Ability to maintain adequate ventilation will improve Outcome: Progressing Goal: Ability to maintain a clear airway will improve Outcome: Progressing   Problem: Education: Goal: Ability to demonstrate management of disease process will improve Outcome: Progressing Goal: Ability to verbalize understanding of medication therapies will improve Outcome: Progressing Goal: Individualized Educational Video(s) Outcome: Progressing   Problem: Activity: Goal: Capacity to carry out activities will improve Outcome: Progressing   Problem: Cardiac: Goal: Ability to achieve and maintain adequate cardiopulmonary perfusion will improve Outcome: Progressing   Problem: Education: Goal: Understanding of CV disease, CV risk reduction, and recovery process will improve Outcome: Progressing Goal: Individualized Educational Video(s) Outcome: Progressing   Problem: Activity: Goal: Ability to return to baseline activity level will improve Outcome: Progressing   Problem: Cardiovascular: Goal: Ability to achieve and maintain adequate cardiovascular perfusion will improve Outcome: Progressing Goal: Vascular access site(s) Level 0-1 will be maintained Outcome: Progressing   Problem: Health Behavior/Discharge Planning: Goal: Ability to safely manage health-related needs after discharge  will improve Outcome: Progressing

## 2023-07-17 ENCOUNTER — Inpatient Hospital Stay (HOSPITAL_COMMUNITY): Payer: BC Managed Care – PPO

## 2023-07-17 DIAGNOSIS — J90 Pleural effusion, not elsewhere classified: Secondary | ICD-10-CM | POA: Diagnosis not present

## 2023-07-17 DIAGNOSIS — I5021 Acute systolic (congestive) heart failure: Secondary | ICD-10-CM | POA: Diagnosis not present

## 2023-07-17 LAB — LUPUS ANTICOAGULANT PANEL
DRVVT: 65.3 s — ABNORMAL HIGH (ref 0.0–47.0)
PTT Lupus Anticoagulant: 38.2 s (ref 0.0–43.5)

## 2023-07-17 LAB — CBC
HCT: 46.1 % (ref 39.0–52.0)
Hemoglobin: 15.5 g/dL (ref 13.0–17.0)
MCH: 32.4 pg (ref 26.0–34.0)
MCHC: 33.6 g/dL (ref 30.0–36.0)
MCV: 96.2 fL (ref 80.0–100.0)
Platelets: 365 10*3/uL (ref 150–400)
RBC: 4.79 MIL/uL (ref 4.22–5.81)
RDW: 13.3 % (ref 11.5–15.5)
WBC: 6.2 10*3/uL (ref 4.0–10.5)
nRBC: 0 % (ref 0.0–0.2)

## 2023-07-17 LAB — BASIC METABOLIC PANEL
Anion gap: 10 (ref 5–15)
BUN: 16 mg/dL (ref 6–20)
CO2: 24 mmol/L (ref 22–32)
Calcium: 8.8 mg/dL — ABNORMAL LOW (ref 8.9–10.3)
Chloride: 102 mmol/L (ref 98–111)
Creatinine, Ser: 0.91 mg/dL (ref 0.61–1.24)
GFR, Estimated: >60 mL/min (ref >60)
Glucose, Bld: 104 mg/dL — ABNORMAL HIGH (ref 70–99)
Potassium: 4.3 mmol/L (ref 3.5–5.1)
Sodium: 136 mmol/L (ref 135–145)

## 2023-07-17 LAB — CARDIOLIPIN ANTIBODIES, IGG, IGM, IGA
Anticardiolipin IgA: <9 [APL'U]/mL (ref 0–11)
Anticardiolipin IgG: <9 [GPL'U]/mL (ref 0–14)
Anticardiolipin IgM: <9 [MPL'U]/mL (ref 0–12)

## 2023-07-17 LAB — MAGNESIUM: Magnesium: 2 mg/dL (ref 1.7–2.4)

## 2023-07-17 LAB — DRVVT MIX: dRVVT Mix: 45.6 s — ABNORMAL HIGH (ref 0.0–40.4)

## 2023-07-17 LAB — DRVVT CONFIRM: dRVVT Confirm: 1 {ratio} (ref 0.8–1.2)

## 2023-07-17 LAB — DIGOXIN LEVEL: Digoxin Level: 0.4 ng/mL — ABNORMAL LOW (ref 0.8–2.0)

## 2023-07-17 MED ORDER — FAMOTIDINE IN NACL 20-0.9 MG/50ML-% IV SOLN
20.0000 mg | Freq: Two times a day (BID) | INTRAVENOUS | Status: DC
  Administered 2023-07-17 – 2023-07-20 (×7): 20 mg via INTRAVENOUS
  Filled 2023-07-17 (×8): qty 50

## 2023-07-17 MED ORDER — DIPHENHYDRAMINE HCL 50 MG/ML IJ SOLN
INTRAMUSCULAR | Status: AC
  Filled 2023-07-17: qty 1

## 2023-07-17 MED ORDER — METHYLPREDNISOLONE SODIUM SUCC 40 MG IJ SOLR
INTRAMUSCULAR | Status: AC
  Filled 2023-07-17: qty 1

## 2023-07-17 MED ORDER — GADOBUTROL 1 MMOL/ML IV SOLN
10.0000 mL | Freq: Once | INTRAVENOUS | Status: AC | PRN
  Administered 2023-07-17: 10 mL via INTRAVENOUS

## 2023-07-17 MED ORDER — FUROSEMIDE 10 MG/ML IJ SOLN
40.0000 mg | Freq: Once | INTRAMUSCULAR | Status: AC
  Administered 2023-07-17: 40 mg via INTRAVENOUS
  Filled 2023-07-17: qty 4

## 2023-07-17 MED ORDER — METHYLPREDNISOLONE SODIUM SUCC 40 MG IJ SOLR
40.0000 mg | Freq: Two times a day (BID) | INTRAMUSCULAR | Status: AC
  Administered 2023-07-17 – 2023-07-18 (×2): 40 mg via INTRAVENOUS
  Filled 2023-07-17 (×2): qty 1

## 2023-07-17 MED ORDER — DAPAGLIFLOZIN PROPANEDIOL 10 MG PO TABS
10.0000 mg | ORAL_TABLET | Freq: Every day | ORAL | Status: DC
  Administered 2023-07-17 – 2023-07-20 (×4): 10 mg via ORAL
  Filled 2023-07-17 (×4): qty 1

## 2023-07-17 MED ORDER — DIPHENHYDRAMINE HCL 50 MG/ML IJ SOLN
50.0000 mg | Freq: Once | INTRAMUSCULAR | Status: AC
  Administered 2023-07-17: 50 mg via INTRAVENOUS

## 2023-07-17 MED ORDER — DIPHENHYDRAMINE HCL 25 MG PO CAPS: 25.0000 mg | ORAL_CAPSULE | Freq: Four times a day (QID) | ORAL | Status: DC | PRN

## 2023-07-17 MED ORDER — DIPHENHYDRAMINE HCL 25 MG PO CAPS: 50.0000 mg | ORAL_CAPSULE | Freq: Once | ORAL | Status: AC

## 2023-07-17 MED ORDER — METHYLPREDNISOLONE SODIUM SUCC 40 MG IJ SOLR
40.0000 mg | Freq: Once | INTRAMUSCULAR | Status: AC
  Administered 2023-07-17: 40 mg via INTRAVENOUS

## 2023-07-17 NOTE — Progress Notes (Signed)
 Patient ID: Anthony Singleton, male   DOB: [birth year], 53 y.o.   MRN: [id number]     Advanced Heart Failure Rounding Note  Cardiologist: None  Chief Complaint: CHF Subjective:    Patient seen in MRI. He developed nausea after getting gadolinium but did not vomit and became short of breath.  Symptoms improved with Benadryl and he was put on oxygen and given a dose of Solumedrol.   BP has been stable. Prior to going to MRI this morning he had no complaints.    Objective:   Weight Range: 93 kg Body mass index is 23.69 kg/m.   Vital Signs:   Temp:  [97.8 F (36.6 C)-98.7 F (37.1 C)] 98.5 F (36.9 C) (02/21 0634) Pulse Rate:  [0-116] 108 (02/20 1300) Resp:  [12-27] 20 (02/21 0634) BP: (97-112)/(70-89) 112/75 (02/21 0825) SpO2:  [94 %-100 %] 97 % (02/21 0825) Weight:  [93 kg] 93 kg (02/21 0500) Last BM Date : 07/16/23  Weight change: Filed Weights   07/16/23 0500 07/16/23 0635 07/17/23 0500  Weight: 96.6 kg 96.2 kg 93 kg    Intake/Output:   Intake/Output Summary (Last 24 hours) at 07/17/2023 0836 Last data filed at 07/17/2023 0630 Gross per 24 hour  Intake 243 ml  Output 802 ml  Net -559 ml      Physical Exam    General: NAD Neck: JVP does not appear significantly elevated (have to look on left with right internal jugular thrombosis), no thyromegaly or thyroid nodule.  Lungs: Decreased BS on right.  CV: Nondisplaced PMI.  Heart regular S1/S2, no S3/S4, no murmur.  No peripheral edema.   Abdomen: Soft, nontender, no hepatosplenomegaly, no distention.  Skin: Intact without lesions or rashes.  Neurologic: Drowsy after Benadryl but answers questions.  Extremities: No clubbing or cyanosis.  HEENT: Normal.    Telemetry   NSR 100s (personally reviewed)   Labs    CBC Recent Labs    07/16/23 0507 07/17/23 0252  WBC 6.5 6.2  HGB 14.9 15.5  HCT 45.0 46.1  MCV 97.0 96.2  PLT 335 365   Basic Metabolic Panel Recent Labs    40/98/11 0507 07/17/23 0252   NA 136 136  K 4.2 4.3  CL 104 102  CO2 24 24  GLUCOSE 97 104*  BUN 15 16  CREATININE 0.93 0.91  CALCIUM 8.9 8.8*  MG  --  2.0   Liver Function Tests No results for input(s): "AST", "ALT", "ALKPHOS", "BILITOT", "PROT", "ALBUMIN" in the last 72 hours. No results for input(s): "LIPASE", "AMYLASE" in the last 72 hours. Cardiac Enzymes No results for input(s): "CKTOTAL", "CKMB", "CKMBINDEX", "TROPONINI" in the last 72 hours.  BNP: BNP (last 3 results) Recent Labs    07/07/23 2144 07/09/23 0841 07/15/23 0449  BNP 1,504.8* 1,055.2* 495.1*    ProBNP (last 3 results) No results for input(s): "PROBNP" in the last 8760 hours.   D-Dimer No results for input(s): "DDIMER" in the last 72 hours. Hemoglobin A1C Recent Labs    07/16/23 0507  HGBA1C 5.7*   Fasting Lipid Panel Recent Labs    07/15/23 0449  CHOL 157  HDL 34*  LDLCALC 106*  TRIG 86  CHOLHDL 4.6  LDLDIRECT 119*   Thyroid Function Tests No results for input(s): "TSH", "T4TOTAL", "T3FREE", "THYROIDAB" in the last 72 hours.  Invalid input(s): "FREET3"  Other results:   Imaging    CARDIAC CATHETERIZATION Result Date: 07/16/2023 No significant CAD noted. Nonischemic cardiomyopathy.     Medications:  Scheduled Medications:  apixaban  5 mg Oral BID   Chlorhexidine Gluconate Cloth  6 each Topical Daily   dapagliflozin propanediol  10 mg Oral Daily   digoxin  0.125 mg Oral Daily   feeding supplement  237 mL Oral TID BM   folic acid  1 mg Oral Daily   furosemide  40 mg Intravenous Once   lidocaine  1 patch Transdermal Q24H   multivitamin with minerals  1 tablet Oral Daily   polyethylene glycol  17 g Oral Daily   rosuvastatin  10 mg Oral Daily   sacubitril-valsartan  1 tablet Oral BID   senna-docusate  2 tablet Oral QHS   sodium chloride flush  10 mL Intrapleural Q8H   sodium chloride flush  3 mL Intravenous Q12H   spironolactone  12.5 mg Oral Daily   thiamine  100 mg Oral Daily     Infusions:  sodium chloride     piperacillin-tazobactam (ZOSYN)  IV 3.375 g (07/17/23 0637)    PRN Medications: sodium chloride, acetaminophen, cyclobenzaprine, haloperidol lactate, HYDROcodone bit-homatropine, HYDROmorphone (DILAUDID) injection, hydrOXYzine, melatonin, ondansetron (ZOFRAN) IV, ondansetron (ZOFRAN) IV, mouth rinse, oxyCODONE, sodium chloride flush   Assessment/Plan   1. Acute systolic CHF: Nonischemic cardiomyopathy.  Echo this admission with  EF 15% with global hypokinesis and moderate LV dilation; RV function moderately reduced; mild MR.  Patient has no family history of cardiomyopathy, he does report family members with CAD though not premature.  Patient has a history of methamphetamine abuse which can cause a dilated cardiomyopathy though he says he has quit x 5-6 months. Coronary angiography showed no significant CAD.  RHC was not done due to recent venous thromboembolism.  He does not look markedly volume overloaded on exam but became short of breath in MRI after developing nausea post-gadolinium.   - Lasix 40 mg IV x 1 and will get CXR.  - Continue digoxin 0.125 daily - Farxiga 10 mg daily.  - Continue spironolactone 12.5 daily . - Continue Entresto 24/26 bid.  - As above, unable to complete cardiac MRI this morning.  2. 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  CTA chest showed PE on left. Patient does have a history of RIJ CVL at prior hospitalization at [facility] it appears.  Clotting could be due to slow flow with CHF, but I do worry about a hypercoagulable disorder.  - Continue apixaban.  - I have sent Factor V Leiden, prothrombin gene mutation, and antiphospholipid workup.  3. R-sided PNA: He remains on Zosyn per primary team.  4. Polysubstance abuse: Marijuana and methamphetamine.  He says he has been off meth for about 5 months.   Length of Stay: 9  Marca Ancona, MD   07/17/2023, 8:36 AM  Advanced Heart Failure Team Pager [phone] (M-F; 7a - 5p)  Please contact [facility] for night-coverage after hours (5p -7a ) and weekends on [email or web address]

## 2023-07-17 NOTE — Progress Notes (Signed)
 IR BRIEF NOTE:  I responded to a possible MRI gadolinium contrast reaction on this patient. Upon arrival in IR, Rapid response team was present. Patient was experiencing shortness of breath with tachypnea and tachycardia, HR 113, RR 28,  and some nausea without vomiting. Patient stated his left leg felt warm shortly after contrast administration, with subsequent malaise and shortness of breath. MRI was aborted proximately 6 minutes into study. Patient was otherwise hemodynamically stable. In case of true contrast reaction, patient received 50mg  of Benadryl and 40mg  Solumedrol. Patient's shortness of breath improved, HR down to 103. MD arrived and assumed care.  Buzzy Han, PA-C.   Electronically Signed: Sable Feil, PA-C 07/17/2023, 9:22 AM
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
## 2023-07-17 NOTE — Significant Event (Addendum)
 Rapid Response Event Note   Reason for Call :  Shortness of breath & nausea while in MRI following contrast  Initial Focused Assessment:  Pt lying in bed. Complains of shortness of breath. Clear breath sounds throughout. Skin is warm, dry, pale. Pt endorses nausea with shortness of breath. Pt denies feeling anxious or claustrophobic prior to symptom onset.  Tachypneic, RR 28. Tachycardic, HR 118 bpm. SpO2 on 2LNC, 86%.   VS: BP 112/75, HR 104, RR 18, SpO2 97% on 5LNC  Interventions:  -Solumedrol and Benadryl per C. Carim, PA radiology -Supplemental oxygen  Plan of Care:  -MRI aborted, pt returned to PCU   Event Summary:  MD Notified: Dr. Shirlee Latch and Dr. Sunnie Nielsen per primary RN Call Time: [id number] Arrival Time: 0810 End Time: 9604  Jennye Moccasin, RN

## 2023-07-17 NOTE — Progress Notes (Signed)
 Mobility Specialist Progress Note:   07/17/23 0941  Mobility  Activity Ambulated with assistance to bathroom  Level of Assistance Standby assist, set-up cues, supervision of patient - no hands on  Assistive Device None;Other (Comment) (IV pole)  Distance Ambulated (ft) 30 ft  Activity Response Tolerated well  Mobility Referral Yes  Mobility visit 1 Mobility  Mobility Specialist Start Time (ACUTE ONLY) 0941  Mobility Specialist Stop Time (ACUTE ONLY) 0951  Mobility Specialist Time Calculation (min) (ACUTE ONLY) 10 min   Pt agreeable to mobility session. Required no physical assistance throughout ambulation with and without IV pole. Pt back in bed with all needs met.   Addison Lank Mobility Specialist Please contact via SecureChat or  Rehab office at [phone]

## 2023-07-17 NOTE — Plan of Care (Signed)
  Problem: Education: Goal: Knowledge of General Education information will improve Description: Including pain rating scale, medication(s)/side effects and non-pharmacologic comfort measures Outcome: Progressing   Problem: Health Behavior/Discharge Planning: Goal: Ability to manage health-related needs will improve Outcome: Progressing   Problem: Clinical Measurements: Goal: Ability to maintain clinical measurements within normal limits will improve Outcome: Progressing Goal: Will remain free from infection Outcome: Progressing Goal: Diagnostic test results will improve Outcome: Progressing Goal: Respiratory complications will improve Outcome: Progressing Goal: Cardiovascular complication will be avoided Outcome: Progressing   Problem: Activity: Goal: Risk for activity intolerance will decrease Outcome: Progressing   Problem: Nutrition: Goal: Adequate nutrition will be maintained Outcome: Progressing   Problem: Coping: Goal: Level of anxiety will decrease Outcome: Progressing   Problem: Elimination: Goal: Will not experience complications related to bowel motility Outcome: Progressing Goal: Will not experience complications related to urinary retention Outcome: Progressing   Problem: Pain Managment: Goal: General experience of comfort will improve and/or be controlled Outcome: Progressing   Problem: Safety: Goal: Ability to remain free from injury will improve Outcome: Progressing   Problem: Skin Integrity: Goal: Risk for impaired skin integrity will decrease Outcome: Progressing   Problem: Activity: Goal: Ability to tolerate increased activity will improve Outcome: Progressing   Problem: Clinical Measurements: Goal: Ability to maintain a body temperature in the normal range will improve Outcome: Progressing   Problem: Respiratory: Goal: Ability to maintain adequate ventilation will improve Outcome: Progressing Goal: Ability to maintain a clear airway  will improve Outcome: Progressing   Problem: Education: Goal: Ability to demonstrate management of disease process will improve Outcome: Progressing Goal: Ability to verbalize understanding of medication therapies will improve Outcome: Progressing Goal: Individualized Educational Video(s) Outcome: Progressing   Problem: Activity: Goal: Capacity to carry out activities will improve Outcome: Progressing   Problem: Cardiac: Goal: Ability to achieve and maintain adequate cardiopulmonary perfusion will improve Outcome: Progressing   Problem: Activity: Goal: Ability to tolerate increased activity will improve Outcome: Progressing   Problem: Clinical Measurements: Goal: Ability to maintain a body temperature in the normal range will improve Outcome: Progressing   Problem: Respiratory: Goal: Ability to maintain adequate ventilation will improve Outcome: Progressing Goal: Ability to maintain a clear airway will improve Outcome: Progressing   Problem: Education: Goal: Understanding of CV disease, CV risk reduction, and recovery process will improve Outcome: Progressing Goal: Individualized Educational Video(s) Outcome: Progressing   Problem: Activity: Goal: Ability to return to baseline activity level will improve Outcome: Progressing   Problem: Cardiovascular: Goal: Ability to achieve and maintain adequate cardiovascular perfusion will improve Outcome: Progressing Goal: Vascular access site(s) Level 0-1 will be maintained Outcome: Progressing   Problem: Health Behavior/Discharge Planning: Goal: Ability to safely manage health-related needs after discharge will improve Outcome: Progressing
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
## 2023-07-18 DIAGNOSIS — J90 Pleural effusion, not elsewhere classified: Secondary | ICD-10-CM | POA: Diagnosis not present

## 2023-07-18 LAB — LIPOPROTEIN A (LPA): Lipoprotein (a): 222.7 nmol/L — ABNORMAL HIGH (ref <75.0)

## 2023-07-18 LAB — CBC
HCT: 45.5 % (ref 39.0–52.0)
Hemoglobin: 15.5 g/dL (ref 13.0–17.0)
MCH: 32 pg (ref 26.0–34.0)
MCHC: 34.1 g/dL (ref 30.0–36.0)
MCV: 94 fL (ref 80.0–100.0)
Platelets: 368 10*3/uL (ref 150–400)
RBC: 4.84 MIL/uL (ref 4.22–5.81)
RDW: 13.2 % (ref 11.5–15.5)
WBC: 8.8 10*3/uL (ref 4.0–10.5)
nRBC: 0 % (ref 0.0–0.2)

## 2023-07-18 LAB — BASIC METABOLIC PANEL
Anion gap: 13 (ref 5–15)
BUN: 22 mg/dL — ABNORMAL HIGH (ref 6–20)
CO2: 22 mmol/L (ref 22–32)
Calcium: 9.5 mg/dL (ref 8.9–10.3)
Chloride: 102 mmol/L (ref 98–111)
Creatinine, Ser: 0.97 mg/dL (ref 0.61–1.24)
GFR, Estimated: >60 mL/min (ref >60)
Glucose, Bld: 111 mg/dL — ABNORMAL HIGH (ref 70–99)
Potassium: 4.9 mmol/L (ref 3.5–5.1)
Sodium: 137 mmol/L (ref 135–145)

## 2023-07-18 LAB — MAGNESIUM: Magnesium: 2.1 mg/dL (ref 1.7–2.4)

## 2023-07-18 MED ORDER — FUROSEMIDE 10 MG/ML IJ SOLN
40.0000 mg | Freq: Once | INTRAMUSCULAR | Status: AC
  Administered 2023-07-18: 40 mg via INTRAVENOUS
  Filled 2023-07-18: qty 4

## 2023-07-18 NOTE — Plan of Care (Signed)
  Problem: Clinical Measurements: Goal: Respiratory complications will improve Outcome: Progressing Goal: Cardiovascular complication will be avoided Outcome: Progressing   Problem: Activity: Goal: Risk for activity intolerance will decrease Outcome: Progressing   Problem: Elimination: Goal: Will not experience complications related to bowel motility Outcome: Progressing   Problem: Safety: Goal: Ability to remain free from injury will improve Outcome: Progressing   Problem: Respiratory: Goal: Ability to maintain adequate ventilation will improve Outcome: Progressing   Problem: Cardiac: Goal: Ability to achieve and maintain adequate cardiopulmonary perfusion will improve Outcome: Progressing   Problem: Respiratory: Goal: Ability to maintain adequate ventilation will improve Outcome: Progressing

## 2023-07-18 NOTE — Progress Notes (Signed)
 Progress Note  Patient Name: Anthony Singleton Date of Encounter: 07/18/2023  Primary Cardiologist: None   Subjective   Remains dyspneic but improved some.   Inpatient Medications    Scheduled Meds:  apixaban  5 mg Oral BID   Chlorhexidine Gluconate Cloth  6 each Topical Daily   dapagliflozin propanediol  10 mg Oral Daily   digoxin  0.125 mg Oral Daily   feeding supplement  237 mL Oral TID BM   folic acid  1 mg Oral Daily   lidocaine  1 patch Transdermal Q24H   multivitamin with minerals  1 tablet Oral Daily   polyethylene glycol  17 g Oral Daily   rosuvastatin  10 mg Oral Daily   sacubitril-valsartan  1 tablet Oral BID   senna-docusate  2 tablet Oral QHS   sodium chloride flush  10 mL Intrapleural Q8H   sodium chloride flush  3 mL Intravenous Q12H   spironolactone  12.5 mg Oral Daily   thiamine  100 mg Oral Daily   Continuous Infusions:  famotidine (PEPCID) IV Stopped (07/17/23 2150)   PRN Meds: acetaminophen, cyclobenzaprine, diphenhydrAMINE, haloperidol lactate, HYDROcodone bit-homatropine, HYDROmorphone (DILAUDID) injection, melatonin, ondansetron (ZOFRAN) IV, ondansetron (ZOFRAN) IV, mouth rinse, oxyCODONE, sodium chloride flush   Vital Signs    Vitals:   07/18/23 0510 07/18/23 0511 07/18/23 0512 07/18/23 0700  BP:    110/70  Pulse: (!) 102 (!) 102 (!) 104 (!) 114  Resp:    20  Temp:    (!) 97.1 F (36.2 C)  TempSrc:    Oral  SpO2: 93% 93% 93% 97%  Weight:      Height:        Intake/Output Summary (Last 24 hours) at 07/18/2023 1025 Last data filed at 07/18/2023 0700 Gross per 24 hour  Intake 1648.62 ml  Output 860 ml  Net 788.62 ml   Filed Weights   07/16/23 0635 07/17/23 0500 07/18/23 0508  Weight: 96.2 kg 93 kg 98.1 kg    Telemetry    ST at 110. - Personally Reviewed  ECG    none - Personally Reviewed  Physical Exam   GEN: No acute distress.   Neck: unable to assess JVD Cardiac: RRR, no murmurs, rubs, with soft S3 gallops.   Respiratory: rales in bases bilaterally. GI: Soft, nontender, non-distended  MS: No edema; No deformity. Neuro:  Nonfocal  Psych: Normal affect   Labs    Chemistry Recent Labs  Lab 07/11/23 2000 07/12/23 0401 07/16/23 0507 07/17/23 0252 07/18/23 0238  NA  --    < > 136 136 137  K  --    < > 4.2 4.3 4.9  CL  --    < > 104 102 102  CO2  --    < > 24 24 22   GLUCOSE  --    < > 97 104* 111*  BUN  --    < > 15 16 22*  CREATININE  --    < > 0.93 0.91 0.97  CALCIUM  --    < > 8.9 8.8* 9.5  PROT 6.9  --   --   --   --   ALBUMIN 2.2*  --   --   --   --   GFRNONAA  --    < > >60 >60 >60  ANIONGAP  --    < > 8 10 13    < > = values in this interval not displayed.     Hematology Recent  Labs  Lab 07/16/23 0507 07/17/23 0252 07/18/23 0238  WBC 6.5 6.2 8.8  RBC 4.64 4.79 4.84  HGB 14.9 15.5 15.5  HCT 45.0 46.1 45.5  MCV 97.0 96.2 94.0  MCH 32.1 32.4 32.0  MCHC 33.1 33.6 34.1  RDW 13.6 13.3 13.2  PLT 335 365 368    Cardiac EnzymesNo results for input(s): "TROPONINI" in the last 168 hours. No results for input(s): "TROPIPOC" in the last 168 hours.   BNP Recent Labs  Lab 07/15/23 0449  BNP 495.1*     DDimer No results for input(s): "DDIMER" in the last 168 hours.   Radiology    MR CARDIAC MORPHOLOGY W WO CONTRAST Result Date: 07/17/2023 CLINICAL DATA:  Cardiomyopathy of uncertain etiology EXAM: CARDIAC MRI TECHNIQUE: The patient was scanned on a 1.5 Tesla GE magnet. A dedicated cardiac coil was used. Functional imaging was done using Fiesta sequences. 2,3, and 4 chamber views were done to assess for RWMA's. Modified Simpson's rule using a short axis stack was used to calculate an ejection fraction on a dedicated work [employer]. The patient received 10 cc of Gadavist. Shortly after getting contrast, patient developed nausea and dyspnea, had to stop study. FINDINGS: The MRI was not complete as the patient developed nausea and dyspnea after contrast and  study was aborted. Limited images of the lung fields showed a small right pleural effusion and airspace disease at the right lung base. Mildly dilated left ventricular size and with normal wall thickness. Severe global hypokinesis, LV EF 15%. Normal RV size with severe systolic dysfunction, EF 12%. No thrombus noted in the LV or RV. Normal left and right atrial sizes. Trileaflet aortic valve, no stenosis or regurgitation. Limited, incomplete delayed enhancement images obtained. There was a small area of mid-wall basal inferoseptal RV insertion late gadolinium enhancement (LGE). MEASUREMENTS: MEASUREMENTS LVEDV 261 mL LVEDVi 115 mL/m2 LVSV 38 mL LVEF 15% RVEDV 185 mL RVEDVi 82 mL/m2 RVSV 23 mL RVEF 12% T1 1093 IMPRESSION: 1. Incomplete study, terminated early due to nausea/dyspnea after contrast. 2.  Mild LV dilation with severe global hypokinesis, EF 15%. 3.  Normal RV size with RV EF 12%. 4. Incomplete delayed enhancement imaging. There was a small area LGE at the basal inferoseptal RV insertion site. This is a nonspecific finding and suggests pressure/volume overload. Dalton Mclean Electronically Signed   By: Marca Ancona M.D.   On: 07/17/2023 13:48   DG CHEST PORT 1 VIEW Result Date: 07/17/2023 CLINICAL DATA:  [address] CHF (congestive heart failure) (HCC) [id number] EXAM: PORTABLE CHEST 1 VIEW COMPARISON:  Chest x-ray 07/15/2023, CT angio chest 07/10/2023 FINDINGS: The heart and mediastinal contours are within normal limits. Right lower lobe airspace opacity. No new focal consolidation. No pulmonary edema. Persistent possibly loculated small right pleural effusion. No left pleural effusion. No pneumothorax. No acute osseous abnormality. IMPRESSION: 1. No change. 2. Persistent possibly loculated small right pleural effusion. 3.  Right lower lobe airspace opacity. Electronically Signed   By: Tish Frederickson M.D.   On: 07/17/2023 12:51   CARDIAC CATHETERIZATION Result Date: 07/16/2023 No significant CAD noted.  Nonischemic cardiomyopathy.    Cardiac Studies   See above  Patient Profile     53 y.o. male admitted with acute systolic heart failure  Assessment & Plan    Acute systolic heart failure - EF 15%. MRI aborted. See above. Continue IV lasix. Continue other GDMT. 2. DVT - Continue eliquis. No hypercoag workup ongoing.     For questions or  updates, please contact [facility] Please consult [email or web address] for contact info under Cardiology/STEMI.      Signed, Lewayne Bunting, MD  07/18/2023, 10:25 AM

## 2023-07-18 NOTE — Progress Notes (Signed)
 Mobility Specialist Progress Note;   07/18/23 1410  Mobility  Activity Ambulated independently in hallway  Level of Assistance Standby assist, set-up cues, supervision of patient - no hands on  Assistive Device None  Distance Ambulated (ft) 400 ft  Activity Response Tolerated well  Mobility Referral Yes  Mobility visit 1 Mobility  Mobility Specialist Start Time (ACUTE ONLY) 1410  Mobility Specialist Stop Time (ACUTE ONLY) 1425  Mobility Specialist Time Calculation (min) (ACUTE ONLY) 15 min   Pt agreeable to mobility. Required no physical assistance during ambulation, SV. HR up to 123 w/ activity. No c/o when asked. Pt returned safely back to bed with all needs met.   Caesar Bookman Mobility Specialist Please contact via SecureChat or [employer] [phone]
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
## 2023-07-19 DIAGNOSIS — J90 Pleural effusion, not elsewhere classified: Secondary | ICD-10-CM | POA: Diagnosis not present

## 2023-07-19 LAB — CBC
HCT: 46.6 % (ref 39.0–52.0)
Hemoglobin: 15.7 g/dL (ref 13.0–17.0)
MCH: 31.5 pg (ref 26.0–34.0)
MCHC: 33.7 g/dL (ref 30.0–36.0)
MCV: 93.4 fL (ref 80.0–100.0)
Platelets: 440 10*3/uL — ABNORMAL HIGH (ref 150–400)
RBC: 4.99 MIL/uL (ref 4.22–5.81)
RDW: 13.4 % (ref 11.5–15.5)
WBC: 8.7 10*3/uL (ref 4.0–10.5)
nRBC: 0 % (ref 0.0–0.2)

## 2023-07-19 LAB — BASIC METABOLIC PANEL
Anion gap: 9 (ref 5–15)
BUN: 27 mg/dL — ABNORMAL HIGH (ref 6–20)
CO2: 31 mmol/L (ref 22–32)
Calcium: 9.2 mg/dL (ref 8.9–10.3)
Chloride: 95 mmol/L — ABNORMAL LOW (ref 98–111)
Creatinine, Ser: 1.12 mg/dL (ref 0.61–1.24)
GFR, Estimated: >60 mL/min (ref >60)
Glucose, Bld: 97 mg/dL (ref 70–99)
Potassium: 4.3 mmol/L (ref 3.5–5.1)
Sodium: 135 mmol/L (ref 135–145)

## 2023-07-19 LAB — MAGNESIUM: Magnesium: 2.3 mg/dL (ref 1.7–2.4)

## 2023-07-19 MED ORDER — PIPERACILLIN-TAZOBACTAM 3.375 G IVPB
3.3750 g | Freq: Three times a day (TID) | INTRAVENOUS | Status: DC
  Administered 2023-07-19 – 2023-07-20 (×3): 3.375 g via INTRAVENOUS
  Filled 2023-07-19 (×5): qty 50

## 2023-07-19 NOTE — Progress Notes (Signed)
 Progress Note  Patient Name: Anthony Singleton Date of Encounter: 07/19/2023  Primary Cardiologist: None   Subjective   No chest pain. Dyspnea improved.   Inpatient Medications    Scheduled Meds:  apixaban  5 mg Oral BID   Chlorhexidine Gluconate Cloth  6 each Topical Daily   dapagliflozin propanediol  10 mg Oral Daily   digoxin  0.125 mg Oral Daily   feeding supplement  237 mL Oral TID BM   folic acid  1 mg Oral Daily   lidocaine  1 patch Transdermal Q24H   multivitamin with minerals  1 tablet Oral Daily   polyethylene glycol  17 g Oral Daily   rosuvastatin  10 mg Oral Daily   sacubitril-valsartan  1 tablet Oral BID   senna-docusate  2 tablet Oral QHS   sodium chloride flush  10 mL Intrapleural Q8H   sodium chloride flush  3 mL Intravenous Q12H   spironolactone  12.5 mg Oral Daily   thiamine  100 mg Oral Daily   Continuous Infusions:  famotidine (PEPCID) IV Stopped (07/18/23 2203)   PRN Meds: acetaminophen, cyclobenzaprine, diphenhydrAMINE, haloperidol lactate, HYDROcodone bit-homatropine, HYDROmorphone (DILAUDID) injection, melatonin, ondansetron (ZOFRAN) IV, ondansetron (ZOFRAN) IV, mouth rinse, oxyCODONE, sodium chloride flush   Vital Signs    Vitals:   07/18/23 1949 07/18/23 2338 07/19/23 0352 07/19/23 0356  BP: 111/83 105/77 105/82   Pulse: (!) 103 60 (!) 107   Resp: 17 20 20    Temp: 98.3 F (36.8 C) 98.8 F (37.1 C) 98.4 F (36.9 C)   TempSrc: Oral Oral Oral   SpO2: 98% 99% 95%   Weight:    91.6 kg  Height:        Intake/Output Summary (Last 24 hours) at 07/19/2023 0937 Last data filed at 07/19/2023 1610 Gross per 24 hour  Intake 1656.27 ml  Output 2525 ml  Net -868.73 ml   Filed Weights   07/17/23 0500 07/18/23 0508 07/19/23 0356  Weight: 93 kg 98.1 kg 91.6 kg    Telemetry    Sinus tachy - Personally Reviewed  ECG    none - Personally Reviewed  Physical Exam   GEN: No acute distress.   Neck: No JVD Cardiac: Reg tachy, no  murmurs, rubs; summartion gallop is present.  Respiratory: Clear to auscultation bilaterally. GI: Soft, nontender, non-distended  MS: No edema; No deformity. Neuro:  Nonfocal  Psych: Normal affect   Labs    Chemistry Recent Labs  Lab 07/16/23 0507 07/17/23 0252 07/18/23 0238  NA 136 136 137  K 4.2 4.3 4.9  CL 104 102 102  CO2 24 24 22   GLUCOSE 97 104* 111*  BUN 15 16 22*  CREATININE 0.93 0.91 0.97  CALCIUM 8.9 8.8* 9.5  GFRNONAA >60 >60 >60  ANIONGAP 8 10 13      Hematology Recent Labs  Lab 07/17/23 0252 07/18/23 0238 07/19/23 0225  WBC 6.2 8.8 8.7  RBC 4.79 4.84 4.99  HGB 15.5 15.5 15.7  HCT 46.1 45.5 46.6  MCV 96.2 94.0 93.4  MCH 32.4 32.0 31.5  MCHC 33.6 34.1 33.7  RDW 13.3 13.2 13.4  PLT 365 368 440*    Cardiac EnzymesNo results for input(s): "TROPONINI" in the last 168 hours. No results for input(s): "TROPIPOC" in the last 168 hours.   BNP Recent Labs  Lab 07/15/23 0449  BNP 495.1*     DDimer No results for input(s): "DDIMER" in the last 168 hours.   Radiology    No results  found.  Cardiac Studies   none  Patient Profile     53 y.o. male admitted with acute systolic heart failure.  Assessment & Plan    Acute systolic heart failure - he has had a nice diuresis. Continue IV lasix. Hopefully he can had his MRI done tomorrow. Question is whether a different contrast agent needed. He got nauseated with the initial MRI. We will ask Dr. Shirlee Latch to weigh in on this.  DVT - he will continue eliquis.     For questions or updates, please contact [facility] Please consult [email or web address] for contact info under Cardiology/STEMI.      Signed, Lewayne Bunting, MD  07/19/2023, 9:37 AM
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
## 2023-07-19 NOTE — Progress Notes (Signed)
 Mobility Specialist Progress Note;   07/19/23 1505  Mobility  Activity Ambulated with assistance in hallway  Level of Assistance Standby assist, set-up cues, supervision of patient - no hands on  Assistive Device Other (Comment) (IV pole)  Distance Ambulated (ft) 700 ft  Activity Response Tolerated well  Mobility Referral Yes  Mobility visit 1 Mobility  Mobility Specialist Start Time (ACUTE ONLY) 1505  Mobility Specialist Stop Time (ACUTE ONLY) 1525  Mobility Specialist Time Calculation (min) (ACUTE ONLY) 20 min   Pt eager for mobility. Required no physical assistance during ambulation, SV. HR up to 130 w/ activity. No c/o during session. Pt returned back to bed with all needs met.   Caesar Bookman Mobility Specialist Please contact via SecureChat or [employer] [phone]

## 2023-07-19 NOTE — Plan of Care (Signed)
  Problem: Activity: Goal: Risk for activity intolerance will decrease Outcome: Progressing   Problem: Elimination: Goal: Will not experience complications related to bowel motility Outcome: Progressing Goal: Will not experience complications related to urinary retention Outcome: Progressing   Problem: Safety: Goal: Ability to remain free from injury will improve Outcome: Progressing   Problem: Respiratory: Goal: Ability to maintain adequate ventilation will improve Outcome: Progressing Goal: Ability to maintain a clear airway will improve Outcome: Progressing   Problem: Cardiac: Goal: Ability to achieve and maintain adequate cardiopulmonary perfusion will improve Outcome: Progressing   Problem: Respiratory: Goal: Ability to maintain adequate ventilation will improve Outcome: Progressing

## 2023-07-20 ENCOUNTER — Other Ambulatory Visit (HOSPITAL_COMMUNITY): Payer: Self-pay

## 2023-07-20 ENCOUNTER — Telehealth: Payer: Self-pay | Admitting: Pulmonary Disease

## 2023-07-20 DIAGNOSIS — J90 Pleural effusion, not elsewhere classified: Secondary | ICD-10-CM | POA: Diagnosis not present

## 2023-07-20 LAB — FACTOR 5 LEIDEN

## 2023-07-20 LAB — BASIC METABOLIC PANEL
Anion gap: 13 (ref 5–15)
BUN: 31 mg/dL — ABNORMAL HIGH (ref 6–20)
CO2: 24 mmol/L (ref 22–32)
Calcium: 9.2 mg/dL (ref 8.9–10.3)
Chloride: 99 mmol/L (ref 98–111)
Creatinine, Ser: 1.18 mg/dL (ref 0.61–1.24)
GFR, Estimated: >60 mL/min (ref >60)
Glucose, Bld: 93 mg/dL (ref 70–99)
Potassium: 4.2 mmol/L (ref 3.5–5.1)
Sodium: 136 mmol/L (ref 135–145)

## 2023-07-20 LAB — CBC
HCT: 45.2 % (ref 39.0–52.0)
Hemoglobin: 15.4 g/dL (ref 13.0–17.0)
MCH: 32.4 pg (ref 26.0–34.0)
MCHC: 34.1 g/dL (ref 30.0–36.0)
MCV: 95 fL (ref 80.0–100.0)
Platelets: 396 10*3/uL (ref 150–400)
RBC: 4.76 MIL/uL (ref 4.22–5.81)
RDW: 13.4 % (ref 11.5–15.5)
WBC: 5.4 10*3/uL (ref 4.0–10.5)
nRBC: 0 % (ref 0.0–0.2)

## 2023-07-20 LAB — MAGNESIUM: Magnesium: 2.2 mg/dL (ref 1.7–2.4)

## 2023-07-20 MED ORDER — DIGOXIN 125 MCG PO TABS
0.1250 mg | ORAL_TABLET | Freq: Every day | ORAL | 0 refills | Status: DC
  Filled 2023-07-20: qty 30, 30d supply, fill #0

## 2023-07-20 MED ORDER — THIAMINE HCL 100 MG PO TABS
100.0000 mg | ORAL_TABLET | Freq: Every day | ORAL | 0 refills | Status: DC
Start: 2023-07-21 — End: 2023-07-30
  Filled 2023-07-20: qty 30, 30d supply, fill #0

## 2023-07-20 MED ORDER — APIXABAN 5 MG PO TABS
5.0000 mg | ORAL_TABLET | Freq: Two times a day (BID) | ORAL | 3 refills | Status: DC
  Filled 2023-07-20: qty 60, 30d supply, fill #0

## 2023-07-20 MED ORDER — ROSUVASTATIN CALCIUM 10 MG PO TABS
10.0000 mg | ORAL_TABLET | Freq: Every day | ORAL | 0 refills | Status: DC
  Filled 2023-07-20: qty 30, 30d supply, fill #0

## 2023-07-20 MED ORDER — SACUBITRIL-VALSARTAN 24-26 MG PO TABS
1.0000 | ORAL_TABLET | Freq: Two times a day (BID) | ORAL | 3 refills | Status: DC
  Filled 2023-07-20: qty 60, 30d supply, fill #0

## 2023-07-20 MED ORDER — SPIRONOLACTONE 25 MG PO TABS
25.0000 mg | ORAL_TABLET | Freq: Every day | ORAL | 2 refills | Status: DC
  Filled 2023-07-20: qty 30, 30d supply, fill #0

## 2023-07-20 MED ORDER — SPIRONOLACTONE 12.5 MG HALF TABLET
12.5000 mg | ORAL_TABLET | Freq: Once | ORAL | Status: AC
  Administered 2023-07-20: 12.5 mg via ORAL
  Filled 2023-07-20: qty 1

## 2023-07-20 MED ORDER — FOLIC ACID 1 MG PO TABS
1.0000 mg | ORAL_TABLET | Freq: Every day | ORAL | 0 refills | Status: DC
  Filled 2023-07-20: qty 30, 30d supply, fill #0

## 2023-07-20 MED ORDER — AMOXICILLIN-POT CLAVULANATE 875-125 MG PO TABS
1.0000 | ORAL_TABLET | Freq: Two times a day (BID) | ORAL | 0 refills | Status: AC
Start: 2023-07-20 — End: 2023-07-23
  Filled 2023-07-20: qty 6, 3d supply, fill #0

## 2023-07-20 MED ORDER — HYDROXYZINE HCL 10 MG PO TABS
10.0000 mg | ORAL_TABLET | Freq: Three times a day (TID) | ORAL | 0 refills | Status: DC | PRN
  Filled 2023-07-20: qty 30, 10d supply, fill #0

## 2023-07-20 MED ORDER — SPIRONOLACTONE 25 MG PO TABS: 25.0000 mg | ORAL_TABLET | Freq: Every day | ORAL | Status: DC

## 2023-07-20 MED ORDER — POLYETHYLENE GLYCOL 3350 17 GM/SCOOP PO POWD
17.0000 g | Freq: Every day | ORAL | 0 refills | Status: DC
  Filled 2023-07-20: qty 238, 14d supply, fill #0

## 2023-07-20 MED ORDER — FUROSEMIDE 20 MG PO TABS
20.0000 mg | ORAL_TABLET | Freq: Every day | ORAL | 11 refills | Status: DC | PRN
  Filled 2023-07-20: qty 30, 30d supply, fill #0

## 2023-07-20 MED ORDER — ADULT MULTIVITAMIN W/MINERALS CH
1.0000 | ORAL_TABLET | Freq: Every day | ORAL | 0 refills | Status: DC
  Filled 2023-07-20: qty 30, 30d supply, fill #0

## 2023-07-20 MED ORDER — DAPAGLIFLOZIN PROPANEDIOL 10 MG PO TABS
10.0000 mg | ORAL_TABLET | Freq: Every day | ORAL | 3 refills | Status: DC
  Filled 2023-07-20: qty 30, 30d supply, fill #0

## 2023-07-20 NOTE — Discharge Summary (Signed)
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 opacification of the pulmonary arteries to the segmental level. Pulmonary embolus, at the LEFT pulmonary arterial bifurcation. Cardiomegaly with multi chamber cardiac enlargement, most prominently within the LEFT ventricle. No pericardial effusion. RV/LV ratio of 0.7. Mediastinum/Nodes: No enlarged mediastinal, hilar, or axillary lymph nodes. Thyroid gland, trachea, and esophagus demonstrate no significant findings. Lungs/Pleura: Pulmonary interstitial thickening, greater on RIGHT small volume RIGHT pleural effusion. Trace RIGHT pneumothorax with  basilar pigtail thoracostomy tube. RIGHT dependent basilar consolidation. The LEFT lung is relatively clear. Upper Abdomen: No acute abnormality. 2.0 x 1.2 cm low density LEFT adrenal lesion, measuring -2 HU. Punctate LEFT renal calculus, incompletely imaged. Musculoskeletal: Stranding with relative obliteration of the normal fat planes along the RIGHT jugular sheath extending to the thoracic inlet. No acute osseous findings. Review of the MIP images confirms the above findings. IMPRESSION: 1. Examination is POSITIVE for LEFT segmental pulmonary emboli, at the pulmonary arterial bifurcation. RV/LV ratio of 0.7. Findings  suggest interval embolization of prior clot-in-transit seen on comparison echocardiogram. 2. Trace RIGHT pneumothorax with basilar pigtail thoracostomy tube. 3. Small volume RIGHT pleural effusion. Dependent pulmonary consolidation is likely to represent atelectasis, however superimposed pneumonia can appear similar. 4. Fat stranding along the RIGHT jugular sheath, correlate for potential recent cannulation attempt. 5. 2 cm low-density LEFT adrenal mass, consistent with lipid-rich benign adenoma. No follow-up imaging is recommended. JACR 2017 Aug; 14(8):1038-44, JCAT 2016 Mar-Apr; 40(2):194-200, Urol J 2006 Spring; 3(2):71-4. 6. Cardiomegaly with LEFT ventricular enlargement. Additional incidental, chronic and senescent findings as above. These results were called by telephone at the time of interpretation on 07/10/2023 at 2:50 p.m. to provider [employer] , who verbally acknowledged these results. Roanna Banning, MD [facility] Radiology Electronically Signed   By: Roanna Banning M.D.   On: 07/10/2023 16:10   ECHOCARDIOGRAM COMPLETE Result Date: 07/10/2023    ECHOCARDIOGRAM REPORT   Patient Name:   MONTAY VANVOORHIS Date of Exam: 07/10/2023 Medical Rec #:  [id number]             Height:       78.0 in Accession #:    [id number]            Weight:        222.0 lb Date of Birth:  [birth year]             BSA:          2.360 m Patient Age:    52 years              BP:           117/85 mmHg Patient Gender: M                     HR:           126 bpm. Exam Location:  Inpatient Procedure: 2D Echo, Cardiac Doppler, Color Doppler and Intracardiac            Opacification Agent (Both Spectral and Color Flow Doppler were            utilized during procedure). Indications:    CHF-Acute Diastolic I50.31  History:        Patient has no prior history of Echocardiogram examinations.  Sonographer:    Darlys Gales Referring Phys: [id number] DANIEL V THOMPSON IMPRESSIONS  1. 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 Intermittently crossing tricuspid valve.  2. Left ventricular ejection fraction, by estimation, is 15%. The left ventricle has severely decreased function. The left ventricle demonstrates global hypokinesis. The left ventricular internal cavity size was moderately dilated. There is mild left ventricular hypertrophy of the infero-lateral segment. Left ventricular diastolic parameters are indeterminate.  3. Right ventricular systolic function is moderately reduced. The right ventricular size is mildly enlarged.  4. Left atrial size was mildly dilated.  5. The mitral valve is grossly normal. Mild mitral valve regurgitation. No evidence of mitral stenosis.  6. The aortic valve is grossly normal. Aortic valve regurgitation is not visualized. No aortic stenosis is present. Conclusion(s)/Recommendation(s): No left ventricular mural or apical thrombus/thrombi. Critical findings reported to Dr. Jerral Ralph and acknowledged at 07/10/23 11:05 am. FINDINGS  Left Ventricle: Left ventricular ejection fraction, by estimation, is 15%. The left ventricle has severely decreased function. The left ventricle demonstrates global hypokinesis. The left ventricular internal cavity size was moderately dilated. There is  mild left ventricular hypertrophy of the infero-lateral segment. Left  ventricular diastolic  parameters are indeterminate. Right Ventricle: The right ventricular size is mildly enlarged. No increase in right ventricular wall thickness. Right ventricular systolic function is moderately reduced. Left Atrium: Left atrial size was mildly dilated. Right Atrium: Right atrial size was normal in size. Pericardium: Trivial pericardial effusion is present. Mitral Valve: The mitral valve is grossly normal. Mild mitral valve regurgitation. No evidence of mitral valve stenosis. Tricuspid Valve: The tricuspid valve is normal in structure. Tricuspid valve regurgitation is mild . No evidence of tricuspid stenosis. Aortic Valve: The aortic valve is grossly normal. Aortic valve regurgitation is not visualized. No aortic stenosis is present. Aortic valve mean gradient measures 2.0 mmHg. Aortic valve peak gradient measures 3.2 mmHg. Aortic valve area, by VTI measures 3.40 cm. Pulmonic Valve: The pulmonic valve was normal in structure. Pulmonic valve regurgitation is trivial. No evidence of pulmonic stenosis. Aorta: The aortic root is normal in size and structure. Venous: The inferior vena cava was not well visualized. IAS/Shunts: No atrial level shunt detected by color flow Doppler.  LEFT VENTRICLE PLAX 2D LVIDd:         6.30 cm   Diastology LVIDs:         5.90 cm   LV e' medial:    6.20 cm/s LV PW:         1.10 cm   LV E/e' medial:  11.7 LV IVS:        0.80 cm   LV e' lateral:   14.00 cm/s LVOT diam:     2.10 cm   LV E/e' lateral: 5.2 LV SV:         36 LV SV Index:   15 LVOT Area:     3.46 cm  RIGHT VENTRICLE RV S prime:     5.87 cm/s TAPSE (M-mode): 0.7 cm LEFT ATRIUM             Index        RIGHT ATRIUM           Index LA Vol (A2C):   54.4 ml 23.05 ml/m  RA Area:     20.00 cm LA Vol (A4C):   94.8 ml 40.17 ml/m  RA Volume:   61.10 ml  25.89 ml/m LA Biplane Vol: 78.8 ml 33.39 ml/m  AORTIC VALVE AV Area (Vmax):    2.92 cm AV Area (Vmean):   2.76 cm AV Area (VTI):     3.40 cm AV Vmax:            89.90 cm/s AV Vmean:          70.700 cm/s AV VTI:            0.106 m AV Peak Grad:      3.2 mmHg AV Mean Grad:      2.0 mmHg LVOT Vmax:         75.80 cm/s LVOT Vmean:        56.400 cm/s LVOT VTI:          0.104 m LVOT/AV VTI ratio: 0.98  AORTA Ao Root diam: 3.20 cm MITRAL VALVE               TRICUSPID VALVE MV Area (PHT): 4.52 cm    TR Peak grad:   25.2 mmHg MV Decel Time: 168 msec    TR Vmax:        251.00 cm/s MV E velocity: 72.30 cm/s  SHUNTS                            Systemic VTI:  0.10 m                            Systemic Diam: 2.10 cm Weston Brass MD Electronically signed by Weston Brass MD Signature Date/Time: 07/10/2023/11:13:31 AM    Final    DG Chest Port 1 View Result Date: 07/09/2023 CLINICAL DATA:  [id number] Encounter for chest tube placement [id number]. EXAM: PORTABLE CHEST 1 VIEW COMPARISON:  07/07/2023. FINDINGS: Low lung volume. There are heterogeneous opacities at the right lung base, overall decreased since the prior study. Mild diffuse pulmonary vascular congestion noted. No frank pulmonary edema. Left lung is grossly clear. Left lateral costophrenic angle is clear. Blunting of right lateral costophrenic angle is likely secondary to combination of pleural effusion and lung parenchymal opacity. Interval placement of right-sided pleural drainage catheter. No pneumothorax. Stable cardio-mediastinal silhouette. No acute osseous abnormalities. The soft tissues are within normal limits. IMPRESSION: 1. Mild diffuse pulmonary vascular congestion without frank pulmonary edema. 2. Heterogeneous opacities in the right lung base overall decreased since the prior study. Findings may represent combination of consolidation and/or atelectasis. Blunting of right lateral costophrenic angle may be due to combination of pleural effusion and lung parenchymal opacity. 3. Interval placement of right-sided pleural drainage catheter. No pneumothorax. Overall, there is significant interval  decrease in the right pleural effusion. Electronically Signed   By: Jules Schick M.D.   On: 07/09/2023 16:43   DG Neck Soft Tissue Result Date: 07/09/2023 CLINICAL DATA:  Right-sided neck pain. EXAM: NECK SOFT TISSUES - 1+ VIEW COMPARISON:  None Available. FINDINGS: There is no evidence of retropharyngeal soft tissue swelling or epiglottic enlargement. The cervical airway is unremarkable and no radio-opaque foreign body identified. Mild degenerative changes of the cervical spine noted. IMPRESSION: Negative. Electronically Signed   By: Jules Schick M.D.   On: 07/09/2023 13:35   CT Angio Chest PE W and/or Wo Contrast Result Date: 07/08/2023 CLINICAL DATA:  Positive D-dimer. Shortness of breath. PE suspected. EXAM: CT ANGIOGRAPHY CHEST WITH CONTRAST TECHNIQUE: Multidetector CT imaging of the chest was performed using the standard protocol during bolus administration of intravenous contrast. Multiplanar CT image reconstructions and MIPs were obtained to evaluate the vascular anatomy. RADIATION DOSE REDUCTION: This exam was performed according to the departmental dose-optimization program which includes automated exposure control, adjustment of the mA and/or kV according to patient size and/or use of iterative reconstruction technique. CONTRAST:  75mL OMNIPAQUE IOHEXOL 350 MG/ML SOLN COMPARISON:  CTA chest 06/23/2023 FINDINGS: Cardiovascular: Cardiomegaly. No pericardial effusion. No central pulmonary embolism. There is poor opacification of the right lower and middle lobe pulmonary arteries which is favored due to reduced cardiac output and mixing artifact. The majority of contrast remains in the right heart. Right lower lobe segmental and subsegmental pulmonary emboli can not be excluded. Elevated right heart pressures with reflux of contrast into the IVC and hepatic veins. Mediastinum/Nodes: Trachea and esophagus are unremarkable. Shotty mediastinal and hilar lymph nodes are favored reactive. Lungs/Pleura:  Large right pleural effusion, increased from 06/23/2023. Bronchial wall thickening and mucous plugging in the right lower and middle lobes. Ground-glass opacities and peribronchovascular consolidation in the right lower lobe and right middle lobe. Interlobular septal thickening in the right lower and middle lobes. These findings have progressed from 06/23/2023. The left lung is  clear. No pneumothorax. Upper Abdomen: No acute abnormality. Musculoskeletal: No acute fracture. Review of the MIP images confirms the above findings. IMPRESSION: 1. No central pulmonary embolism. Poor opacification of the right lower and middle lobe pulmonary arteries is favored due to reduced cardiac output and mixing artifact. Right lower lobe segmental and subsegmental pulmonary emboli can not be excluded. 2. Right lower and middle lobe bronchopneumonia increased from 06/23/2023. 3. Large right pleural effusion, increased from 06/23/2023. 4. Cardiomegaly with elevated right heart pressures. Electronically Signed   By: Minerva Fester M.D.   On: 07/08/2023 02:36   DG Chest 2 View Result Date: 07/07/2023 CLINICAL DATA:  Shortness of breath. EXAM: CHEST - 2 VIEW COMPARISON:  June 23, 2023 FINDINGS: The cardiac silhouette is mildly enlarged and unchanged in size. Marked severity infiltrate is seen within the right lung base. This is increased in severity when compared to the prior study. Mild left basilar atelectasis and/or infiltrate is also noted. There is a small right pleural effusion. No pneumothorax is identified. The visualized skeletal structures are unremarkable. IMPRESSION: 1. Marked severity right basilar infiltrate, increased in severity when compared to the prior study. 2. Mild left basilar atelectasis and/or infiltrate. 3. Small right pleural effusion. Electronically Signed   By: Aram Candela M.D.   On: 07/07/2023 22:18    Microbiology: Results for orders placed or performed during the hospital encounter of  07/07/23  Resp panel by RT-PCR (RSV, Flu A&B, Covid) Anterior Nasal Swab     Status: None   Collection Time: 07/08/23  1:01 AM   Specimen: Anterior Nasal Swab  Result Value Ref Range Status   SARS Coronavirus 2 by RT PCR NEGATIVE NEGATIVE Final   Influenza A by PCR NEGATIVE NEGATIVE Final   Influenza B by PCR NEGATIVE NEGATIVE Final    Comment: (NOTE) The Xpert Xpress SARS-CoV-2/FLU/RSV plus assay is intended as an aid in the diagnosis of influenza from Nasopharyngeal swab specimens and should not be used as a sole basis for treatment. Nasal washings and aspirates are unacceptable for Xpert Xpress SARS-CoV-2/FLU/RSV testing.  Fact Sheet for Patients: BloggerCourse.com  Fact Sheet for Healthcare Providers: SeriousBroker.it  This test is not yet approved or cleared by the [place] FDA and has been authorized for detection and/or diagnosis of SARS-CoV-2 by FDA under an Emergency Use Authorization (EUA). This EUA will remain in effect (meaning this test can be used) for the duration of the COVID-19 declaration under Section 564(b)(1) of the Act, 21 U.S.C. section 360bbb-3(b)(1), unless the authorization is terminated or revoked.     Resp Syncytial Virus by PCR NEGATIVE NEGATIVE Final    Comment: (NOTE) Fact Sheet for Patients: BloggerCourse.com  Fact Sheet for Healthcare Providers: SeriousBroker.it  This test is not yet approved or cleared by the [place] FDA and has been authorized for detection and/or diagnosis of SARS-CoV-2 by FDA under an Emergency Use Authorization (EUA). This EUA will remain in effect (meaning this test can be used) for the duration of the COVID-19 declaration under Section 564(b)(1) of the Act, 21 U.S.C. section 360bbb-3(b)(1), unless the authorization is terminated or revoked.  Performed at [facility], [address] [place],  [place] [address]   Blood culture (routine x 2)     Status: None   Collection Time: 07/08/23  1:54 AM   Specimen: BLOOD  Result Value Ref Range Status   Specimen Description BLOOD BLOOD RIGHT HAND  Final   Special Requests   Final    BOTTLES  DRAWN AEROBIC AND ANAEROBIC Blood Culture adequate volume   Culture   Final    NO GROWTH 5 DAYS Performed at [facility], [address] [place], [place] [address]    Report Status 07/13/2023 FINAL  Final  Blood culture (routine x 2)     Status: None   Collection Time: 07/08/23  1:54 AM   Specimen: BLOOD LEFT HAND  Result Value Ref Range Status   Specimen Description BLOOD LEFT HAND  Final   Special Requests   Final    BOTTLES DRAWN AEROBIC ONLY Blood Culture adequate volume   Culture   Final    NO GROWTH 5 DAYS Performed at [facility], [address] [place], [place] [address]    Report Status 07/13/2023 FINAL  Final  Urine Culture (for pregnant, neutropenic or urologic patients or patients with an indwelling urinary catheter)     Status: None   Collection Time: 07/08/23 11:33 AM   Specimen: Urine, Clean Catch  Result Value Ref Range Status   Specimen Description URINE, CLEAN CATCH  Final   Special Requests NONE  Final   Culture   Final    NO GROWTH Performed at [facility], [address], [place], [place] [address]    Report Status 07/09/2023 FINAL  Final  Culture, body fluid w Gram Stain-bottle     Status: None   Collection Time: 07/09/23  1:54 AM   Specimen: Fluid  Result Value Ref Range Status   Specimen Description FLUID PLEURAL  Final   Special Requests BOTTLES DRAWN AEROBIC AND ANAEROBIC  Final   Culture   Final    NO GROWTH 5 DAYS Performed at [facility], [address] [place], [place] [address]    Report Status 07/14/2023 FINAL  Final  Gram stain     Status: None   Collection Time: 07/09/23  1:54 AM   Specimen: Fluid  Result Value Ref Range Status   Specimen Description FLUID PLEURAL  Final    Special Requests NONE  Final   Gram Stain   Final    WBC PRESENT,BOTH PMN AND MONONUCLEAR NO ORGANISMS SEEN CYTOSPIN SMEAR Performed at [facility], [address] [place], [place] [address]    Report Status 07/09/2023 FINAL  Final  MRSA Next Gen by PCR, Nasal     Status: None   Collection Time: 07/10/23  3:05 PM   Specimen: Nasal Mucosa; Nasal Swab  Result Value Ref Range Status   MRSA by PCR Next Gen NOT DETECTED NOT DETECTED Final    Comment: (NOTE) The GeneXpert MRSA Assay (FDA approved for NASAL specimens only), is one component of a comprehensive MRSA colonization surveillance program. It is not intended to diagnose MRSA infection nor to guide or monitor treatment for MRSA infections. Test performance is not FDA approved in patients less than 23 years old. Performed at [facility], [address] [place], [place] [address]   Expectorated Sputum Assessment w Gram Stain, Rflx to Resp Cult     Status: None   Collection Time: 07/11/23  9:58 PM   Specimen: Sputum  Result Value Ref Range Status   Specimen Description SPUTUM  Final   Special Requests NONE  Final   Sputum evaluation   Final    THIS SPECIMEN IS ACCEPTABLE FOR SPUTUM CULTURE Performed at [facility], [address] [place], [place] [address]    Report Status 07/12/2023 FINAL  Final  Culture, Respiratory w Gram Stain     Status: None   Collection Time: 07/11/23  9:58 PM   Specimen: SPU  Result Value Ref Range Status   Specimen Description SPUTUM  Final   Special Requests NONE Reflexed from [id number]  Final   Gram Stain   Final    MODERATE WBC PRESENT,BOTH PMN AND MONONUCLEAR FEW YEAST FEW GRAM POSITIVE RODS FEW GRAM NEGATIVE RODS    Culture   Final    MODERATE Normal respiratory flora-no Staph aureus or Pseudomonas seen Performed at [facility], [address], [place], [place] [address]    Report Status 07/14/2023 FINAL  Final    Labs: CBC: Recent Labs  Lab  07/16/23 0507 07/17/23 0252 07/18/23 0238 07/19/23 0225 07/20/23 0248  WBC 6.5 6.2 8.8 8.7 5.4  HGB 14.9 15.5 15.5 15.7 15.4  HCT 45.0 46.1 45.5 46.6 45.2  MCV 97.0 96.2 94.0 93.4 95.0  PLT 335 365 368 440* 396   Basic Metabolic Panel: Recent Labs  Lab 07/16/23 0507 07/17/23 0252 07/18/23 0238 07/19/23 0225 07/19/23 0941 07/20/23 0248  NA 136 136 137  --  135 136  K 4.2 4.3 4.9  --  4.3 4.2  CL 104 102 102  --  95* 99  CO2 24 24 22   --  31 24  GLUCOSE 97 104* 111*  --  97 93  BUN 15 16 22*  --  27* 31*  CREATININE 0.93 0.91 0.97  --  1.12 1.18  CALCIUM 8.9 8.8* 9.5  --  9.2 9.2  MG  --  2.0 2.1 2.3  --  2.2   Liver Function Tests: No results for input(s): "AST", "ALT", "ALKPHOS", "BILITOT", "PROT", "ALBUMIN" in the last 168 hours. CBG: Recent Labs  Lab 07/16/23 0802  GLUCAP 101*    Discharge time spent: greater than 30 minutes.  Signed: Alba Cory, MD [facility] 07/20/2023

## 2023-07-20 NOTE — Progress Notes (Addendum)
 Patient ID: Anthony Singleton, male   DOB: August 29, 1970, 53 y.o.   MRN: [id number]     Advanced Heart Failure Rounding Note  Cardiologist: None  Chief Complaint: CHF Subjective:   2/21 Patient seen in MRI. He developed nausea after getting gadolinium but did not vomit and became short of breath.  Symptoms improved with Benadryl and he was put on oxygen and given a dose of Solumedrol.    Denies SOB. Denies chest pain   Objective:   Weight Range: 92.4 kg Body mass index is 23.55 kg/m.   Vital Signs:   Temp:  [97.5 F (36.4 C)-98.2 F (36.8 C)] 97.9 F (36.6 C) (02/24 0728) Pulse Rate:  [52-107] 98 (02/24 0728) Resp:  [17-20] 18 (02/24 0728) BP: (91-110)/(66-85) 99/71 (02/24 0728) SpO2:  [94 %-98 %] 97 % (02/24 0728) Weight:  [92.4 kg] 92.4 kg (02/24 0444) Last BM Date : 07/19/23  Weight change: Filed Weights   07/18/23 0508 07/19/23 0356 07/20/23 0444  Weight: 98.1 kg 91.6 kg 92.4 kg    Intake/Output:   Intake/Output Summary (Last 24 hours) at 07/20/2023 0857 Last data filed at 07/20/2023 0824 Gross per 24 hour  Intake 1074.27 ml  Output 200 ml  Net 874.27 ml      Physical Exam    General: . No resp difficulty Neck: supple. no JVD. Carotids 2+ bilat; no bruits. No lymphadenopathy or thryomegaly appreciated. Cor: PMI nondisplaced. Regular rate & rhythm. No rubs, gallops or murmurs. Lungs: clear on room air.  Abdomen: soft, nontender, nondistended. No hepatosplenomegaly. No bruits or masses. Good bowel sounds. Extremities: no cyanosis, clubbing, rash, edema Neuro: alert & orientedx3, cranial nerves grossly intact. moves all 4 extremities w/o difficulty. Affect pleasant   Telemetry  ST 100s   Labs    CBC Recent Labs    07/19/23 0225 07/20/23 0248  WBC 8.7 5.4  HGB 15.7 15.4  HCT 46.6 45.2  MCV 93.4 95.0  PLT 440* 396   Basic Metabolic Panel Recent Labs    40/98/11 0225 07/19/23 0941 07/20/23 0248  NA  --  135 136  K  --  4.3 4.2  CL  --   95* 99  CO2  --  31 24  GLUCOSE  --  97 93  BUN  --  27* 31*  CREATININE  --  1.12 1.18  CALCIUM  --  9.2 9.2  MG 2.3  --  2.2   Liver Function Tests No results for input(s): "AST", "ALT", "ALKPHOS", "BILITOT", "PROT", "ALBUMIN" in the last 72 hours. No results for input(s): "LIPASE", "AMYLASE" in the last 72 hours. Cardiac Enzymes No results for input(s): "CKTOTAL", "CKMB", "CKMBINDEX", "TROPONINI" in the last 72 hours.  BNP: BNP (last 3 results) Recent Labs    07/07/23 2144 07/09/23 0841 07/15/23 0449  BNP 1,504.8* 1,055.2* 495.1*    ProBNP (last 3 results) No results for input(s): "PROBNP" in the last 8760 hours.   D-Dimer No results for input(s): "DDIMER" in the last 72 hours. Hemoglobin A1C No results for input(s): "HGBA1C" in the last 72 hours.  Fasting Lipid Panel No results for input(s): "CHOL", "HDL", "LDLCALC", "TRIG", "CHOLHDL", "LDLDIRECT" in the last 72 hours.  Thyroid Function Tests No results for input(s): "TSH", "T4TOTAL", "T3FREE", "THYROIDAB" in the last 72 hours.  Invalid input(s): "FREET3"  Other results:   Imaging    No results found.    Medications:     Scheduled Medications:  apixaban  5 mg Oral BID   dapagliflozin propanediol  10 mg Oral Daily   digoxin  0.125 mg Oral Daily   feeding supplement  237 mL Oral TID BM   folic acid  1 mg Oral Daily   lidocaine  1 patch Transdermal Q24H   multivitamin with minerals  1 tablet Oral Daily   polyethylene glycol  17 g Oral Daily   rosuvastatin  10 mg Oral Daily   sacubitril-valsartan  1 tablet Oral BID   senna-docusate  2 tablet Oral QHS   sodium chloride flush  10 mL Intrapleural Q8H   sodium chloride flush  3 mL Intravenous Q12H   spironolactone  12.5 mg Oral Daily   thiamine  100 mg Oral Daily    Infusions:  famotidine (PEPCID) IV Stopped (07/19/23 2234)   piperacillin-tazobactam (ZOSYN)  IV 3.375 g (07/20/23 0559)    PRN Medications: acetaminophen, cyclobenzaprine,  diphenhydrAMINE, haloperidol lactate, HYDROcodone bit-homatropine, HYDROmorphone (DILAUDID) injection, melatonin, ondansetron (ZOFRAN) IV, ondansetron (ZOFRAN) IV, mouth rinse, oxyCODONE, sodium chloride flush   Assessment/Plan   1. Acute systolic CHF: Nonischemic cardiomyopathy.  Echo this admission with  EF 15% with global hypokinesis and moderate LV dilation; RV function moderately reduced; mild MR.  Patient has no family history of cardiomyopathy, he does report family members with CAD though not premature.  Patient has a history of methamphetamine abuse which can cause a dilated cardiomyopathy though he says he has quit x 5-6 months. Coronary angiography showed no significant CAD.  RHC was not done due to recent venous thromboembolism.  He does not look markedly volume overloaded on exam but became short of breath in MRI after developing nausea post-gadolinium.   - Appear euvolemic. Overall diuresed 32 pounds.  - No bb for now.  - Continue digoxin 0.125 daily - Farxiga 10 mg daily.  - Increase spiro to 25 mg daily.  - Continue Entresto 24/26 bid.  - Renal function stable. 2. 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  CTA chest showed PE on left. Patient does have a history of RIJ CVL at prior hospitalization at [facility] it appears.   Concerned about hypercoagulable process - Continue apixaban.  -  In process--->Factor V Leiden, prothrombin gene mutation, and antiphospholipid workup.  3. R-sided PNA: He remains on Zosyn per primary team.  4. Polysubstance abuse: Marijuana and methamphetamine.  He says he has been off meth for about 5 months.   We will set up HF follow up.   HF meds for d/c  -Lasix  20 mg daily as needed for 3 pounds weight gain in 24 hours.  -Digoxin 0.125 daily - Farxiga 10 mg daily.  Cleda Daub to 25 mg daily.  - Continue Entresto 24/26 bid -Eliquis 5 mg twice a day   Length of  Stay: 12  Amy Clegg, NP  07/20/2023, 8:57 AM  Advanced Heart Failure Team Pager [phone] (M-F; 7a - 5p)  Please contact [facility] for night-coverage after hours (5p -7a ) and weekends on [email or web address]  Patient seen and examined with the above-signed Advanced Practice Provider and/or Housestaff. I personally reviewed laboratory data, imaging studies and relevant notes. I independently examined the patient and formulated the important aspects of the plan. I have edited the note to reflect any of my changes or salient points. I have personally discussed the plan with the patient and/or family.  cMRI reviewed EF 12% minimal non-coronary LGE.   Feels good. Denies CP or SOB.   Volume status ok. Pulse  pressure remains narrow   General:  Sitting up in bed. No resp difficulty HEENT: normal Neck: supple. no JVD. Carotids 2+ bilat; no bruits. No lymphadenopathy or thryomegaly appreciated. Cor: PMI laterally displaced. Regular rate & rhythm. No rubs, gallops or murmurs. Lungs: clear Abdomen: soft, nontender, nondistended. No hepatosplenomegaly. No bruits or masses. Good bowel sounds. Extremities: no cyanosis, clubbing, rash, edema Neuro: alert & orientedx3, cranial nerves grossly intact. moves all 4 extremities w/o difficulty. Affect pleasant\  MRI results reviewed with him.   Much improved despite significant biventricular dysfunction.   Continue Eliquis for PE.   He is on good GDMT. (Discussed with PharmD personally.) Ok for d/c today with very close f/u in HF Clinic.   No work for now   Arvilla Meres, MD  2:23 PM

## 2023-07-20 NOTE — Progress Notes (Signed)
 Mobility Specialist Progress Note:   07/20/23 0931  Mobility  Activity Ambulated independently in hallway  Level of Assistance Modified independent, requires aide device or extra time  Assistive Device Other (Comment) (IV Pole)  Distance Ambulated (ft) 700 ft  Activity Response Tolerated well  Mobility Referral Yes  Mobility visit 1 Mobility  Mobility Specialist Start Time (ACUTE ONLY) 0931  Mobility Specialist Stop Time (ACUTE ONLY) 0945  Mobility Specialist Time Calculation (min) (ACUTE ONLY) 14 min   Pt agreeable to mobility session, required no physical assistance. HR 120bpm at peak. Back in bed with all needs met.   Addison Lank Mobility Specialist Please contact via SecureChat or  Rehab office at [phone]

## 2023-07-20 NOTE — Progress Notes (Signed)
 Pt has orders to be discharged. Discharge instructions given and pt has no additional questions at this time. Medication regimen reviewed and pt educated. Pt verbalized understanding and has no additional questions. Telemetry box removed. IV removed and site in good condition.

## 2023-07-20 NOTE — Plan of Care (Signed)
  Problem: Activity: Goal: Risk for activity intolerance will decrease Outcome: Progressing   Problem: Nutrition: Goal: Adequate nutrition will be maintained Outcome: Progressing   Problem: Elimination: Goal: Will not experience complications related to bowel motility Outcome: Progressing Goal: Will not experience complications related to urinary retention Outcome: Progressing   Problem: Safety: Goal: Ability to remain free from injury will improve Outcome: Progressing   Problem: Activity: Goal: Ability to tolerate increased activity will improve Outcome: Progressing   Problem: Respiratory: Goal: Ability to maintain adequate ventilation will improve Outcome: Progressing Goal: Ability to maintain a clear airway will improve Outcome: Progressing   Problem: Activity: Goal: Capacity to carry out activities will improve Outcome: Progressing   Problem: Activity: Goal: Ability to tolerate increased activity will improve Outcome: Progressing

## 2023-07-20 NOTE — Discharge Instructions (Signed)

## 2023-07-20 NOTE — Evaluation (Addendum)
 Physical Therapy Re-Evaluation & Discharge Patient Details Name: Anthony Singleton MRN: [id number] DOB: December 13, 1970 Today's Date: 07/20/2023  History of Present Illness  Anthony Singleton is a 72 y old male  presented to ED for worsening shortness of breath on 07/07/23 admitted to ICU. A CTA chest 07/08/23 showed -Right lower lobe segmental and subsegmental pulmonary emboli can not be excluded. Right lower and middle lobe bronchopneumonia increased from 06/23/2023. Large right pleural effusion, increased from 06/23/2023. Cardiomegaly with elevated right heart pressures. During ICU stay he was treated for PE, found to have large mobile RA thrombus with EF 15%, right internal jugular/ axillary vein thrombus. Patient got right chest tube placed. Chest tube now removed. Also admitted for acute systolic and diastolic heart failure. S/p cardiac cath 2/20. Past medical history significant for traumatic brain injury, poly substance abuse, anxiety disorder.   Clinical Impression  Received an additional PT Consult order on 2/23 after pt was evaluated and discharged from PT services on 2/19. Checked in on pt and pt reports he has been ambulating the halls independently while here since his PT Eval on 2/19. However, nursing got a report that he potentially became weak last night, but pt reports he was lethargic after receiving pain meds last night. He was able to demonstrate continued independence with functional mobility this date. Educated pt and provided pt with CHF booklet and handouts on exercising with CHF. He verbalized understanding, however after discussing managing his CHF he then stated to the MD no one told him he had heart failure and that was the first he heard of it even though specifically mentioned it minutes earlier with therapist. I question whether there are some memory deficits at baseline, potentially due to his hx of TBI. All education completed and questions answered. Pt is independent with  mobility and not currently needing acute PT or post-acute PT services. Will sign off. Thank you for this referral.       If plan is discharge home, recommend the following:     Can travel by private vehicle        Equipment Recommendations None recommended by PT  Recommendations for Other Services       Functional Status Assessment Patient has not had a recent decline in their functional status     Precautions / Restrictions Precautions Precautions: None Restrictions Weight Bearing Restrictions Per Provider Order: No      Mobility  Bed Mobility Overal bed mobility: Modified Independent             General bed mobility comments: HOB elevated, no assistance needed    Transfers Overall transfer level: Independent Equipment used: None               General transfer comment: No LOB, no assistance needed    Ambulation/Gait Ambulation/Gait assistance: Independent Gait Distance (Feet): 20 Feet Assistive device: None Gait Pattern/deviations: WFL(Within Functional Limits) Gait velocity: WFL     General Gait Details: No LOB noted. No significant gait deviations noted. Pt reports he has been ambulating the halls independently while here.  Stairs            Wheelchair Mobility     Tilt Bed    Modified Rankin (Stroke Patients Only)       Balance Overall balance assessment: No apparent balance deficits (not formally assessed)  Pertinent Vitals/Pain Pain Assessment Pain Assessment: Faces Faces Pain Scale: No hurt Pain Intervention(s): Monitored during session    Home Living Family/patient expects to be discharged to:: Private residence Living Arrangements: Alone Available Help at Discharge: Family;Available PRN/intermittently Type of Home: Mobile home Home Access: Stairs to enter Entrance Stairs-Rails: Can reach both Entrance Stairs-Number of Steps: 4   Home Layout: One  level Home Equipment: Hand held shower head      Prior Function Prior Level of Function : Independent/Modified Independent;Working/employed;Driving             Mobility Comments: Ind ADLs Comments: Ind; works Engineering geologist Extremity Assessment Upper Extremity Assessment: Overall WFL for tasks assessed    Lower Extremity Assessment Lower Extremity Assessment: Overall WFL for tasks assessed    Cervical / Trunk Assessment Cervical / Trunk Assessment: Normal  Communication   Communication Communication: No apparent difficulties    Cognition Arousal: Alert Behavior During Therapy: WFL for tasks assessed/performed   PT - Cognitive impairments: No apparent impairments                       PT - Cognition Comments: after discussing managing his CHF he then stated to the MD no one told him he had heart failure and that was the first he heard of it even though specifically mentioned it minutes earlier with therapist, questionable memory but unsure if due to hx of TBI? Following commands: Intact       Cueing       General Comments General comments (skin integrity, edema, etc.): educated pt and provided pt with CHF booklet and handouts on exercising with CHF; educated pt on weighing himself daily, reducing sodium/processed foods intake (pt stating he is going to continue to eat out at lunch though despite education, he did report he may try to find healthier options eating out though), and eating fresh/frozen fruits/veggies and rinsing canned foods; he verbalized understanding, however after discussing managing his CHF he then stated to the MD no one told him he had heart failure and that was the first he heard of it even though specifically mentioned it minutes earlier with therapist    Exercises     Assessment/Plan    PT Assessment Patient does not need any further PT services  PT Problem List         PT Treatment  Interventions      PT Goals (Current goals can be found in the Care Plan section)  Acute Rehab PT Goals Patient Stated Goal: to go back to work and care for his kids PT Goal Formulation: All assessment and education complete, DC therapy Time For Goal Achievement: 07/21/23 Potential to Achieve Goals: Good    Frequency       Co-evaluation               AM-PAC PT "6 Clicks" Mobility  Outcome Measure Help needed turning from your back to your side while in a flat bed without using bedrails?: None Help needed moving from lying on your back to sitting on the side of a flat bed without using bedrails?: None Help needed moving to and from a bed to a chair (including a wheelchair)?: None Help needed standing up from a chair using your arms (e.g., wheelchair or bedside chair)?: None Help needed to walk in hospital room?: None Help needed climbing 3-5 steps with a railing? : A Little 6 Click Score: 23  End of Session   Activity Tolerance: Patient tolerated treatment well Patient left: in bed;with call bell/phone within reach;with nursing/sitter in room Nurse Communication: Mobility status PT Visit Diagnosis: Other (comment) (cardiac deficits)    Time: 7829-5621 PT Time Calculation (min) (ACUTE ONLY): 20 min   Charges:   PT Evaluation $PT Re-evaluation: 1 Re-eval   PT General Charges $$ ACUTE PT VISIT: 1 Visit         Virgil Benedict, PT, DPT Acute Rehabilitation Services  Office: [phone]   Bettina Gavia 07/20/2023, 9:11 AM

## 2023-07-20 NOTE — Progress Notes (Signed)
 OT Screen Note  Patient Details Name: Anthony Singleton MRN: [id number] DOB: [birth year]   Cancelled Treatment:    Reason Eval/Treat Not Completed: OT screened, no needs identified, will sign off ( independent and CHF book reviewed by PT )  [name] 07/20/2023, 1:14 PM

## 2023-07-20 NOTE — TOC Transition Note (Signed)
 Transition of Care [facility]) - Discharge Note   Patient Details  Name: Anthony Singleton MRN: [id number] Date of Birth: 1970/08/25  Transition of Care [facility]) CM/SW Contact:  Nicanor Bake Phone Number: [phone] 07/20/2023, 12:13 PM   Clinical Narrative: HF CSW called and scheduled pts Hospital follow up appointment for Wednesday, July 22, 2023 at 10:30 AM.   12:11 PM- HF CSW spoke with pt over the phone and notified him of upcoming appts. Pt stated that his brother or friend would be picking him up to transport him home.       Barriers to Discharge: Continued Medical Work up   Patient Goals and CMS Choice Patient states their goals for this hospitalization and ongoing recovery are:: to get better and see kids          Discharge Placement                       Discharge Plan and Services Additional resources added to the After Visit Summary for                                       Social Drivers of Health (SDOH) Interventions SDOH Screenings   Food Insecurity: No Food Insecurity (07/11/2023)  Housing: Low Risk  (07/11/2023)  Transportation Needs: No Transportation Needs (07/11/2023)  Utilities: Not At Risk (07/11/2023)  Depression (PHQ2-9): Medium Risk (01/19/2019)  Tobacco Use: Low Risk  (07/07/2023)     Readmission Risk Interventions     No data to display

## 2023-07-20 NOTE — Telephone Encounter (Signed)
 Patient will need hospital follow up for pulmonary emboli and DVT in 4-6 weeks. Please place on my schedule in a blocked slot if needed.  Thanks, JD

## 2023-07-21 LAB — FACTOR 5 LEIDEN

## 2023-07-22 ENCOUNTER — Ambulatory Visit (HOSPITAL_BASED_OUTPATIENT_CLINIC_OR_DEPARTMENT_OTHER): Payer: BC Managed Care – PPO | Admitting: Student

## 2023-07-22 LAB — PROTHROMBIN GENE MUTATION

## 2023-07-29 ENCOUNTER — Ambulatory Visit (HOSPITAL_BASED_OUTPATIENT_CLINIC_OR_DEPARTMENT_OTHER): Payer: BC Managed Care – PPO | Admitting: Student
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
## 2023-07-30 ENCOUNTER — Encounter (HOSPITAL_COMMUNITY): Payer: Self-pay

## 2023-07-30 ENCOUNTER — Ambulatory Visit (HOSPITAL_COMMUNITY)
Admit: 2023-07-30 | Discharge: 2023-07-30 | Disposition: A | Discharge status: Home / Self Care | Payer: BC Managed Care – PPO | Source: Ambulatory Visit | Attending: Adult Health | Admitting: Adult Health

## 2023-07-30 VITALS — BP 100/60 | HR 121 | Wt 220.0 lb

## 2023-07-30 DIAGNOSIS — D6851 Activated protein C resistance: Secondary | ICD-10-CM | POA: Diagnosis not present

## 2023-07-30 DIAGNOSIS — Z7984 Long term (current) use of oral hypoglycemic drugs: Secondary | ICD-10-CM | POA: Insufficient documentation

## 2023-07-30 DIAGNOSIS — D6859 Other primary thrombophilia: Secondary | ICD-10-CM | POA: Diagnosis not present

## 2023-07-30 DIAGNOSIS — I82B11 Acute embolism and thrombosis of right subclavian vein: Secondary | ICD-10-CM | POA: Insufficient documentation

## 2023-07-30 DIAGNOSIS — E785 Hyperlipidemia, unspecified: Secondary | ICD-10-CM | POA: Insufficient documentation

## 2023-07-30 DIAGNOSIS — I82C11 Acute embolism and thrombosis of right internal jugular vein: Secondary | ICD-10-CM | POA: Insufficient documentation

## 2023-07-30 DIAGNOSIS — R5383 Other fatigue: Secondary | ICD-10-CM | POA: Insufficient documentation

## 2023-07-30 DIAGNOSIS — R0601 Orthopnea: Secondary | ICD-10-CM | POA: Diagnosis not present

## 2023-07-30 DIAGNOSIS — F1911 Other psychoactive substance abuse, in remission: Secondary | ICD-10-CM

## 2023-07-30 DIAGNOSIS — Z79899 Other long term (current) drug therapy: Secondary | ICD-10-CM | POA: Insufficient documentation

## 2023-07-30 DIAGNOSIS — I428 Other cardiomyopathies: Secondary | ICD-10-CM | POA: Diagnosis not present

## 2023-07-30 DIAGNOSIS — Z7901 Long term (current) use of anticoagulants: Secondary | ICD-10-CM | POA: Diagnosis not present

## 2023-07-30 DIAGNOSIS — I5082 Biventricular heart failure: Secondary | ICD-10-CM | POA: Diagnosis not present

## 2023-07-30 DIAGNOSIS — E7841 Elevated Lipoprotein(a): Secondary | ICD-10-CM

## 2023-07-30 DIAGNOSIS — I5021 Acute systolic (congestive) heart failure: Secondary | ICD-10-CM | POA: Insufficient documentation

## 2023-07-30 DIAGNOSIS — I8290 Acute embolism and thrombosis of unspecified vein: Secondary | ICD-10-CM

## 2023-07-30 DIAGNOSIS — R000 Tachycardia, unspecified: Secondary | ICD-10-CM | POA: Diagnosis not present

## 2023-07-30 LAB — BASIC METABOLIC PANEL
Anion gap: 8 (ref 5–15)
BUN: 18 mg/dL (ref 6–20)
CO2: 23 mmol/L (ref 22–32)
Calcium: 9.1 mg/dL (ref 8.9–10.3)
Chloride: 107 mmol/L (ref 98–111)
Creatinine, Ser: 0.91 mg/dL (ref 0.61–1.24)
GFR, Estimated: >60 mL/min (ref >60)
Glucose, Bld: 104 mg/dL — ABNORMAL HIGH (ref 70–99)
Potassium: 4.8 mmol/L (ref 3.5–5.1)
Sodium: 138 mmol/L (ref 135–145)

## 2023-07-30 LAB — DIGOXIN LEVEL: Digoxin Level: 0.3 ng/mL — ABNORMAL LOW (ref 0.8–2.0)

## 2023-07-30 LAB — BRAIN NATRIURETIC PEPTIDE: B Natriuretic Peptide: 156.6 pg/mL — ABNORMAL HIGH (ref 0.0–100.0)

## 2023-07-30 MED ORDER — FOLIC ACID 1 MG PO TABS: 1.0000 mg | ORAL_TABLET | Freq: Every day | ORAL | 0 refills | Status: DC

## 2023-07-30 MED ORDER — THIAMINE HCL 100 MG PO TABS: 100.0000 mg | ORAL_TABLET | Freq: Every day | ORAL | 0 refills | Status: DC

## 2023-07-30 MED ORDER — ROSUVASTATIN CALCIUM 10 MG PO TABS: 10.0000 mg | ORAL_TABLET | Freq: Every day | ORAL | 0 refills | Status: DC

## 2023-07-30 MED ORDER — DIGOXIN 125 MCG PO TABS: 0.1250 mg | ORAL_TABLET | Freq: Every day | ORAL | 0 refills | Status: DC

## 2023-07-30 NOTE — Progress Notes (Signed)
 ReDS Vest / Clip - 07/30/23 1422       ReDS Vest / Clip   Station Marker C    Ruler Value 30    ReDS Value Range Low volume    ReDS Actual Value 29

## 2023-07-30 NOTE — Patient Instructions (Signed)
 Great to see you today!!!  Medication Changes:  None, continue current medications, refills have been sent in  Lab Work:  Labs done today, your results will be available in MyChart, we will contact you for abnormal readings.  Testing/Procedures:  Your physician has requested that you have an echocardiogram. Echocardiography is a painless test that uses sound waves to create images of your heart. It provides your doctor with information about the size and shape of your heart and how well your heart's chambers and valves are working. This procedure takes approximately one hour. There are no restrictions for this procedure. Please do NOT wear cologne, perfume, aftershave, or lotions (deodorant is allowed). Please arrive 15 minutes prior to your appointment time. IN 3 MONTHS WITH FOLLOW UP  Please note: We ask at that you not bring children with you during ultrasound (echo/ vascular) testing. Due to room size and safety concerns, children are not allowed in the ultrasound rooms during exams. Our front office staff cannot provide observation of children in our lobby area while testing is being conducted. An adult accompanying a patient to their appointment will only be allowed in the ultrasound room at the discretion of the ultrasound technician under special circumstances. We apologize for any inconvenience.  Referrals:  You have been referred to Hematology which is located inside the [facility] in [place], they will call you for an appointment  Special Instructions // Education:  Do the following things EVERYDAY: Weigh yourself in the morning before breakfast. Write it down and keep it in a log. Take your medicines as prescribed Eat low salt foods--Limit salt (sodium) to 2000 mg per day.  Stay as active as you can everyday Limit all fluids for the day to less than 2 liters   Follow-Up in: 3-4 weeks and again in 3 months with an echocardiogram   At the [facility], you and your health needs are our priority. We have a designated team specialized in the treatment of Heart Failure. This Care Team includes your primary Heart Failure Specialized Cardiologist (physician), Advanced Practice Providers (APPs- Physician Assistants and Nurse Practitioners), and Pharmacist who all work together to provide you with the care you need, when you need it.   You may see any of the following providers on your designated Care Team at your next follow up:  Dr. Arvilla Meres Dr. Marca Ancona Dr. Dorthula Nettles Dr. Theresia Bough Tonye Becket, NP Robbie Lis, [place] [facility] Jordan, [place] Brynda Peon, NP [place] Lee, NP Karle Plumber, PharmD   Please be sure to bring in all your medications bottles to every appointment.   Need to Contact us:  If you have any questions or concerns before your next appointment please send [place] a message through [place] or call our office at [phone].    TO LEAVE A MESSAGE FOR THE NURSE SELECT OPTION 2, PLEASE LEAVE A MESSAGE INCLUDING: YOUR NAME DATE OF BIRTH CALL BACK NUMBER REASON FOR CALL**this is important as we prioritize the call backs  YOU WILL RECEIVE A CALL BACK THE SAME DAY AS LONG AS YOU CALL BEFORE 4:00 PM
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
## 2023-08-01 DIAGNOSIS — Z79899 Other long term (current) drug therapy: Secondary | ICD-10-CM | POA: Diagnosis not present

## 2023-08-01 DIAGNOSIS — G8384 Todd's paralysis (postepileptic): Secondary | ICD-10-CM | POA: Diagnosis not present

## 2023-08-01 DIAGNOSIS — R29818 Other symptoms and signs involving the nervous system: Secondary | ICD-10-CM | POA: Diagnosis not present

## 2023-08-01 DIAGNOSIS — R404 Transient alteration of awareness: Secondary | ICD-10-CM | POA: Diagnosis not present

## 2023-08-01 DIAGNOSIS — R569 Unspecified convulsions: Secondary | ICD-10-CM | POA: Diagnosis not present

## 2023-08-01 DIAGNOSIS — Z8701 Personal history of pneumonia (recurrent): Secondary | ICD-10-CM | POA: Diagnosis not present

## 2023-08-01 DIAGNOSIS — R0603 Acute respiratory distress: Secondary | ICD-10-CM | POA: Diagnosis not present

## 2023-08-01 DIAGNOSIS — F419 Anxiety disorder, unspecified: Secondary | ICD-10-CM | POA: Diagnosis not present

## 2023-08-01 DIAGNOSIS — R000 Tachycardia, unspecified: Secondary | ICD-10-CM | POA: Diagnosis not present

## 2023-08-01 DIAGNOSIS — I509 Heart failure, unspecified: Secondary | ICD-10-CM | POA: Diagnosis not present

## 2023-08-10 ENCOUNTER — Ambulatory Visit (HOSPITAL_BASED_OUTPATIENT_CLINIC_OR_DEPARTMENT_OTHER): Admitting: Student

## 2023-08-19 ENCOUNTER — Inpatient Hospital Stay: Payer: BC Managed Care – PPO | Admitting: Pulmonary Disease

## 2023-08-19 NOTE — Progress Notes (Deleted)
 Synopsis: Referred in *** for ***  Subjective:   PATIENT ID: Anthony Singleton GENDER: male DOB: May 18, 1971, MRN: [id number]   HPI  No chief complaint on file.   ***  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   Tinnitus, left      Family History  Problem Relation Age of Onset   Stroke Mother    Stroke Father      Social History   Socioeconomic History   Marital status: Married    Spouse name: Not on file   Number of children: Not on file   Years of education: Not on file   Highest education level: Not on file  Occupational History   Not on file  Tobacco Use   Smoking status: Never   Smokeless tobacco: Never  Vaping Use   Vaping status: Never Used  Substance and Sexual Activity   Alcohol use: No   Drug use: Yes    Types: Amphetamines, Marijuana    Comment: "crack" pipe found on person, + for amphetamines, THC   Sexual activity: Yes  Other Topics Concern   Not on file  Social History Narrative   Not on file   Social Drivers of Health   Financial Resource Strain: Not on file  Food Insecurity: No Food Insecurity (07/11/2023)   Hunger Vital Sign    Worried About Running Out of Food in the Last Year: Never true    Ran Out of Food in the Last Year: Never true  Transportation Needs: No Transportation Needs (07/11/2023)   PRAPARE - Administrator, Civil Service (Medical): No    Lack of Transportation (Non-Medical): No  Physical Activity: Not on file  Stress: Not on file  Social Connections: Not on file  Intimate Partner Violence: Not At Risk (07/11/2023)   Humiliation, Afraid, Rape, and Kick questionnaire    Fear of Current or Ex-Partner: No    Emotionally Abused: No    Physically Abused: No    Sexually Abused: No     No Known Allergies   Outpatient Medications Prior to Visit  Medication Sig Dispense  Refill   acetaminophen (TYLENOL) 500 MG tablet Take 1,000 mg by mouth 2 (two) times daily as needed for moderate pain (pain score 4-6), headache or fever.     apixaban (ELIQUIS) 5 MG TABS tablet Take 1 tablet (5 mg total) by mouth 2 (two) times daily. 60 tablet 3   dapagliflozin propanediol (FARXIGA) 10 MG TABS tablet Take 1 tablet (10 mg total) by mouth daily. 30 tablet 3   digoxin (LANOXIN) 0.125 MG tablet Take 1 tablet (0.125 mg total) by mouth daily. 30 tablet 0   folic acid (FOLVITE) 1 MG tablet Take 1 tablet (1 mg total) by mouth daily. 30 tablet 0   furosemide (LASIX) 20 MG tablet Take 1 tablet (20 mg total) by mouth daily as needed. For increased weight of 3 pounds in 24 hours 30 tablet 11   hydrOXYzine (ATARAX) 10 MG tablet Take 1 tablet (10 mg total) by mouth 3 (three) times daily as needed. 30 tablet 0   Multiple Vitamin (MULTIVITAMIN WITH MINERALS) TABS tablet Take 1 tablet by mouth daily. 30 tablet 0   polyethylene glycol powder (GLYCOLAX/MIRALAX) 17 GM/SCOOP powder Take 1 capful (17 g) and mix with 8 oz of  water. Take mixture by mouth once daily. 238 g 0   rosuvastatin (CRESTOR) 10 MG tablet Take 1 tablet (10 mg total) by mouth daily. 30 tablet 0   sacubitril-valsartan (ENTRESTO) 24-26 MG Take 1 tablet by mouth 2 (two) times daily. 60 tablet 3   spironolactone (ALDACTONE) 25 MG tablet Take 1 tablet (25 mg total) by mouth daily. 30 tablet 2   thiamine (VITAMIN B1) 100 MG tablet Take 1 tablet (100 mg total) by mouth daily. 30 tablet 0   No facility-administered medications prior to visit.    ROS    Objective:  There were no vitals filed for this visit.   Physical Exam    CBC    Component Value Date/Time   WBC 5.4 07/20/2023 0248   RBC 4.76 07/20/2023 0248   HGB 15.4 07/20/2023 0248   HCT 45.2 07/20/2023 0248   PLT 396 07/20/2023 0248   MCV 95.0 07/20/2023 0248   MCH 32.4 07/20/2023 0248   MCHC 34.1 07/20/2023 0248   RDW 13.4 07/20/2023 0248   LYMPHSABS 0.9  07/10/2023 0525   MONOABS 0.5 07/10/2023 0525   EOSABS 0.0 07/10/2023 0525   BASOSABS 0.0 07/10/2023 0525     Chest imaging:  PFT:     No data to display          Labs:  Path:  Echo:  Heart Catheterization:       Assessment & Plan:   No diagnosis found.  Discussion: ***    Current Outpatient Medications:    acetaminophen (TYLENOL) 500 MG tablet, Take 1,000 mg by mouth 2 (two) times daily as needed for moderate pain (pain score 4-6), headache or fever., Disp: , Rfl:    apixaban (ELIQUIS) 5 MG TABS tablet, Take 1 tablet (5 mg total) by mouth 2 (two) times daily., Disp: 60 tablet, Rfl: 3   dapagliflozin propanediol (FARXIGA) 10 MG TABS tablet, Take 1 tablet (10 mg total) by mouth daily., Disp: 30 tablet, Rfl: 3   digoxin (LANOXIN) 0.125 MG tablet, Take 1 tablet (0.125 mg total) by mouth daily., Disp: 30 tablet, Rfl: 0   folic acid (FOLVITE) 1 MG tablet, Take 1 tablet (1 mg total) by mouth daily., Disp: 30 tablet, Rfl: 0   furosemide (LASIX) 20 MG tablet, Take 1 tablet (20 mg total) by mouth daily as needed. For increased weight of 3 pounds in 24 hours, Disp: 30 tablet, Rfl: 11   hydrOXYzine (ATARAX) 10 MG tablet, Take 1 tablet (10 mg total) by mouth 3 (three) times daily as needed., Disp: 30 tablet, Rfl: 0   Multiple Vitamin (MULTIVITAMIN WITH MINERALS) TABS tablet, Take 1 tablet by mouth daily., Disp: 30 tablet, Rfl: 0   polyethylene glycol powder (GLYCOLAX/MIRALAX) 17 GM/SCOOP powder, Take 1 capful (17 g) and mix with 8 oz of water. Take mixture by mouth once daily., Disp: 238 g, Rfl: 0   rosuvastatin (CRESTOR) 10 MG tablet, Take 1 tablet (10 mg total) by mouth daily., Disp: 30 tablet, Rfl: 0   sacubitril-valsartan (ENTRESTO) 24-26 MG, Take 1 tablet by mouth 2 (two) times daily., Disp: 60 tablet, Rfl: 3   spironolactone (ALDACTONE) 25 MG tablet, Take 1 tablet (25 mg total) by mouth daily., Disp: 30 tablet, Rfl: 2   thiamine (VITAMIN B1) 100 MG tablet, Take 1 tablet  (100 mg total) by mouth daily., Disp: 30 tablet, Rfl: 0

## 2023-08-26 ENCOUNTER — Telehealth (HOSPITAL_COMMUNITY): Payer: Self-pay

## 2023-08-26 NOTE — Telephone Encounter (Signed)
 Called to confirm/remind patient of their appointment at the [facility] on 08/27/23***.   Appointment:   [x] Confirmed  [] Left mess   [] No answer/No voice mail  [] Phone not in service  Patient reminded to bring all medications and/or complete list.  Confirmed patient has transportation. Gave directions, instructed to utilize valet parking.

## 2023-08-27 ENCOUNTER — Encounter (HOSPITAL_COMMUNITY)

## 2023-09-09 ENCOUNTER — Other Ambulatory Visit (HOSPITAL_COMMUNITY): Payer: Self-pay

## 2023-09-12 ENCOUNTER — Other Ambulatory Visit (HOSPITAL_COMMUNITY): Payer: Self-pay

## 2023-09-21 ENCOUNTER — Telehealth: Payer: Self-pay | Admitting: *Deleted

## 2023-09-21 NOTE — Telephone Encounter (Signed)
 Pharmacy called regarding Rx for Apixaban .  RNCM reviewed chart to find Rx written in February 2025 with 3 refills.  Seylah Wernert J. Rachel Budds, RN, BSN, NCM  Transitions of Care  Nurse Case Manager  [facility]  Operative Services  [phone]

## 2024-01-31 DIAGNOSIS — Z8673 Personal history of transient ischemic attack (TIA), and cerebral infarction without residual deficits: Secondary | ICD-10-CM | POA: Diagnosis not present

## 2024-01-31 DIAGNOSIS — F419 Anxiety disorder, unspecified: Secondary | ICD-10-CM | POA: Diagnosis not present

## 2024-01-31 DIAGNOSIS — R000 Tachycardia, unspecified: Secondary | ICD-10-CM | POA: Diagnosis not present

## 2024-01-31 DIAGNOSIS — Z79899 Other long term (current) drug therapy: Secondary | ICD-10-CM | POA: Diagnosis not present

## 2024-01-31 DIAGNOSIS — Z91199 Patient's noncompliance with other medical treatment and regimen due to unspecified reason: Secondary | ICD-10-CM | POA: Diagnosis not present

## 2024-01-31 DIAGNOSIS — R7989 Other specified abnormal findings of blood chemistry: Secondary | ICD-10-CM | POA: Diagnosis not present

## 2024-01-31 DIAGNOSIS — R042 Hemoptysis: Secondary | ICD-10-CM | POA: Diagnosis not present

## 2024-01-31 DIAGNOSIS — Z8701 Personal history of pneumonia (recurrent): Secondary | ICD-10-CM | POA: Diagnosis not present

## 2024-01-31 DIAGNOSIS — R0609 Other forms of dyspnea: Secondary | ICD-10-CM | POA: Diagnosis not present

## 2024-01-31 DIAGNOSIS — Z86711 Personal history of pulmonary embolism: Secondary | ICD-10-CM | POA: Diagnosis not present

## 2024-01-31 DIAGNOSIS — I5022 Chronic systolic (congestive) heart failure: Secondary | ICD-10-CM | POA: Diagnosis not present

## 2024-01-31 DIAGNOSIS — R079 Chest pain, unspecified: Secondary | ICD-10-CM | POA: Diagnosis not present

## 2024-01-31 DIAGNOSIS — Z7901 Long term (current) use of anticoagulants: Secondary | ICD-10-CM | POA: Diagnosis not present

## 2024-01-31 DIAGNOSIS — Z91041 Radiographic dye allergy status: Secondary | ICD-10-CM | POA: Diagnosis not present

## 2024-01-31 DIAGNOSIS — I252 Old myocardial infarction: Secondary | ICD-10-CM | POA: Diagnosis not present

## 2024-02-01 DIAGNOSIS — R000 Tachycardia, unspecified: Secondary | ICD-10-CM | POA: Diagnosis not present

## 2024-02-08 ENCOUNTER — Other Ambulatory Visit: Payer: Self-pay

## 2024-02-08 ENCOUNTER — Emergency Department (HOSPITAL_COMMUNITY)

## 2024-02-08 ENCOUNTER — Inpatient Hospital Stay (HOSPITAL_COMMUNITY)
Admission: EM | Admit: 2024-02-08 | Discharge: 2024-02-15 | DRG: 175 | Disposition: A | Discharge status: Home / Self Care | Attending: Family Medicine | Admitting: Family Medicine

## 2024-02-08 DIAGNOSIS — I428 Other cardiomyopathies: Secondary | ICD-10-CM | POA: Diagnosis not present

## 2024-02-08 DIAGNOSIS — J9811 Atelectasis: Secondary | ICD-10-CM | POA: Diagnosis present

## 2024-02-08 DIAGNOSIS — R0489 Hemorrhage from other sites in respiratory passages: Secondary | ICD-10-CM | POA: Diagnosis not present

## 2024-02-08 DIAGNOSIS — R079 Chest pain, unspecified: Secondary | ICD-10-CM | POA: Diagnosis not present

## 2024-02-08 DIAGNOSIS — D6851 Activated protein C resistance: Secondary | ICD-10-CM | POA: Diagnosis present

## 2024-02-08 DIAGNOSIS — Z91199 Patient's noncompliance with other medical treatment and regimen due to unspecified reason: Secondary | ICD-10-CM

## 2024-02-08 DIAGNOSIS — Z7901 Long term (current) use of anticoagulants: Secondary | ICD-10-CM

## 2024-02-08 DIAGNOSIS — I82612 Acute embolism and thrombosis of superficial veins of left upper extremity: Secondary | ICD-10-CM | POA: Diagnosis not present

## 2024-02-08 DIAGNOSIS — I5023 Acute on chronic systolic (congestive) heart failure: Secondary | ICD-10-CM | POA: Diagnosis not present

## 2024-02-08 DIAGNOSIS — R042 Hemoptysis: Secondary | ICD-10-CM | POA: Diagnosis not present

## 2024-02-08 DIAGNOSIS — I82603 Acute embolism and thrombosis of unspecified veins of upper extremity, bilateral: Secondary | ICD-10-CM | POA: Diagnosis not present

## 2024-02-08 DIAGNOSIS — I2699 Other pulmonary embolism without acute cor pulmonale: Principal | ICD-10-CM | POA: Diagnosis present

## 2024-02-08 DIAGNOSIS — R066 Hiccough: Secondary | ICD-10-CM | POA: Diagnosis not present

## 2024-02-08 DIAGNOSIS — I10 Essential (primary) hypertension: Secondary | ICD-10-CM | POA: Diagnosis not present

## 2024-02-08 DIAGNOSIS — I5082 Biventricular heart failure: Secondary | ICD-10-CM | POA: Diagnosis present

## 2024-02-08 DIAGNOSIS — Z823 Family history of stroke: Secondary | ICD-10-CM

## 2024-02-08 DIAGNOSIS — I2510 Atherosclerotic heart disease of native coronary artery without angina pectoris: Secondary | ICD-10-CM | POA: Diagnosis not present

## 2024-02-08 DIAGNOSIS — S069XAS Unspecified intracranial injury with loss of consciousness status unknown, sequela: Secondary | ICD-10-CM | POA: Diagnosis not present

## 2024-02-08 DIAGNOSIS — K5641 Fecal impaction: Secondary | ICD-10-CM | POA: Diagnosis not present

## 2024-02-08 DIAGNOSIS — K5900 Constipation, unspecified: Secondary | ICD-10-CM | POA: Diagnosis present

## 2024-02-08 DIAGNOSIS — R110 Nausea: Secondary | ICD-10-CM | POA: Diagnosis present

## 2024-02-08 DIAGNOSIS — G40909 Epilepsy, unspecified, not intractable, without status epilepticus: Secondary | ICD-10-CM | POA: Diagnosis not present

## 2024-02-08 DIAGNOSIS — I5020 Unspecified systolic (congestive) heart failure: Secondary | ICD-10-CM | POA: Insufficient documentation

## 2024-02-08 DIAGNOSIS — I82629 Acute embolism and thrombosis of deep veins of unspecified upper extremity: Secondary | ICD-10-CM

## 2024-02-08 DIAGNOSIS — Z8673 Personal history of transient ischemic attack (TIA), and cerebral infarction without residual deficits: Secondary | ICD-10-CM | POA: Diagnosis not present

## 2024-02-08 DIAGNOSIS — F1910 Other psychoactive substance abuse, uncomplicated: Secondary | ICD-10-CM | POA: Diagnosis not present

## 2024-02-08 DIAGNOSIS — F419 Anxiety disorder, unspecified: Secondary | ICD-10-CM | POA: Diagnosis present

## 2024-02-08 DIAGNOSIS — F1410 Cocaine abuse, uncomplicated: Secondary | ICD-10-CM | POA: Diagnosis not present

## 2024-02-08 DIAGNOSIS — R0602 Shortness of breath: Secondary | ICD-10-CM | POA: Diagnosis not present

## 2024-02-08 DIAGNOSIS — Z79899 Other long term (current) drug therapy: Secondary | ICD-10-CM

## 2024-02-08 DIAGNOSIS — Z789 Other specified health status: Secondary | ICD-10-CM

## 2024-02-08 DIAGNOSIS — E785 Hyperlipidemia, unspecified: Secondary | ICD-10-CM | POA: Diagnosis present

## 2024-02-08 DIAGNOSIS — I513 Intracardiac thrombosis, not elsewhere classified: Secondary | ICD-10-CM | POA: Diagnosis present

## 2024-02-08 DIAGNOSIS — R059 Cough, unspecified: Secondary | ICD-10-CM | POA: Diagnosis not present

## 2024-02-08 DIAGNOSIS — I472 Ventricular tachycardia, unspecified: Secondary | ICD-10-CM | POA: Diagnosis not present

## 2024-02-08 DIAGNOSIS — I110 Hypertensive heart disease with heart failure: Secondary | ICD-10-CM | POA: Diagnosis present

## 2024-02-08 DIAGNOSIS — N200 Calculus of kidney: Secondary | ICD-10-CM | POA: Diagnosis not present

## 2024-02-08 DIAGNOSIS — J90 Pleural effusion, not elsewhere classified: Secondary | ICD-10-CM | POA: Diagnosis not present

## 2024-02-08 DIAGNOSIS — Z5982 Transportation insecurity: Secondary | ICD-10-CM

## 2024-02-08 DIAGNOSIS — I2602 Saddle embolus of pulmonary artery with acute cor pulmonale: Secondary | ICD-10-CM | POA: Diagnosis not present

## 2024-02-08 DIAGNOSIS — M7989 Other specified soft tissue disorders: Secondary | ICD-10-CM | POA: Diagnosis not present

## 2024-02-08 DIAGNOSIS — H9192 Unspecified hearing loss, left ear: Secondary | ICD-10-CM | POA: Diagnosis present

## 2024-02-08 DIAGNOSIS — R0989 Other specified symptoms and signs involving the circulatory and respiratory systems: Secondary | ICD-10-CM | POA: Diagnosis not present

## 2024-02-08 DIAGNOSIS — F39 Unspecified mood [affective] disorder: Secondary | ICD-10-CM | POA: Diagnosis present

## 2024-02-08 DIAGNOSIS — I509 Heart failure, unspecified: Secondary | ICD-10-CM | POA: Diagnosis not present

## 2024-02-08 DIAGNOSIS — R918 Other nonspecific abnormal finding of lung field: Secondary | ICD-10-CM | POA: Diagnosis not present

## 2024-02-08 LAB — CBC
HCT: 39.7 % (ref 39.0–52.0)
Hemoglobin: 13.2 g/dL (ref 13.0–17.0)
MCH: 33.4 pg (ref 26.0–34.0)
MCHC: 33.2 g/dL (ref 30.0–36.0)
MCV: 100.5 fL — ABNORMAL HIGH (ref 80.0–100.0)
Platelets: 410 K/uL — ABNORMAL HIGH (ref 150–400)
RBC: 3.95 MIL/uL — ABNORMAL LOW (ref 4.22–5.81)
RDW: 13.2 % (ref 11.5–15.5)
WBC: 8 K/uL (ref 4.0–10.5)
nRBC: 0 % (ref 0.0–0.2)

## 2024-02-08 LAB — LACTIC ACID, PLASMA: Lactic Acid, Venous: 2 mmol/L (ref 0.5–1.9)

## 2024-02-08 LAB — HEPATIC FUNCTION PANEL
ALT: 24 U/L (ref 0–44)
AST: 26 U/L (ref 15–41)
Albumin: 2.7 g/dL — ABNORMAL LOW (ref 3.5–5.0)
Alkaline Phosphatase: 71 U/L (ref 38–126)
Bilirubin, Direct: 0.3 mg/dL — ABNORMAL HIGH (ref 0.0–0.2)
Indirect Bilirubin: 1.5 mg/dL — ABNORMAL HIGH (ref 0.3–0.9)
Total Bilirubin: 1.8 mg/dL — ABNORMAL HIGH (ref 0.0–1.2)
Total Protein: 6.9 g/dL (ref 6.5–8.1)

## 2024-02-08 LAB — RESP PANEL BY RT-PCR (RSV, FLU A&B, COVID)  RVPGX2
Influenza A by PCR: NEGATIVE
Influenza B by PCR: NEGATIVE
Resp Syncytial Virus by PCR: NEGATIVE
SARS Coronavirus 2 by RT PCR: NEGATIVE

## 2024-02-08 LAB — BASIC METABOLIC PANEL WITH GFR
Anion gap: 13 (ref 5–15)
BUN: 16 mg/dL (ref 6–20)
CO2: 21 mmol/L — ABNORMAL LOW (ref 22–32)
Calcium: 8.7 mg/dL — ABNORMAL LOW (ref 8.9–10.3)
Chloride: 105 mmol/L (ref 98–111)
Creatinine, Ser: 1.12 mg/dL (ref 0.61–1.24)
GFR, Estimated: >60 mL/min (ref >60)
Glucose, Bld: 135 mg/dL — ABNORMAL HIGH (ref 70–99)
Potassium: 4.2 mmol/L (ref 3.5–5.1)
Sodium: 139 mmol/L (ref 135–145)

## 2024-02-08 LAB — PROTIME-INR
INR: 1.3 — ABNORMAL HIGH (ref 0.8–1.2)
Prothrombin Time: 16.5 s — ABNORMAL HIGH (ref 11.4–15.2)

## 2024-02-08 LAB — LIPASE, BLOOD: Lipase: 24 U/L (ref 11–51)

## 2024-02-08 LAB — HIV ANTIBODY (ROUTINE TESTING W REFLEX): HIV Screen 4th Generation wRfx: NONREACTIVE

## 2024-02-08 LAB — TROPONIN I (HIGH SENSITIVITY)
Troponin I (High Sensitivity): 74 ng/L — ABNORMAL HIGH (ref <18)
Troponin I (High Sensitivity): 65 ng/L — ABNORMAL HIGH (ref <18)

## 2024-02-08 LAB — TYPE AND SCREEN
ABO/RH(D): O POS
Antibody Screen: NEGATIVE

## 2024-02-08 LAB — BRAIN NATRIURETIC PEPTIDE: B Natriuretic Peptide: 2277.9 pg/mL — ABNORMAL HIGH (ref 0.0–100.0)

## 2024-02-08 LAB — HEPARIN LEVEL (UNFRACTIONATED): Heparin Unfractionated: 0.27 [IU]/mL — ABNORMAL LOW (ref 0.30–0.70)

## 2024-02-08 LAB — APTT: aPTT: 52 s — ABNORMAL HIGH (ref 24–36)

## 2024-02-08 MED ORDER — OXYCODONE HCL 5 MG PO TABS
5.0000 mg | ORAL_TABLET | Freq: Four times a day (QID) | ORAL | Status: DC | PRN
  Administered 2024-02-09: 5 mg via ORAL
  Filled 2024-02-08: qty 1

## 2024-02-08 MED ORDER — METHYLPREDNISOLONE SODIUM SUCC 40 MG IJ SOLR
40.0000 mg | Freq: Once | INTRAMUSCULAR | Status: AC
  Administered 2024-02-08: 40 mg via INTRAVENOUS
  Filled 2024-02-08: qty 1

## 2024-02-08 MED ORDER — SPIRONOLACTONE 25 MG PO TABS
25.0000 mg | ORAL_TABLET | Freq: Every day | ORAL | Status: DC
Start: 2024-02-08 — End: 2024-02-09
  Administered 2024-02-08 – 2024-02-09 (×2): 25 mg via ORAL
  Filled 2024-02-08: qty 2
  Filled 2024-02-08: qty 1

## 2024-02-08 MED ORDER — MIDAZOLAM HCL 2 MG/2ML IJ SOLN
2.0000 mg | Freq: Once | INTRAMUSCULAR | Status: AC
Start: 2024-02-08 — End: 2024-02-08
  Administered 2024-02-08: 2 mg via INTRAVENOUS
  Filled 2024-02-08: qty 2

## 2024-02-08 MED ORDER — ACETAMINOPHEN 325 MG PO TABS: 650.0000 mg | ORAL_TABLET | Freq: Four times a day (QID) | ORAL | Status: DC | PRN

## 2024-02-08 MED ORDER — HEPARIN BOLUS VIA INFUSION
3000.0000 [IU] | Freq: Once | INTRAVENOUS | Status: AC
  Administered 2024-02-08: 3000 [IU] via INTRAVENOUS
  Filled 2024-02-08: qty 3000

## 2024-02-08 MED ORDER — ROSUVASTATIN CALCIUM 5 MG PO TABS
10.0000 mg | ORAL_TABLET | Freq: Every day | ORAL | Status: DC
  Administered 2024-02-08 – 2024-02-15 (×8): 10 mg via ORAL
  Filled 2024-02-08 (×8): qty 2

## 2024-02-08 MED ORDER — ONDANSETRON HCL 4 MG/2ML IJ SOLN
4.0000 mg | Freq: Once | INTRAMUSCULAR | Status: AC
  Administered 2024-02-08: 4 mg via INTRAVENOUS
  Filled 2024-02-08: qty 2

## 2024-02-08 MED ORDER — ADULT MULTIVITAMIN W/MINERALS CH
1.0000 | ORAL_TABLET | Freq: Every day | ORAL | Status: DC
  Administered 2024-02-09 – 2024-02-15 (×7): 1 via ORAL
  Filled 2024-02-08 (×7): qty 1

## 2024-02-08 MED ORDER — FOLIC ACID 1 MG PO TABS
1.0000 mg | ORAL_TABLET | Freq: Every day | ORAL | Status: DC
  Administered 2024-02-08 – 2024-02-15 (×8): 1 mg via ORAL
  Filled 2024-02-08 (×8): qty 1

## 2024-02-08 MED ORDER — DIPHENHYDRAMINE HCL 50 MG/ML IJ SOLN
50.0000 mg | Freq: Once | INTRAMUSCULAR | Status: AC
  Administered 2024-02-08: 50 mg via INTRAVENOUS
  Filled 2024-02-08: qty 1

## 2024-02-08 MED ORDER — MORPHINE SULFATE (PF) 4 MG/ML IV SOLN
4.0000 mg | Freq: Once | INTRAVENOUS | Status: AC
  Administered 2024-02-08: 4 mg via INTRAVENOUS
  Filled 2024-02-08: qty 1

## 2024-02-08 MED ORDER — OXYCODONE HCL 5 MG PO TABS
10.0000 mg | ORAL_TABLET | Freq: Four times a day (QID) | ORAL | Status: DC | PRN
  Filled 2024-02-08: qty 2

## 2024-02-08 MED ORDER — HYDROXYZINE HCL 10 MG PO TABS
10.0000 mg | ORAL_TABLET | Freq: Three times a day (TID) | ORAL | Status: DC | PRN
  Administered 2024-02-08 – 2024-02-14 (×3): 10 mg via ORAL
  Filled 2024-02-08 (×3): qty 1

## 2024-02-08 MED ORDER — HEPARIN (PORCINE) 25000 UT/250ML-% IV SOLN
2400.0000 [IU]/h | INTRAVENOUS | Status: DC
  Administered 2024-02-08: 1700 [IU]/h via INTRAVENOUS
  Administered 2024-02-08 – 2024-02-10 (×3): 2000 [IU]/h via INTRAVENOUS
  Administered 2024-02-10: 2300 [IU]/h via INTRAVENOUS
  Filled 2024-02-08 (×6): qty 250

## 2024-02-08 MED ORDER — DAPAGLIFLOZIN PROPANEDIOL 10 MG PO TABS
10.0000 mg | ORAL_TABLET | Freq: Every day | ORAL | Status: DC
  Administered 2024-02-08 – 2024-02-15 (×8): 10 mg via ORAL
  Filled 2024-02-08 (×8): qty 1

## 2024-02-08 MED ORDER — SACUBITRIL-VALSARTAN 24-26 MG PO TABS
1.0000 | ORAL_TABLET | Freq: Two times a day (BID) | ORAL | Status: DC
  Administered 2024-02-08 – 2024-02-09 (×2): 1 via ORAL
  Filled 2024-02-08 (×2): qty 1

## 2024-02-08 MED ORDER — DIPHENHYDRAMINE HCL 25 MG PO CAPS: 50.0000 mg | ORAL_CAPSULE | Freq: Once | ORAL | Status: AC

## 2024-02-08 MED ORDER — IOHEXOL 350 MG/ML SOLN
75.0000 mL | Freq: Once | INTRAVENOUS | Status: AC | PRN
  Administered 2024-02-08: 75 mL via INTRAVENOUS

## 2024-02-08 MED ORDER — HEPARIN BOLUS VIA INFUSION
5000.0000 [IU] | Freq: Once | INTRAVENOUS | Status: AC
  Administered 2024-02-08: 5000 [IU] via INTRAVENOUS
  Filled 2024-02-08: qty 5000

## 2024-02-08 MED ORDER — ACETAMINOPHEN 650 MG RE SUPP: 650.0000 mg | Freq: Four times a day (QID) | RECTAL | Status: DC | PRN

## 2024-02-08 MED ORDER — POLYETHYLENE GLYCOL 3350 17 G PO PACK
17.0000 g | PACK | Freq: Every day | ORAL | Status: DC
  Filled 2024-02-08 (×2): qty 1

## 2024-02-08 MED ORDER — DEXTROMETHORPHAN POLISTIREX ER 30 MG/5ML PO SUER
30.0000 mg | Freq: Two times a day (BID) | ORAL | Status: DC
  Administered 2024-02-08 – 2024-02-13 (×10): 30 mg via ORAL
  Filled 2024-02-08 (×13): qty 5

## 2024-02-08 MED ORDER — BENZONATATE 100 MG PO CAPS
200.0000 mg | ORAL_CAPSULE | Freq: Once | ORAL | Status: AC
  Administered 2024-02-08: 200 mg via ORAL
  Filled 2024-02-08: qty 2

## 2024-02-08 MED ORDER — DIGOXIN 125 MCG PO TABS
0.1250 mg | ORAL_TABLET | Freq: Every day | ORAL | Status: DC
Start: 2024-02-08 — End: 2024-02-15
  Administered 2024-02-08 – 2024-02-15 (×8): 0.125 mg via ORAL
  Filled 2024-02-08 (×8): qty 1

## 2024-02-08 NOTE — ED Triage Notes (Signed)
 Pt states that he has had SOB and left sided chest and rib pain x 4 days. Pt also reports that he has been coughing up a significant amount of bright red blood x 1 day.

## 2024-02-08 NOTE — ED Notes (Signed)
 Patient transported to CT

## 2024-02-08 NOTE — Assessment & Plan Note (Addendum)
 History of seizures - appears to have been remote in the setting of traumatic brain bleed, not currently on any medications HLD - continue home rosuvastatin  10 mg Drug abuse history - continue home folic acid  1 mg daily and multivitamin with minerals Anxiety - continue home Atarax  10 mg 3 times daily as needed Constipation - continue home MiraLAX  17 g packet daily

## 2024-02-08 NOTE — Progress Notes (Signed)
 PHARMACY - ANTICOAGULATION CONSULT NOTE  Pharmacy Consult for Heparin  Indication: pulmonary embolus  No Known Allergies  Patient Measurements: Ht 6'6 Wt ~100 kg Height: 6' 6 (198.1 cm) Weight: 104.3 kg (229 lb 14.4 oz) IBW/kg (Calculated) : 91.4 HEPARIN  DW (KG): 104.3  Vital Signs: Temp: 98.9 F (37.2 C) (09/15 2018) Temp Source: Oral (09/15 2018) BP: 136/105 (09/15 2018) Pulse Rate: 136 (09/15 2018)  Labs: Recent Labs    02/08/24 0551 02/08/24 0625 02/08/24 0753 02/08/24 2106  HGB 13.2  --   --   --   HCT 39.7  --   --   --   PLT 410*  --   --   --   APTT  --   --   --  52*  LABPROT  --  16.5*  --   --   INR  --  1.3*  --   --   HEPARINUNFRC  --   --   --  0.27*  CREATININE 1.12  --   --   --   TROPONINIHS 74*  --  65*  --     Estimated Creatinine Clearance: 100.9 mL/min (by C-G formula based on SCr of 1.12 mg/dL).   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 M presents with SOB, CP, hemoptysis. Noted pt on apixaban  pta for h/o PE/DVT (Feb 2025)- last dose 9/14 2030. Pt reports compliance. CT shows new acute occlusive PE within the left lower lob segmental pulmonary artery supplying the medial basilar segment. Plan to start heparin  gtt. Apixaban  will be affecting heparin  levels so will utilize aPTT until levels correlating.  9/15 PM update: subtherapeutic at aPTT 52, Heparin  level 0.27, believe aPTT more accurate evaluation of anticoag status.  Goal of Therapy:  Heparin  level 0.3-0.7 units/ml aPTT 66-102 seconds Monitor platelets by anticoagulation protocol: Yes   Plan: Heparin  IV bolus 3000 units Heparin  gtt increased to 2000 units/hr Will f/u aPTT/Heparin  level in 6 hours Daily heparin  level, aPTT, and CBC  Larraine Brazier, PharmD Clinical Pharmacist 02/08/2024  10:24 PM **Pharmacist phone directory can now be found on [email or web address] ([email or web address]).  Listed under [facility].

## 2024-02-08 NOTE — Progress Notes (Signed)
 PHARMACY - ANTICOAGULATION CONSULT NOTE  Pharmacy Consult for Heparin  Indication: pulmonary embolus  No Known Allergies  Patient Measurements: Ht 6'6 Wt ~100 kg    Vital Signs: Temp: 98.7 F (37.1 C) (09/15 1011) Temp Source: Oral (09/15 1011) BP: 134/101 (09/15 1145) Pulse Rate: 128 (09/15 1145)  Labs: Recent Labs    02/08/24 0551 02/08/24 0625 02/08/24 0753  HGB 13.2  --   --   HCT 39.7  --   --   PLT 410*  --   --   LABPROT  --  16.5*  --   INR  --  1.3*  --   CREATININE 1.12  --   --   TROPONINIHS 74*  --  65*    CrCl cannot be calculated (Unknown ideal weight.).   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 M presents with SOB, CP, hemoptysis. Noted pt on apixaban  pta for h/o PE/DVT (Feb 2025)- last dose 8/14 2030. Pt reports compliance. CT shows new acute occlusive PE within the left lower lob segmental pulmonary artery supplying the medial basilar segment. Plan to start heparin  gtt. Apixaban  will be affecting heparin  levels so will utilize aPTT until levels correlating.  Goal of Therapy:  Heparin  level 0.3-0.7 units/ml aPTT 66-102 seconds Monitor platelets by anticoagulation protocol: Yes   Plan: Baseline aPTT and heparin  level  Heparin  IV bolus 5000 units Heparin  gtt at 1700 units/hr Will f/u aPTT in 6 hours Daily heparin  level, aPTT, and CBC  Vito Ralph, PharmD, BCPS Please see amion for complete clinical pharmacist phone list 02/08/2024,12:38 PM

## 2024-02-08 NOTE — ED Notes (Signed)
 CCMD called.

## 2024-02-08 NOTE — Assessment & Plan Note (Addendum)
 Tachycardic, tachypneic, hypertensive though overall stable and without oxygen requirement.  CTA PE with acute PE in LLL with airspace opacity consistent with pulmonary infarction versus focal alveolar hemorrhage.  He has endorsed hemoptysis which is also likely contributing to his chest wall pain.  While BNP elevated, troponins are overall flat.  Obtaining echo to further assess RV strain given this. High risk PESI score. Given his high risk of decompensation with elevated BNP and concern for RV strain, I consulted PCCM who will see patient. - Admit to FMTS, attending Dr. Rosalynn - PCCM consulted, appreciate recommendations - Vital signs per floor - Continue heparin  infusion, we will need to discuss a different long-term anticoagulation regimen for him - Echo - DVT US  bilateral - Pain regimen  - Tylenol  650 mg every 6 hours as needed  - Oxycodone  5 mg every 6 hours as needed for moderate pain   - OR oxycodone  10 mg every 6 hours as needed for severe pain - Zofran  4 mg ODT every 8 hours as needed (will use ODT as QTc was prolonged at 491) - Repeat EKG in AM - Fall precautions - Seizure precautions
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
## 2024-02-08 NOTE — ED Provider Notes (Signed)
 I received the patient in signout.  Plan is for CT angiogram to evaluate for new pulmonary embolism.  This was positive for PE.  The patient's troponin and BNP are mildly elevated.  The patient has had this despite being on Eliquis .  Will start the patient on heparin  infusion.  He is not requiring any oxygen but is very symptomatic.  A call is placed for medical admission. Physical Exam  BP (!) 134/101   Pulse (!) 128   Temp 98.7 F (37.1 C) (Oral)   Resp (!) 22   SpO2 99%   Physical Exam General: Appears uncomfortable but speaking in full sentences  Procedures  Procedures  ED Course / MDM    Medical Decision Making Amount and/or Complexity of Data Reviewed Labs: ordered. Radiology: ordered.  Risk Prescription drug management. Decision regarding hospitalization.          Ula Prentice SAUNDERS, MD 02/08/24 1235

## 2024-02-08 NOTE — Assessment & Plan Note (Signed)
 Elevated in the hospital secondary to pain and PE. -Continuing GDMT as above
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
## 2024-02-08 NOTE — ED Provider Notes (Signed)
 Emergency Department Provider Note   I have reviewed the triage vital signs and the nursing notes.   HISTORY  Chief Complaint Shortness of Breath   HPI Anthony Singleton is a 53 y.o. male with PMH of PE, CAP and parapneumonic effusion, cardiomyopathy, presents to the ED with SOB, cough and hemoptysis.  He is having left-sided chest and rib pain.  Symptoms worsening over the past 4 days.  Patient describes his hemoptysis as bright red with clots. He is on Eliquis  and compliant. No fever. No chills.   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   Tinnitus, left     Review of Systems  Constitutional: No fever/chills Cardiovascular: Positive chest pain. Respiratory: Positive shortness of breath. Gastrointestinal: No abdominal pain.  No nausea, no vomiting.  Musculoskeletal: Negative for back pain. Skin: Negative for rash. Neurological: Negative for headaches.   ____________________________________________   PHYSICAL EXAM:  VITAL SIGNS: ED Triage Vitals  Encounter Vitals Group     BP 02/08/24 0538 (!) 147/110     Pulse Rate 02/08/24 0538 (!) 127     Resp 02/08/24 0538 (!) 22     Temp 02/08/24 0538 98.9 F (37.2 C)     Temp src --      SpO2 02/08/24 0538 97 %   Constitutional: Alert and oriented. Well appearing and in no acute distress. Eyes: Conjunctivae are normal.  Head: Atraumatic. Nose: No congestion/rhinnorhea. Mouth/Throat: Mucous membranes are moist. Neck: No stridor.   Cardiovascular: Tachycardia. Good peripheral circulation. Grossly normal heart sounds.   Respiratory: Increased respiratory effort.  No retractions. Lungs with crackles at the bases.  Gastrointestinal: No distention.  Musculoskeletal:  No gross deformities of extremities. Neurologic:  Normal speech and language.  Skin:  Skin is warm, dry and intact. No  rash noted.   ____________________________________________   LABS (all labs ordered are listed, but only abnormal results are displayed)  Labs Reviewed  CBC - Abnormal; Notable for the following components:      Result Value   RBC 3.95 (*)    MCV 100.5 (*)    Platelets 410 (*)    All other components within normal limits  RESP PANEL BY RT-PCR (RSV, FLU A&B, COVID)  RVPGX2  BASIC METABOLIC PANEL WITH GFR  BRAIN NATRIURETIC PEPTIDE  PROTIME-INR  HEPATIC FUNCTION PANEL  LIPASE, BLOOD  TYPE AND SCREEN  TROPONIN I (HIGH SENSITIVITY)   ____________________________________________  EKG   EKG Interpretation Date/Time:  Monday February 08 2024 05:45:36 EDT Ventricular Rate:  127 PR Interval:  138 QRS Duration:  102 QT Interval:  338 QTC Calculation: 491 R Axis:   55  Text Interpretation: Sinus tachycardia Possible Left atrial enlargement ST & T wave abnormality, consider inferolateral ischemia Abnormal ECG When compared with ECG of 30-Jul-2023 14:22, PREVIOUS ECG IS PRESENT Confirmed by Darra Chew [phone]) on 02/08/2024 5:55:07 AM        ____________________________________________  RADIOLOGY  DG Chest Portable 1 View Result Date: 02/08/2024 CLINICAL DATA:  Shortness of breath and chest pain.  Cough. EXAM: PORTABLE CHEST 1 VIEW COMPARISON:  01/31/2024 FINDINGS: The cardio pericardial silhouette is enlarged. Patchy opacity in the lung bases compatible with atelectasis or infiltrate. No pulmonary edema. Probable small bilateral pleural effusions. No acute bony abnormality. Telemetry leads overlie the chest. IMPRESSION: Patchy bibasilar atelectasis or  infiltrate with probable small bilateral pleural effusions. Electronically Signed   By: Camellia Candle M.D.   On: 02/08/2024 06:16    ____________________________________________   PROCEDURES  Procedure(s) performed:   Procedures   ____________________________________________   INITIAL IMPRESSION / ASSESSMENT AND PLAN  / ED COURSE  Pertinent labs & imaging results that were available during my care of the patient were reviewed by me and considered in my medical decision making (see chart for details).   This patient is Presenting for Evaluation of CP, which does require a range of treatment options, and is a complaint that involves a high risk of morbidity and mortality.  The Differential Diagnoses includes but is not exclusive to acute coronary syndrome, aortic dissection, pulmonary embolism, cardiac tamponade, community-acquired pneumonia, pericarditis, musculoskeletal chest wall pain, etc.   Critical Interventions-    Medications - No data to display  Reassessment after intervention:    Clinical Laboratory Tests Ordered, included CBC without leukocytosis. ***  Radiologic Tests Ordered, included CXR. I independently interpreted the images and agree with radiology interpretation.   Cardiac Monitor Tracing which shows tachycardia.    Social Determinants of Health Risk patient is a non-smoker.   Consult complete with  Medical Decision Making: Summary:  Patient presents to the ED with chest pain, shortness of breath, hemoptysis.  He is anticoagulated with history of PE, pneumonia, parapneumonic effusion.   Reevaluation with update and discussion with   ***Considered admission***  Patient's presentation is most consistent with acute presentation with potential threat to life or bodily function.   Disposition:   ____________________________________________  FINAL CLINICAL IMPRESSION(S) / ED DIAGNOSES  Final diagnoses:  None     NEW OUTPATIENT MEDICATIONS STARTED DURING THIS VISIT:  New Prescriptions   No medications on file    Note:  This document was prepared using Dragon voice recognition software and may include unintentional dictation errors.  Fonda Law, MD, [facility]

## 2024-02-08 NOTE — Assessment & Plan Note (Addendum)
 Echo done on 07/15/2023 showed EF of 20 to 25%.  BNP of 2277.9 though appears euvolemic on our exam, some concern for RV dysfunction with this high of an elevation; PCCM consulted as above.  Troponin 74 with repeat of 65. - Repeat echo as above - Will continue home Farxiga  10 mg daily, digoxin  0.125 mg daily, Entresto  24-26 mg BID, and spironolactone  25 mg daily - Consider adding back home prn lasix  if needed

## 2024-02-08 NOTE — ED Notes (Signed)
 Pt ambulated to restroom. Steady gait. Complains of some side pain.

## 2024-02-08 NOTE — Plan of Care (Signed)
 FMTS Interim Progress Note  S: Patient seen at bedside. Pt reports he is having pain along left side of torso and upper thigh. Denies shortness of breath. States his left leg is more edematous than usual. Reports he had previously been seen at the heart failure clinic and is prescribed torsemide. Reports he has not been taking diuretic because it makes him urinate too much. States his dry weight is 223 pounds.  O: BP (!) 136/105 (BP Location: Right Arm)   Pulse (!) 136   Temp 98.9 F (37.2 C) (Oral)   Resp 19   Ht 6' 6 (1.981 m)   Wt 104.3 kg   SpO2 99%   BMI 26.57 kg/m    General: lying in bed, NAD Cardio: normal sinus rhythm, tachycardic Respiratory: anterior fields clear to auscultation bilaterally GI: soft, non-distended, normal bowel sounds Neuro: alert and oriented Psych: pleasant and appropriate affect  A/P:  Pulmonary embolism Stable on room air. Remains tachycardic, hypertensive. Normal sinus rhythm. --Continue heparin  infusion --Echo and DVT US  bilateral pending --Continue pain regimen --Repeat EKG in AM  HFrEF Patient reports increased left leg edema in setting of non-adherence to home diuretic medication. His current weight is 230 lbs. Dry weight of 223 lbs. --Echo pending as above --continue home GDMT --PCCM consulted --Consider restarting diuretic, will defer to day team  HTN Elevated secondary to pain and PE. -CTM  Mannie Ashley SAILOR, MD 02/08/2024, 10:19 PM PGY-1, [facility] Service pager [phone]

## 2024-02-09 ENCOUNTER — Other Ambulatory Visit (HOSPITAL_COMMUNITY)

## 2024-02-09 ENCOUNTER — Telehealth (HOSPITAL_COMMUNITY): Payer: Self-pay | Admitting: Pharmacy Technician

## 2024-02-09 ENCOUNTER — Other Ambulatory Visit: Payer: Self-pay

## 2024-02-09 ENCOUNTER — Other Ambulatory Visit (HOSPITAL_COMMUNITY): Payer: Self-pay

## 2024-02-09 ENCOUNTER — Inpatient Hospital Stay (HOSPITAL_COMMUNITY)

## 2024-02-09 DIAGNOSIS — I5020 Unspecified systolic (congestive) heart failure: Secondary | ICD-10-CM | POA: Diagnosis not present

## 2024-02-09 DIAGNOSIS — I2602 Saddle embolus of pulmonary artery with acute cor pulmonale: Secondary | ICD-10-CM | POA: Diagnosis not present

## 2024-02-09 DIAGNOSIS — I5082 Biventricular heart failure: Secondary | ICD-10-CM | POA: Diagnosis not present

## 2024-02-09 DIAGNOSIS — I2699 Other pulmonary embolism without acute cor pulmonale: Secondary | ICD-10-CM | POA: Diagnosis not present

## 2024-02-09 LAB — BASIC METABOLIC PANEL WITH GFR
Anion gap: 11 (ref 5–15)
BUN: 17 mg/dL (ref 6–20)
CO2: 20 mmol/L — ABNORMAL LOW (ref 22–32)
Calcium: 8.6 mg/dL — ABNORMAL LOW (ref 8.9–10.3)
Chloride: 103 mmol/L (ref 98–111)
Creatinine, Ser: 0.99 mg/dL (ref 0.61–1.24)
GFR, Estimated: >60 mL/min (ref >60)
Glucose, Bld: 123 mg/dL — ABNORMAL HIGH (ref 70–99)
Potassium: 4.6 mmol/L (ref 3.5–5.1)
Sodium: 134 mmol/L — ABNORMAL LOW (ref 135–145)

## 2024-02-09 LAB — CBC
HCT: 37.9 % — ABNORMAL LOW (ref 39.0–52.0)
HCT: 37.5 % — ABNORMAL LOW (ref 39.0–52.0)
Hemoglobin: 12.7 g/dL — ABNORMAL LOW (ref 13.0–17.0)
Hemoglobin: 12.7 g/dL — ABNORMAL LOW (ref 13.0–17.0)
MCH: 32.9 pg (ref 26.0–34.0)
MCH: 33.3 pg (ref 26.0–34.0)
MCHC: 33.5 g/dL (ref 30.0–36.0)
MCHC: 33.9 g/dL (ref 30.0–36.0)
MCV: 98.2 fL (ref 80.0–100.0)
MCV: 98.4 fL (ref 80.0–100.0)
Platelets: 408 K/uL — ABNORMAL HIGH (ref 150–400)
Platelets: 425 K/uL — ABNORMAL HIGH (ref 150–400)
RBC: 3.86 MIL/uL — ABNORMAL LOW (ref 4.22–5.81)
RBC: 3.81 MIL/uL — ABNORMAL LOW (ref 4.22–5.81)
RDW: 13.2 % (ref 11.5–15.5)
RDW: 13.2 % (ref 11.5–15.5)
WBC: 10.6 K/uL — ABNORMAL HIGH (ref 4.0–10.5)
WBC: 11.3 K/uL — ABNORMAL HIGH (ref 4.0–10.5)
nRBC: 0 % (ref 0.0–0.2)
nRBC: 0 % (ref 0.0–0.2)

## 2024-02-09 LAB — RAPID URINE DRUG SCREEN, HOSP PERFORMED
Amphetamines: NOT DETECTED
Barbiturates: NOT DETECTED
Benzodiazepines: NOT DETECTED
Cocaine: NOT DETECTED
Opiates: NOT DETECTED
Tetrahydrocannabinol: NOT DETECTED

## 2024-02-09 LAB — HEPARIN LEVEL (UNFRACTIONATED)
Heparin Unfractionated: 0.61 [IU]/mL (ref 0.30–0.70)
Heparin Unfractionated: 0.45 [IU]/mL (ref 0.30–0.70)

## 2024-02-09 LAB — TROPONIN I (HIGH SENSITIVITY)
Troponin I (High Sensitivity): 58 ng/L — ABNORMAL HIGH (ref <18)
Troponin I (High Sensitivity): 53 ng/L — ABNORMAL HIGH (ref <18)

## 2024-02-09 LAB — COOXEMETRY PANEL
Carboxyhemoglobin: 1.4 % (ref 0.5–1.5)
Carboxyhemoglobin: 1.6 % — ABNORMAL HIGH (ref 0.5–1.5)
Methemoglobin: <0.7 % (ref 0.0–1.5)
Methemoglobin: 0.7 % (ref 0.0–1.5)
O2 Saturation: 56.9 %
O2 Saturation: 59.2 %
Total hemoglobin: 14.6 g/dL (ref 12.0–16.0)
Total hemoglobin: 14.1 g/dL (ref 12.0–16.0)

## 2024-02-09 LAB — ECHOCARDIOGRAM COMPLETE
Est EF: <20
Height: 78 in
S' Lateral: 5.9 cm
Weight: 3650.82 [oz_av]

## 2024-02-09 LAB — VITAMIN B12: Vitamin B-12: 230 pg/mL (ref 180–914)

## 2024-02-09 LAB — LACTIC ACID, PLASMA: Lactic Acid, Venous: 1.9 mmol/L (ref 0.5–1.9)

## 2024-02-09 LAB — BRAIN NATRIURETIC PEPTIDE: B Natriuretic Peptide: 1666.8 pg/mL — ABNORMAL HIGH (ref 0.0–100.0)

## 2024-02-09 LAB — APTT: aPTT: 103 s — ABNORMAL HIGH (ref 24–36)

## 2024-02-09 LAB — CG4 I-STAT (LACTIC ACID): Lactic Acid, Venous: 1 mmol/L (ref 0.5–1.9)

## 2024-02-09 MED ORDER — METHOCARBAMOL 1000 MG/10ML IJ SOLN: 1000.0000 mg | Freq: Three times a day (TID) | INTRAMUSCULAR | Status: DC

## 2024-02-09 MED ORDER — CHLORHEXIDINE GLUCONATE CLOTH 2 % EX PADS
6.0000 | MEDICATED_PAD | Freq: Every day | CUTANEOUS | Status: DC
Start: 2024-02-09 — End: 2024-02-13
  Administered 2024-02-09 – 2024-02-12 (×4): 6 via TOPICAL

## 2024-02-09 MED ORDER — HYDROMORPHONE HCL 1 MG/ML IJ SOLN
1.0000 mg | INTRAMUSCULAR | Status: DC | PRN
  Administered 2024-02-09: 1 mg via INTRAVENOUS
  Filled 2024-02-09: qty 1

## 2024-02-09 MED ORDER — KETOROLAC TROMETHAMINE 15 MG/ML IJ SOLN
15.0000 mg | Freq: Three times a day (TID) | INTRAMUSCULAR | Status: DC
  Administered 2024-02-09 – 2024-02-12 (×8): 15 mg via INTRAVENOUS
  Filled 2024-02-09 (×8): qty 1

## 2024-02-09 MED ORDER — FUROSEMIDE 10 MG/ML IJ SOLN: 80.0000 mg | Freq: Once | INTRAMUSCULAR | Status: DC

## 2024-02-09 MED ORDER — ACETAMINOPHEN 500 MG PO TABS
1000.0000 mg | ORAL_TABLET | Freq: Four times a day (QID) | ORAL | Status: DC
Start: 2024-02-09 — End: 2024-02-09
  Administered 2024-02-09: 1000 mg via ORAL
  Filled 2024-02-09: qty 2

## 2024-02-09 MED ORDER — ACETAMINOPHEN 650 MG RE SUPP: 650.0000 mg | Freq: Four times a day (QID) | RECTAL | Status: DC

## 2024-02-09 MED ORDER — SODIUM CHLORIDE 0.9% FLUSH: 10.0000 mL | INTRAVENOUS | Status: DC | PRN

## 2024-02-09 MED ORDER — FUROSEMIDE 10 MG/ML IJ SOLN
40.0000 mg | Freq: Once | INTRAMUSCULAR | Status: AC
  Administered 2024-02-09: 40 mg via INTRAVENOUS
  Filled 2024-02-09: qty 4

## 2024-02-09 MED ORDER — OXYCODONE HCL 5 MG PO TABS
10.0000 mg | ORAL_TABLET | Freq: Four times a day (QID) | ORAL | Status: DC | PRN
  Administered 2024-02-09 – 2024-02-13 (×9): 10 mg via ORAL
  Filled 2024-02-09 (×9): qty 2

## 2024-02-09 MED ORDER — HYDROMORPHONE HCL 1 MG/ML IJ SOLN
1.0000 mg | INTRAMUSCULAR | Status: DC | PRN
  Administered 2024-02-10: 1 mg via INTRAVENOUS
  Administered 2024-02-11: 2 mg via INTRAVENOUS
  Filled 2024-02-09: qty 1
  Filled 2024-02-09: qty 2
  Filled 2024-02-09: qty 1

## 2024-02-09 MED ORDER — HYDROMORPHONE HCL 1 MG/ML IJ SOLN
0.5000 mg | INTRAMUSCULAR | Status: DC | PRN
  Administered 2024-02-09: 0.5 mg via INTRAVENOUS
  Filled 2024-02-09: qty 1

## 2024-02-09 MED ORDER — SENNOSIDES-DOCUSATE SODIUM 8.6-50 MG PO TABS
1.0000 | ORAL_TABLET | Freq: Every day | ORAL | Status: DC
  Administered 2024-02-09: 1 via ORAL
  Filled 2024-02-09 (×2): qty 1

## 2024-02-09 MED ORDER — MILRINONE LACTATE IN DEXTROSE 20-5 MG/100ML-% IV SOLN
0.1250 ug/kg/min | INTRAVENOUS | Status: DC
  Administered 2024-02-09 – 2024-02-10 (×2): 0.25 ug/kg/min via INTRAVENOUS
  Filled 2024-02-09 (×3): qty 100

## 2024-02-09 MED ORDER — KETOROLAC TROMETHAMINE 15 MG/ML IJ SOLN: 15.0000 mg | Freq: Three times a day (TID) | INTRAMUSCULAR | Status: DC

## 2024-02-09 MED ORDER — PERFLUTREN LIPID MICROSPHERE
1.0000 mL | INTRAVENOUS | Status: AC | PRN
  Administered 2024-02-09: 4 mL via INTRAVENOUS

## 2024-02-09 MED ORDER — SODIUM CHLORIDE 0.9% FLUSH
10.0000 mL | Freq: Two times a day (BID) | INTRAVENOUS | Status: DC
  Administered 2024-02-09: 10 mL
  Administered 2024-02-10: 20 mL
  Administered 2024-02-10 – 2024-02-12 (×4): 10 mL

## 2024-02-09 MED ORDER — ACETAMINOPHEN 325 MG PO TABS
650.0000 mg | ORAL_TABLET | Freq: Four times a day (QID) | ORAL | Status: DC
  Administered 2024-02-09 – 2024-02-15 (×21): 650 mg via ORAL
  Filled 2024-02-09 (×22): qty 2

## 2024-02-09 MED ORDER — FUROSEMIDE 20 MG PO TABS
20.0000 mg | ORAL_TABLET | Freq: Every day | ORAL | Status: DC
Start: 2024-02-09 — End: 2024-02-09
  Administered 2024-02-09: 20 mg via ORAL
  Filled 2024-02-09: qty 1

## 2024-02-09 MED ORDER — OXYCODONE HCL 5 MG PO TABS
5.0000 mg | ORAL_TABLET | Freq: Four times a day (QID) | ORAL | Status: DC | PRN
  Administered 2024-02-10 – 2024-02-12 (×2): 5 mg via ORAL
  Filled 2024-02-09 (×3): qty 1

## 2024-02-09 MED ORDER — METHOCARBAMOL 1000 MG/10ML IJ SOLN
1000.0000 mg | Freq: Three times a day (TID) | INTRAMUSCULAR | Status: AC
Start: 2024-02-09 — End: 2024-02-11
  Administered 2024-02-09 – 2024-02-11 (×6): 1000 mg via INTRAVENOUS
  Filled 2024-02-09 (×6): qty 10
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
## 2024-02-09 NOTE — Progress Notes (Signed)
   Discussed with Dr Claudene and Dr Gardenia.   Imaged reviewed. Plan to move to 2H. Transferring to CCM service.   PICC being placed. Check CO-OX and CVP.   Kashonda Sarkisyan NP-C  4:47 PM

## 2024-02-09 NOTE — Assessment & Plan Note (Addendum)
 Patient present w/ Tachycardic, tachypneic. CTA PE with acute PE in LLL with airspace opacity consistent with pulmonary infarction versus focal alveolar hemorrhage.   - Continue progressive status  - Continue heparin  infusion - Pending DVT US  bilateral   - Pain regimen Tylenol  650 mg every 6 hours as needed Oxycodone  5 mg every 6 hours as needed for moderate pain  Oxycodone  10 mg every 6 hours as needed for severe pain Dilaudid  for severe pain

## 2024-02-09 NOTE — Plan of Care (Signed)
  Problem: Education: Goal: Knowledge of General Education information will improve Description: Including pain rating scale, medication(s)/side effects and non-pharmacologic comfort measures Outcome: Progressing   Problem: Clinical Measurements: Goal: Diagnostic test results will improve Outcome: Progressing   Problem: Clinical Measurements: Goal: Cardiovascular complication will be avoided Outcome: Progressing   Problem: Clinical Measurements: Goal: Respiratory complications will improve Outcome: Progressing

## 2024-02-09 NOTE — Assessment & Plan Note (Addendum)
 Echo on 07/15/2023 showed EF of 20 to 25%.  BNP of 2277.9 c/o RV dysfunction - Appreciate Advanced heart failure consult and care  - PCCM consulted, pending recommendation  - Echo shows RV thrombus  - Continue home Farxiga  10 mg daily, digoxin  0.125 mg daily - Hold home Entresto , beta-blocker, and spironolactone 

## 2024-02-09 NOTE — Assessment & Plan Note (Addendum)
 Elevated in the hospital secondary to pain and PE. -Continuing GDMT as above

## 2024-02-09 NOTE — Progress Notes (Signed)
 Peripherally Inserted Central Catheter Placement  The IV Nurse has discussed with the patient and/or persons authorized to consent for the patient, the purpose of this procedure and the potential benefits and risks involved with this procedure.  The benefits include less needle sticks, lab draws from the catheter, and the patient may be discharged home with the catheter. Risks include, but not limited to, infection, bleeding, blood clot (thrombus formation), and puncture of an artery; nerve damage and irregular heartbeat and possibility to perform a PICC exchange if needed/ordered by physician.  Alternatives to this procedure were also discussed.  Bard Power PICC patient education guide, fact sheet on infection prevention and patient information card has been provided to patient /or left at bedside.    PICC Placement Documentation  PICC Double Lumen 02/09/24 Right Basilic 42 cm 2 cm (Active)  Indication for Insertion or Continuance of Line Vasoactive infusions 02/09/24 1624  Exposed Catheter (cm) 2 cm 02/09/24 1624  Site Assessment Clean, Dry, Intact 02/09/24 1624  Lumen #1 Status Flushed;Blood return noted;Saline locked 02/09/24 1624  Lumen #2 Status Flushed;Blood return noted;Saline locked 02/09/24 1624  Dressing Type Transparent 02/09/24 1624  Dressing Status Antimicrobial disc/dressing in place 02/09/24 1624  Line Care Connections checked and tightened 02/09/24 1624  Line Adjustment (NICU/IV Team Only) No 02/09/24 1624  Dressing Intervention New dressing 02/09/24 1624  Dressing Change Due 02/16/24 02/09/24 1624       Aron Maryalice Dolores 02/09/2024, 4:26 PM

## 2024-02-09 NOTE — Progress Notes (Signed)
 eLink Physician-Brief Progress Note Patient Name: Anthony Singleton DOB: 18-Apr-1971 MRN: [id number]   Date of Service  02/09/2024  HPI/Events of Note  9 beat run of VT  eICU Interventions  Replete electrolytes with AM labs     Intervention Category Intermediate Interventions: Arrhythmia - evaluation and management  Cass Vandermeulen 02/09/2024, 10:40 PM
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
## 2024-02-09 NOTE — Progress Notes (Signed)
 Heart Failure Navigator Progress Note  Assessed for Heart & Vascular TOC clinic readiness.  Patient does not meet criteria due to Advanced Heart Failure Team patient of Dr. Shirlee Latch.   Navigator will sign off at this time.    Rhae Hammock, BSN, Scientist, clinical (histocompatibility and immunogenetics) Only

## 2024-02-09 NOTE — Telephone Encounter (Signed)
 Patient Product/process development scientist completed.    The patient is insured through [facility]. Patient has [employer], may use a copay card, and/or apply for patient assistance if available.    Ran test claim for Eliquis  5 mg and the current 30 day co-pay is $0.00.  Ran test claim for Farxiga  10 mg and the current 30 day co-pay is $0.00.  Ran test claim for sacubitril -valsartan  (Entresto ) 24-26 mg and the current 30 day co-pay is $0.00.  This test claim was processed through [facility]- copay amounts may vary at other pharmacies due to pharmacy/plan contracts, or as the patient moves through the different stages of their insurance plan.     Reyes Sharps, CPHT Pharmacy Technician III Certified Patient Advocate [facility] Patient Advocate Team Direct Number: [phone]  Fax: [phone]

## 2024-02-09 NOTE — Progress Notes (Addendum)
 PHARMACY - ANTICOAGULATION CONSULT NOTE  Pharmacy Consult for Heparin  Indication: pulmonary embolus  No Known Allergies  Patient Measurements: Ht 6'6 Wt ~100 kg Height: 6' 6 (198.1 cm) Weight: 103.5 kg (228 lb 2.8 oz) IBW/kg (Calculated) : 91.4 HEPARIN  DW (KG): 104.3  Vital Signs: Temp: 98.7 F (37.1 C) (09/16 0750) Temp Source: Oral (09/16 0750) BP: 121/86 (09/16 0750) Pulse Rate: 120 (09/16 0750)  Labs: Recent Labs    02/08/24 0551 02/08/24 0625 02/08/24 0753 02/08/24 2106 02/09/24 0305  HGB 13.2  --   --   --  12.7*  HCT 39.7  --   --   --  37.9*  PLT 410*  --   --   --  408*  APTT  --   --   --  52* 103*  LABPROT  --  16.5*  --   --   --   INR  --  1.3*  --   --   --   HEPARINUNFRC  --   --   --  0.27* 0.61  CREATININE 1.12  --   --   --  0.99  TROPONINIHS 74*  --  65*  --   --     Estimated Creatinine Clearance: 114.1 mL/min (by C-G formula based on SCr of 0.99 mg/dL).   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 M presents with SOB, CP, hemoptysis. Noted pt on apixaban  pta for h/o PE/DVT (Feb 2025)- last dose 9/14 2030 per patient. Pt reports compliance. CT shows new acute occlusive PE within the left lower lob segmental pulmonary artery supplying the medial basilar segment. Plan to start heparin  gtt.   Initial aPTT and heparin  level correlating raising concern for compliance with apixaban .  Will discuss with patient.  Heparin  level and aPTT within goal on heparin  at 2000 units/hr.  Will repeat heparin  level in 6 hours for confirmation.  Goal of Therapy:  Heparin  level 0.3-0.7 units/ml aPTT 66-102 seconds Monitor platelets by anticoagulation protocol: Yes   Plan: Continue heparin  gtt at 2000 units/hr Heparin  level  in 6 hours Daily heparin  level and CBC   Jaquetta Currier, Pharm.D., BCPS Clinical Pharmacist Clinical phone for 02/09/2024 from 7:30-3:00 is [phone].  **Pharmacist phone directory can be found on [email or web address] listed under [facility].  02/09/2024 8:44 AM    Addendum:  Repeat heparin  level remains therapeutic.  No change.  Follow-up with labs in the AM.  Suzen Lovelace, Pharm.D., BCPS Clinical Pharmacist  02/09/2024 12:59 PM

## 2024-02-09 NOTE — Assessment & Plan Note (Addendum)
 History of seizures - appears to have been remote in the setting of traumatic brain bleed, not currently on any medications HLD - continue home rosuvastatin  10 mg Drug abuse history - continue home folic acid  1 mg daily and multivitamin with minerals Anxiety - continue home Atarax  10 mg 3 times daily as needed Constipation - continue home MiraLAX  17 g packet daily

## 2024-02-09 NOTE — Progress Notes (Addendum)
 Daily Progress Note Intern Pager: [phone]  Patient name: Anthony Singleton Medical record number: [id number] Date of birth: 11-28-1970 Age: 53 y.o. Gender: male  Primary Care Provider: Pcp, No Consultants: PCCM  Code Status: Full code  Pt Overview and Major Events to Date:  9/15 : Admitted for occlusive PE  Assessment Anthony Singleton is a 53 y.o. male presenting with SOB and hemoptysis and found to have acute PE and now RV thrombus.  Patient c/o new onset of left frank pain 10/10 pain and left shoulder pain. EKG w/ no significant change - remain tachycardia in 130. Trending down trp and Lactic acid. No change in CXR. His pain was improved after IV Dilaudid .  Assessment & Plan Pulmonary embolism [facility]) Patient present w/ Tachycardic, tachypneic. CTA PE with acute PE in LLL with airspace opacity consistent with pulmonary infarction versus focal alveolar hemorrhage.   - Continue progressive status  - Continue heparin  infusion - Pending DVT US  bilateral   - Pain regimen Tylenol  650 mg every 6 hours as needed Oxycodone  5 mg every 6 hours as needed for moderate pain  Oxycodone  10 mg every 6 hours as needed for severe pain Dilaudid  for severe pain  HFrEF (heart failure with reduced ejection fraction) (HCC) Echo on 07/15/2023 showed EF of 20 to 25%.  BNP of 2277.9 c/o RV dysfunction - Appreciate Advanced heart failure consult and care  - PCCM consulted, pending recommendation  - Echo shows RV thrombus  - Continue home Farxiga  10 mg daily, digoxin  0.125 mg daily - Hold home Entresto , beta-blocker, and spironolactone   Hypertension Elevated in the hospital secondary to pain and PE. 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 Barriers include clinical improvement.   Subjective:  Anthony Singleton is a 53 y.o c/o left shoulder and left frank pain. States pain 10/10 sharp pain that started in the lower back. Per patient pain starting suddenly around 9am this morning. Unable to lay down flat on the bed.  Objective: Temp:  [98.4 F (36.9 C)-98.9 F (37.2 C)] 98.7 F (37.1 C) (09/16 0750) Pulse Rate:  [120-136] 120 (09/16 0750) Resp:  [14-34] 15 (09/16 0933) BP: (103-136)/(75-105) 103/83 (09/16 0933) SpO2:  [94 %-100 %] 94 % (09/16 0750) Weight:  [103.5 kg-104.3 kg] 103.5 kg (09/16 0500) Physical Exam: Physical Exam Cardiovascular:     Rate and Rhythm: Tachycardia present.  Pulmonary:     Effort: Pulmonary effort is normal.     Breath sounds: Examination of the right-lower field reveals decreased breath sounds. Examination of the left-lower field reveals decreased breath sounds. Decreased breath sounds present.  Abdominal:     Palpations: Abdomen is soft.     Tenderness: There is no abdominal tenderness (Left frank pain).     Comments: Last BM 02/08/24  Musculoskeletal:     Right lower leg: Edema present.     Left lower leg: Edema present.  Skin:    Capillary Refill: Capillary refill takes less than 2 seconds.  Neurological:     General: No focal deficit present.  Mental Status: He is oriented to person, place, and time.     Laboratory: Most recent CBC Lab Results  Component Value Date   WBC 11.3 (H) 02/09/2024   HGB 12.7 (L) 02/09/2024   HCT 37.5 (L) 02/09/2024   MCV 98.4 02/09/2024   PLT 425 (H) 02/09/2024   Most recent BMP    Latest Ref Rng & Units 02/09/2024    3:05 AM  BMP  Glucose 70 - 99 mg/dL 876   BUN 6 - 20 mg/dL 17   Creatinine 9.38 - 1.24 mg/dL 9.00   Sodium 864 - 854 mmol/L 134   Potassium  3.5 - 5.1 mmol/L 4.6   Chloride 98 - 111 mmol/L 103   CO2 22 - 32 mmol/L 20   Calcium  8.9 - 10.3 mg/dL 8.6      Imaging/Diagnostic Tests: CLINI CAL DATA:  Pulmonary embolism.  Chest pain.   EXAM: PORTABLE CHEST 1 VIEW IMPRESSION: 1. Cardiomegaly with pulmonary vascular congestion. 2. Similar small left pleural effusion. 3. Redemonstrated retrocardiac opacity corresponding to left medial basilar airspace opacity, concerning for pulmonary infarction versus focal alveolar hemorrhage, better evaluated on the CTA chest.    Suzen Elder B, DO 02/09/2024, 10:35 AM  [email or web address], [facility] FPTS Intern pager: [phone], text pages welcome Secure chat group [facility] Teaching Service

## 2024-02-09 NOTE — Progress Notes (Signed)
 Echocardiogram 2D Echocardiogram has been performed.  Damien FALCON Zia Kanner RDCS 02/09/2024, 10:52 AM  Notified Dr Gardenia of critical results

## 2024-02-10 ENCOUNTER — Inpatient Hospital Stay (HOSPITAL_COMMUNITY)

## 2024-02-10 DIAGNOSIS — M7989 Other specified soft tissue disorders: Secondary | ICD-10-CM | POA: Diagnosis not present

## 2024-02-10 DIAGNOSIS — I5082 Biventricular heart failure: Secondary | ICD-10-CM | POA: Diagnosis not present

## 2024-02-10 DIAGNOSIS — I2699 Other pulmonary embolism without acute cor pulmonale: Secondary | ICD-10-CM | POA: Diagnosis not present

## 2024-02-10 LAB — HEPARIN LEVEL (UNFRACTIONATED)
Heparin Unfractionated: 0.24 [IU]/mL — ABNORMAL LOW (ref 0.30–0.70)
Heparin Unfractionated: 0.19 [IU]/mL — ABNORMAL LOW (ref 0.30–0.70)
Heparin Unfractionated: 0.29 [IU]/mL — ABNORMAL LOW (ref 0.30–0.70)

## 2024-02-10 LAB — CBC
HCT: 38.4 % — ABNORMAL LOW (ref 39.0–52.0)
Hemoglobin: 12.7 g/dL — ABNORMAL LOW (ref 13.0–17.0)
MCH: 33.1 pg (ref 26.0–34.0)
MCHC: 33.1 g/dL (ref 30.0–36.0)
MCV: 100 fL (ref 80.0–100.0)
Platelets: 383 K/uL (ref 150–400)
RBC: 3.84 MIL/uL — ABNORMAL LOW (ref 4.22–5.81)
RDW: 13 % (ref 11.5–15.5)
WBC: 8.4 K/uL (ref 4.0–10.5)
nRBC: 0 % (ref 0.0–0.2)

## 2024-02-10 LAB — BASIC METABOLIC PANEL WITH GFR
Anion gap: 13 (ref 5–15)
BUN: 27 mg/dL — ABNORMAL HIGH (ref 6–20)
CO2: 24 mmol/L (ref 22–32)
Calcium: 8.4 mg/dL — ABNORMAL LOW (ref 8.9–10.3)
Chloride: 97 mmol/L — ABNORMAL LOW (ref 98–111)
Creatinine, Ser: 1.17 mg/dL (ref 0.61–1.24)
GFR, Estimated: >60 mL/min (ref >60)
Glucose, Bld: 160 mg/dL — ABNORMAL HIGH (ref 70–99)
Potassium: 4.2 mmol/L (ref 3.5–5.1)
Sodium: 134 mmol/L — ABNORMAL LOW (ref 135–145)

## 2024-02-10 LAB — COOXEMETRY PANEL
Carboxyhemoglobin: 1.3 % (ref 0.5–1.5)
Methemoglobin: <0.7 % (ref 0.0–1.5)
O2 Saturation: 75.8 %
Total hemoglobin: 13.5 g/dL (ref 12.0–16.0)

## 2024-02-10 LAB — DIGOXIN LEVEL: Digoxin Level: <0.6 ng/mL — ABNORMAL LOW (ref 0.8–2.0)

## 2024-02-10 LAB — MAGNESIUM: Magnesium: 2 mg/dL (ref 1.7–2.4)

## 2024-02-10 MED ORDER — PROMETHAZINE HCL 6.25 MG/5ML PO SOLN
6.2500 mg | Freq: Three times a day (TID) | ORAL | Status: DC | PRN
  Administered 2024-02-10 – 2024-02-11 (×2): 6.25 mg via ORAL
  Filled 2024-02-10 (×6): qty 5

## 2024-02-10 MED ORDER — ACETAMINOPHEN-CODEINE 120-12 MG/5ML PO SOLN
5.0000 mL | Freq: Four times a day (QID) | ORAL | Status: DC | PRN
  Administered 2024-02-10 – 2024-02-13 (×7): 5 mL via ORAL
  Filled 2024-02-10 (×7): qty 5

## 2024-02-10 MED ORDER — DEXTROMETHORPHAN POLISTIREX ER 30 MG/5ML PO SUER
30.0000 mg | Freq: Every evening | ORAL | Status: DC | PRN
  Filled 2024-02-10: qty 5

## 2024-02-10 NOTE — Progress Notes (Signed)
 PHARMACY - ANTICOAGULATION CONSULT NOTE  Pharmacy Consult for Heparin  Indication: pulmonary embolus  No Known Allergies  Patient Measurements: Ht 6'6 Wt ~100 kg Height: 6' 6 (198.1 cm) Weight: 103.6 kg (228 lb 6.3 oz) IBW/kg (Calculated) : 91.4 HEPARIN  DW (KG): 104.3  Vital Signs: Temp: 99.2 F (37.3 C) (09/17 1524) Temp Source: Oral (09/17 1524) BP: 104/86 (09/17 1524) Pulse Rate: 119 (09/17 1524)  Labs: Recent Labs    02/08/24 0551 02/08/24 0551 02/08/24 0625 02/08/24 0753 02/08/24 2106 02/09/24 0305 02/09/24 0929 02/09/24 1206 02/10/24 0455 02/10/24 1400  HGB 13.2  --   --   --   --  12.7* 12.7*  --  12.7*  --   HCT 39.7  --   --   --   --  37.9* 37.5*  --  38.4*  --   PLT 410*  --   --   --   --  408* 425*  --  383  --   APTT  --   --   --   --  52* 103*  --   --   --   --   LABPROT  --   --  16.5*  --   --   --   --   --   --   --   INR  --   --  1.3*  --   --   --   --   --   --   --   HEPARINUNFRC  --    < >  --   --  0.27* 0.61  --  0.45 0.24* 0.19*  CREATININE 1.12  --   --   --   --  0.99  --   --  1.17  --   TROPONINIHS 74*  --   --  65*  --   --  58* 53*  --   --    < > = values in this interval not displayed.    Estimated Creatinine Clearance: 96.6 mL/min (by C-G formula based on SCr of 1.17 mg/dL).   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 M presents with SOB, CP, hemoptysis. Noted pt on apixaban  pta for h/o PE/DVT (Feb 2025)- last dose 9/14 2030 per patient. Pt reports compliance. CT shows new acute occlusive PE within the left lower lob segmental pulmonary artery supplying the medial basilar segment. Plan to start heparin  gtt.   Initial aPTT and heparin  level correlating raising concern for compliance with  apixaban .  Will discuss with patient.  Heparin  level 0.24 below goal on 2000 units/hr despite being previously therapeutic x2 (HL 0.61, HL 0.45) on this rate.  Confirmed with RN no infusion issues/interruptions and line wasn't kinked.  CBC stable - Hgb 12.7, pltc 383.   9/17 evening update: heparin  level subtherapeutic again at 0.19, will increase rate. Goal of Therapy:  Heparin  level 0.3-0.7 units/ml Monitor platelets by anticoagulation protocol: Yes   Plan: Increase heparin  gtt to 2300 units/hr Heparin  level in 6 hours Daily heparin  level and CBC  Larraine Brazier, PharmD Clinical Pharmacist 02/10/2024  3:29 PM **Pharmacist phone directory can now be found on [email or web address] ([email or web address]).  Listed under [facility].

## 2024-02-10 NOTE — Plan of Care (Signed)

## 2024-02-10 NOTE — Progress Notes (Signed)
 PHARMACY - ANTICOAGULATION CONSULT NOTE  Pharmacy Consult for Heparin  Indication: pulmonary embolus  No Known Allergies  Patient Measurements: Ht 6'6 Wt ~100 kg Height: 6' 6 (198.1 cm) Weight: 103.5 kg (228 lb 2.8 oz) IBW/kg (Calculated) : 91.4 HEPARIN  DW (KG): 104.3  Vital Signs: Temp: 98.7 F (37.1 C) (09/17 0325) Temp Source: Oral (09/17 0325) BP: 106/82 (09/17 0700) Pulse Rate: 124 (09/17 0700)  Labs: Recent Labs    02/08/24 0551 02/08/24 0551 02/08/24 0625 02/08/24 0753 02/08/24 2106 02/09/24 0305 02/09/24 0929 02/09/24 1206 02/10/24 0455  HGB 13.2  --   --   --   --  12.7* 12.7*  --  12.7*  HCT 39.7  --   --   --   --  37.9* 37.5*  --  38.4*  PLT 410*  --   --   --   --  408* 425*  --  383  APTT  --   --   --   --  52* 103*  --   --   --   LABPROT  --   --  16.5*  --   --   --   --   --   --   INR  --   --  1.3*  --   --   --   --   --   --   HEPARINUNFRC  --    < >  --   --  0.27* 0.61  --  0.45 0.24*  CREATININE 1.12  --   --   --   --  0.99  --   --  1.17  TROPONINIHS 74*  --   --  65*  --   --  58* 53*  --    < > = values in this interval not displayed.    Estimated Creatinine Clearance: 96.6 mL/min (by C-G formula based on SCr of 1.17 mg/dL).   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 M presents with SOB, CP, hemoptysis. Noted pt on apixaban  pta for h/o PE/DVT (Feb 2025)- last dose 9/14 2030 per patient. Pt reports compliance. CT shows new acute occlusive PE within the left lower lob segmental pulmonary artery supplying the medial basilar segment. Plan to start heparin  gtt.   Initial aPTT and heparin  level correlating raising concern for compliance with apixaban .  Will discuss with patient.  Heparin  level 0.24  below goal on 2000 units/hr despite being previously therapeutic x2 (HL 0.61, HL 0.45) on this rate.  Confirmed with RN no infusion issues/interruptions and line wasn't kinked.  CBC stable - Hgb 12.7, pltc 383.   Goal of Therapy:  Heparin  level 0.3-0.7 units/ml Monitor platelets by anticoagulation protocol: Yes   Plan: Increase heparin  gtt at 2150 units/hr Heparin  level in 6 hours Daily heparin  level and CBC  Maurilio Fila, PharmD Clinical Pharmacist 02/10/2024  7:33 AM

## 2024-02-10 NOTE — Progress Notes (Signed)
 PHARMACY - ANTICOAGULATION CONSULT NOTE  Pharmacy Consult for Heparin  Indication: pulmonary embolus  No Known Allergies  Patient Measurements: Ht 6'6 Wt ~100 kg Height: 6' 6 (198.1 cm) Weight: 103.6 kg (228 lb 6.3 oz) IBW/kg (Calculated) : 91.4 HEPARIN  DW (KG): 104.3  Vital Signs: Temp: 98.2 F (36.8 C) (09/17 2130) Temp Source: Oral (09/17 2130) BP: 113/88 (09/17 2130) Pulse Rate: 128 (09/17 2130)  Labs: Recent Labs    02/08/24 0551 02/08/24 0551 02/08/24 0625 02/08/24 0753 02/08/24 2106 02/09/24 0305 02/09/24 0929 02/09/24 1206 02/10/24 0455 02/10/24 1400 02/10/24 2207  HGB 13.2  --   --   --   --  12.7* 12.7*  --  12.7*  --   --   HCT 39.7  --   --   --   --  37.9* 37.5*  --  38.4*  --   --   PLT 410*  --   --   --   --  408* 425*  --  383  --   --   APTT  --   --   --   --  52* 103*  --   --   --   --   --   LABPROT  --   --  16.5*  --   --   --   --   --   --   --   --   INR  --   --  1.3*  --   --   --   --   --   --   --   --   HEPARINUNFRC  --    < >  --   --  0.27* 0.61  --  0.45 0.24* 0.19* 0.29*  CREATININE 1.12  --   --   --   --  0.99  --   --  1.17  --   --   TROPONINIHS 74*  --   --  65*  --   --  58* 53*  --   --   --    < > = values in this interval not displayed.    Estimated Creatinine Clearance: 96.6 mL/min (by C-G formula based on SCr of 1.17 mg/dL).   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 M presents with SOB, CP, hemoptysis. Noted pt on apixaban  pta for h/o PE/DVT (Feb 2025)- last dose 9/14 2030 per patient. Pt reports compliance. CT shows new acute occlusive PE within the left lower lob segmental pulmonary artery supplying the medial basilar segment. Plan to start heparin  gtt.   Initial aPTT and  heparin  level correlating raising concern for compliance with apixaban .  Will discuss with patient.  Heparin  level 0.24 below goal on 2000 units/hr despite being previously therapeutic x2 (HL 0.61, HL 0.45) on this rate.  Confirmed with RN no infusion issues/interruptions and line wasn't kinked.  CBC stable - Hgb 12.7, pltc 383.   9/17 night update: heparin  level barely subtherapeutic  at 0.29, will increase rate. Goal of Therapy:  Heparin  level 0.3-0.7 units/ml Monitor platelets by anticoagulation protocol: Yes   Plan: Increase heparin  gtt to 2400 units/hr Heparin  level in 8 hours Daily heparin  level and  CBC  Larraine Brazier, PharmD Clinical Pharmacist 02/10/2024  10:34 PM **Pharmacist phone directory can now be found on [email or web address] ([email or web address]).  Listed under [facility].
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
## 2024-02-10 NOTE — Progress Notes (Signed)
 Bilateral upper and lower extremity venous duplexes are completed.  Results can be found in chart review under CV Proc.  02/10/2024 11:54 AM  Jeri Rawlins Elden Appl, RVT.
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
## 2024-02-11 ENCOUNTER — Encounter (HOSPITAL_COMMUNITY): Payer: Self-pay

## 2024-02-11 DIAGNOSIS — I5082 Biventricular heart failure: Secondary | ICD-10-CM | POA: Diagnosis not present

## 2024-02-11 LAB — PROTIME-INR
INR: 1.3 — ABNORMAL HIGH (ref 0.8–1.2)
Prothrombin Time: 17.2 s — ABNORMAL HIGH (ref 11.4–15.2)

## 2024-02-11 LAB — COOXEMETRY PANEL
Carboxyhemoglobin: 2.5 % — ABNORMAL HIGH (ref 0.5–1.5)
Methemoglobin: 0.7 % (ref 0.0–1.5)
O2 Saturation: 85.3 %
Total hemoglobin: 13.2 g/dL (ref 12.0–16.0)

## 2024-02-11 LAB — HEPARIN LEVEL (UNFRACTIONATED)
Heparin Unfractionated: 0.36 [IU]/mL (ref 0.30–0.70)
Heparin Unfractionated: 0.23 [IU]/mL — ABNORMAL LOW (ref 0.30–0.70)

## 2024-02-11 LAB — CBC
HCT: 37 % — ABNORMAL LOW (ref 39.0–52.0)
Hemoglobin: 12.3 g/dL — ABNORMAL LOW (ref 13.0–17.0)
MCH: 32.9 pg (ref 26.0–34.0)
MCHC: 33.2 g/dL (ref 30.0–36.0)
MCV: 98.9 fL (ref 80.0–100.0)
Platelets: 366 K/uL (ref 150–400)
RBC: 3.74 MIL/uL — ABNORMAL LOW (ref 4.22–5.81)
RDW: 13 % (ref 11.5–15.5)
WBC: 8.6 K/uL (ref 4.0–10.5)
nRBC: 0 % (ref 0.0–0.2)

## 2024-02-11 LAB — BASIC METABOLIC PANEL WITH GFR
Anion gap: 13 (ref 5–15)
BUN: 22 mg/dL — ABNORMAL HIGH (ref 6–20)
CO2: 23 mmol/L (ref 22–32)
Calcium: 8.1 mg/dL — ABNORMAL LOW (ref 8.9–10.3)
Chloride: 101 mmol/L (ref 98–111)
Creatinine, Ser: 1.12 mg/dL (ref 0.61–1.24)
GFR, Estimated: >60 mL/min (ref >60)
Glucose, Bld: 141 mg/dL — ABNORMAL HIGH (ref 70–99)
Potassium: 4.1 mmol/L (ref 3.5–5.1)
Sodium: 137 mmol/L (ref 135–145)

## 2024-02-11 LAB — MAGNESIUM: Magnesium: 2.1 mg/dL (ref 1.7–2.4)

## 2024-02-11 MED ORDER — SPIRONOLACTONE 25 MG PO TABS
25.0000 mg | ORAL_TABLET | Freq: Every day | ORAL | Status: DC
  Administered 2024-02-11 – 2024-02-15 (×5): 25 mg via ORAL
  Filled 2024-02-11 (×5): qty 1

## 2024-02-11 MED ORDER — ENOXAPARIN SODIUM 100 MG/ML IJ SOSY
100.0000 mg | PREFILLED_SYRINGE | Freq: Two times a day (BID) | INTRAMUSCULAR | Status: DC
Start: 2024-02-11 — End: 2024-02-15
  Administered 2024-02-11 – 2024-02-15 (×9): 100 mg via SUBCUTANEOUS
  Filled 2024-02-11 (×10): qty 1

## 2024-02-11 MED ORDER — WARFARIN SODIUM 7.5 MG PO TABS
7.5000 mg | ORAL_TABLET | Freq: Once | ORAL | Status: AC
  Administered 2024-02-11: 7.5 mg via ORAL
  Filled 2024-02-11: qty 1

## 2024-02-11 MED ORDER — HYDROMORPHONE HCL 1 MG/ML IJ SOLN
1.0000 mg | Freq: Four times a day (QID) | INTRAMUSCULAR | Status: DC | PRN
  Administered 2024-02-11: 1 mg via INTRAVENOUS
  Filled 2024-02-11 (×2): qty 1

## 2024-02-11 MED ORDER — WARFARIN - PHARMACIST DOSING INPATIENT: Freq: Every day | Status: DC

## 2024-02-11 NOTE — Discharge Instructions (Addendum)
 Dear Anthony Singleton,  Thank you for letting us  participate in your care. You were hospitalized for Pulmonary Embolism and diagnosed with Pulmonary embolism and infarction (HCC). You were treated with anticoagulant.   POST-HOSPITAL & CARE INSTRUCTIONS Please continue to to take Warfarin as prescribed  Go to your follow up appointments (listed below)  DOCTOR'S APPOINTMENT   Future Appointments  Date Time Provider Department Center  02/23/2024 10:30 AM [facility]  [facility] None  02/24/2024  9:30 AM [facility] PA/NP [facility] None  02/25/2024 11:00 AM Sirivol, Mamatha, MD [facility]    Follow-up Information     [facility] Follow up on 02/24/2024.   Specialty: Cardiology Why: at 930 Contact information: [address] [place]  [phone]        [facility] Follow up on 02/25/2024.   Why: Dr,Sirivol,Mamatha 02/25/2024 @11 :00am,please arrive @10 :45am. for your 11:00 am appointment. Contact information: [address] [place] [place]  [address] [phone]                Take care and be well!  Family Medicine Teaching Service Inpatient Team [place]  [facility]  [address] [place], [place] [address] [phone]       Information on my medicine - Coumadin    (Warfarin)  This medication education was reviewed with me or my healthcare representative as part of my discharge preparation.  The pharmacist that spoke with me during my hospital stay was:  Maurilio RAYMOND Fila, RPH  Why was Coumadin  prescribed for you? Coumadin  was prescribed for you because you have a blood clot or a medical condition that can cause an increased risk of forming blood clots. Blood clots can cause serious health problems by blocking the flow of blood to the heart, lung, or brain. Coumadin  can prevent harmful blood clots from forming. As a reminder your  indication for Coumadin  is:  Pulmonary Embolism treatment  What test will check on my response to Coumadin ? While on Coumadin  (warfarin) you will need to have an INR test regularly to ensure that your dose is keeping you in the desired range. The INR (international normalized ratio) number is calculated from the result of the laboratory test called prothrombin time (PT).  If an INR APPOINTMENT HAS NOT ALREADY BEEN MADE FOR YOU please schedule an appointment to have this lab work done by your health care provider within 7 days. Your INR goal is usually a number between:  2 to 3 or your provider may give you a more narrow range like 2-2.5.  Ask your health care provider during an office visit what your goal INR is.  What  do you need to  know  About  COUMADIN ? Take Coumadin  (warfarin) exactly as prescribed by your healthcare provider about the same time each day.  DO NOT stop taking without talking to the doctor who prescribed the medication.  Stopping without other blood clot prevention medication to take the place of Coumadin  may increase your risk of developing a new clot or stroke.  Get refills before you run out.  What do you do if you miss a dose? If you miss a dose, take it as soon as you remember on the same day then continue your regularly scheduled regimen the next day.  Do not take two doses of Coumadin  at the same time.  Important Safety Information A possible side effect of Coumadin  (Warfarin) is an increased  risk of bleeding. You should call your healthcare provider right away if you experience any of the following: Bleeding from an injury or your nose that does not stop. Unusual colored urine (red or dark brown) or unusual colored stools (red or black). Unusual bruising for unknown reasons. A serious fall or if you hit your head (even if there is no bleeding).  Some foods or medicines interact with Coumadin  (warfarin) and might alter your response to warfarin. To help avoid  this: Eat a balanced diet, maintaining a consistent amount of Vitamin K. Notify your provider about major diet changes you plan to make. Avoid alcohol or limit your intake to 1 drink for women and 2 drinks for men per day. (1 drink is 5 oz. wine, 12 oz. beer, or 1.5 oz. liquor.)  Make sure that ANY health care provider who prescribes medication for you knows that you are taking Coumadin  (warfarin).  Also make sure the healthcare provider who is monitoring your Coumadin  knows when you have started a new medication including herbals and non-prescription products.  Coumadin  (Warfarin)  Major Drug Interactions  Increased Warfarin Effect Decreased Warfarin Effect  Alcohol (large quantities) Antibiotics (esp. Septra/Bactrim, Flagyl, Cipro) Amiodarone (Cordarone) Aspirin  (ASA) Cimetidine (Tagamet) Megestrol (Megace) NSAIDs (ibuprofen, naproxen, etc.) Piroxicam (Feldene) Propafenone (Rythmol SR) Propranolol (Inderal) Isoniazid (INH) Posaconazole (Noxafil) Barbiturates (Phenobarbital) Carbamazepine (Tegretol) Chlordiazepoxide (Librium) Cholestyramine (Questran) Griseofulvin Oral Contraceptives Rifampin Sucralfate (Carafate) Vitamin K   Coumadin  (Warfarin) Major Herbal Interactions  Increased Warfarin Effect Decreased Warfarin Effect  Garlic Ginseng Ginkgo biloba Coenzyme Q10 Green tea St. John's wort    Coumadin  (Warfarin) FOOD Interactions  Eat a consistent number of servings per week of foods HIGH in Vitamin K (1 serving =  cup)  Collards (cooked, or boiled & drained) Kale (cooked, or boiled & drained) Mustard greens (cooked, or boiled & drained) Parsley *serving size only =  cup Spinach (cooked, or boiled & drained) Swiss chard (cooked, or boiled & drained) Turnip greens (cooked, or boiled & drained)  Eat a consistent number of servings per week of foods MEDIUM-HIGH in Vitamin K (1 serving = 1 cup)  Asparagus (cooked, or boiled & drained) Broccoli (cooked, boiled &  drained, or raw & chopped) Brussel sprouts (cooked, or boiled & drained) *serving size only =  cup Lettuce, raw (green leaf, endive, romaine) Spinach, raw Turnip greens, raw & chopped   These websites have more information on Coumadin  (warfarin):  www.coumadin .com; www.ahrq.gov/consumer/coumadin .htm;

## 2024-02-11 NOTE — Progress Notes (Addendum)
 PHARMACY - ANTICOAGULATION CONSULT NOTE  Pharmacy Consult for Heparin  >> lovenox  and warfarin Indication: pulmonary embolus  No Known Allergies  Patient Measurements: Ht 6'6 Wt ~100 kg Height: 6' 6 (198.1 cm) Weight: 102.1 kg (225 lb) IBW/kg (Calculated) : 91.4 HEPARIN  DW (KG): 104.3  Vital Signs: Temp: 98.5 F (36.9 C) (09/18 0422) Temp Source: Oral (09/18 0422) BP: 116/86 (09/17 2339) Pulse Rate: 117 (09/18 0422)  Labs: Recent Labs    02/08/24 0753 02/08/24 2106 02/08/24 2106 02/09/24 0305 02/09/24 0929 02/09/24 1206 02/10/24 0455 02/10/24 1400 02/10/24 2207 02/11/24 0431  HGB  --   --    < > 12.7* 12.7*  --  12.7*  --   --  12.3*  HCT  --   --    < > 37.9* 37.5*  --  38.4*  --   --  37.0*  PLT  --   --    < > 408* 425*  --  383  --   --  366  APTT  --  52*  --  103*  --   --   --   --   --   --   HEPARINUNFRC  --  0.27*   < > 0.61  --  0.45 0.24* 0.19* 0.29* 0.36  CREATININE  --   --   --  0.99  --   --  1.17  --   --  1.12  TROPONINIHS 65*  --   --   --  58* 53*  --   --   --   --    < > = values in this interval not displayed.    Estimated Creatinine Clearance: 100.9 mL/min (by C-G formula based on SCr of 1.12 mg/dL).   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 M presents with SOB, CP, hemoptysis. Noted pt on apixaban  pta for h/o PE/DVT (Feb 2025)- last dose 9/14 2030 per patient. Pt reports compliance. CT shows new acute occlusive PE within the left lower lob segmental pulmonary artery supplying the medial basilar segment. Plan to start heparin  gtt.   Initial aPTT and heparin  level correlating raising concern for compliance with apixaban .  Will discuss with patient.  Heparin  level 0.36 (~ 6 hour level) is therapeutic  with heparin  running at 2400 units/hr. Hgb (12.3) and PLTs (366) are stable. Per RN, no report of pauses, issues with the line, or signs of bleeding.   9/18 Update: confirmatory heparin  level returned to 0.23, which is now subtherapeutic. No changes in clinical status per RN. Discussed with AHF MD, will stop heparin  infusion and transition to therapeutic lovenox  for warfarin bridge until INR is therapeutic.   Goal of Therapy:  INR 2-3 Monitor platelets by anticoagulation protocol: Yes   Plan: Stop heparin  infusion Start lovenox  100 mg [place] q12h until INR therapeutic Warfarin 7.5 mg x1 today Daily INR and CBC  Thank you for allowing pharmacy to be a part of this patient's care.   Nidia Schaffer, PharmD PGY2 Cardiology Pharmacy Resident  Please check AMION for all [facility] Pharmacy phone numbers After 10:00 PM, call Main Pharmacy [phone] 02/11/2024  7:19 AM
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
## 2024-02-12 DIAGNOSIS — I82629 Acute embolism and thrombosis of deep veins of unspecified upper extremity: Secondary | ICD-10-CM

## 2024-02-12 DIAGNOSIS — I5082 Biventricular heart failure: Secondary | ICD-10-CM | POA: Diagnosis not present

## 2024-02-12 DIAGNOSIS — R059 Cough, unspecified: Secondary | ICD-10-CM | POA: Insufficient documentation

## 2024-02-12 DIAGNOSIS — I5020 Unspecified systolic (congestive) heart failure: Secondary | ICD-10-CM | POA: Diagnosis not present

## 2024-02-12 DIAGNOSIS — I82603 Acute embolism and thrombosis of unspecified veins of upper extremity, bilateral: Secondary | ICD-10-CM | POA: Insufficient documentation

## 2024-02-12 DIAGNOSIS — I2699 Other pulmonary embolism without acute cor pulmonale: Secondary | ICD-10-CM | POA: Diagnosis not present

## 2024-02-12 LAB — BASIC METABOLIC PANEL WITH GFR
Anion gap: 14 (ref 5–15)
BUN: 25 mg/dL — ABNORMAL HIGH (ref 6–20)
CO2: 23 mmol/L (ref 22–32)
Calcium: 8.7 mg/dL — ABNORMAL LOW (ref 8.9–10.3)
Chloride: 101 mmol/L (ref 98–111)
Creatinine, Ser: 0.84 mg/dL (ref 0.61–1.24)
GFR, Estimated: >60 mL/min (ref >60)
Glucose, Bld: 93 mg/dL (ref 70–99)
Potassium: 4.4 mmol/L (ref 3.5–5.1)
Sodium: 138 mmol/L (ref 135–145)

## 2024-02-12 LAB — PROTIME-INR
INR: 1.2 (ref 0.8–1.2)
Prothrombin Time: 15.9 s — ABNORMAL HIGH (ref 11.4–15.2)

## 2024-02-12 LAB — CBC
HCT: 38.6 % — ABNORMAL LOW (ref 39.0–52.0)
Hemoglobin: 12.8 g/dL — ABNORMAL LOW (ref 13.0–17.0)
MCH: 32.9 pg (ref 26.0–34.0)
MCHC: 33.2 g/dL (ref 30.0–36.0)
MCV: 99.2 fL (ref 80.0–100.0)
Platelets: 399 K/uL (ref 150–400)
RBC: 3.89 MIL/uL — ABNORMAL LOW (ref 4.22–5.81)
RDW: 13.2 % (ref 11.5–15.5)
WBC: 9 K/uL (ref 4.0–10.5)
nRBC: 0 % (ref 0.0–0.2)

## 2024-02-12 LAB — MAGNESIUM: Magnesium: 2.2 mg/dL (ref 1.7–2.4)

## 2024-02-12 LAB — COOXEMETRY PANEL
Carboxyhemoglobin: 2 % — ABNORMAL HIGH (ref 0.5–1.5)
Carboxyhemoglobin: 2.2 % — ABNORMAL HIGH (ref 0.5–1.5)
Methemoglobin: <0.7 % (ref 0.0–1.5)
Methemoglobin: <0.7 % (ref 0.0–1.5)
O2 Saturation: 49.6 %
O2 Saturation: 72.3 %
Total hemoglobin: 13.4 g/dL (ref 12.0–16.0)
Total hemoglobin: 12.1 g/dL (ref 12.0–16.0)

## 2024-02-12 MED ORDER — SENNOSIDES-DOCUSATE SODIUM 8.6-50 MG PO TABS: 1.0000 | ORAL_TABLET | Freq: Every evening | ORAL | Status: DC | PRN

## 2024-02-12 MED ORDER — WARFARIN SODIUM 7.5 MG PO TABS
7.5000 mg | ORAL_TABLET | Freq: Once | ORAL | Status: AC
  Administered 2024-02-12: 7.5 mg via ORAL
  Filled 2024-02-12: qty 1

## 2024-02-12 MED ORDER — LIDOCAINE 5 % EX PTCH
1.0000 | MEDICATED_PATCH | CUTANEOUS | Status: DC
  Administered 2024-02-12 – 2024-02-15 (×4): 1 via TRANSDERMAL
  Filled 2024-02-12 (×4): qty 1

## 2024-02-12 MED ORDER — POLYETHYLENE GLYCOL 3350 17 G PO PACK
17.0000 g | PACK | Freq: Every day | ORAL | Status: DC | PRN
  Filled 2024-02-12: qty 1

## 2024-02-12 MED ORDER — SACUBITRIL-VALSARTAN 24-26 MG PO TABS
1.0000 | ORAL_TABLET | Freq: Two times a day (BID) | ORAL | Status: DC
  Administered 2024-02-12 – 2024-02-13 (×4): 1 via ORAL
  Filled 2024-02-12 (×4): qty 1

## 2024-02-12 MED ORDER — GABAPENTIN 300 MG PO CAPS
300.0000 mg | ORAL_CAPSULE | Freq: Every day | ORAL | Status: DC
  Administered 2024-02-12 – 2024-02-14 (×3): 300 mg via ORAL
  Filled 2024-02-12 (×3): qty 1

## 2024-02-12 MED ORDER — HYDROMORPHONE HCL 1 MG/ML IJ SOLN: 1.0000 mg | Freq: Two times a day (BID) | INTRAMUSCULAR | Status: DC | PRN

## 2024-02-12 NOTE — Plan of Care (Signed)
  Problem: Education: Goal: Knowledge of General Education information will improve Description: Including pain rating scale, medication(s)/side effects and non-pharmacologic comfort measures Outcome: Progressing   Problem: Clinical Measurements: Goal: Ability to maintain clinical measurements within normal limits will improve Outcome: Progressing Goal: Respiratory complications will improve Outcome: Progressing Goal: Cardiovascular complication will be avoided Outcome: Progressing   Problem: Activity: Goal: Risk for activity intolerance will decrease Outcome: Progressing   Problem: Nutrition: Goal: Adequate nutrition will be maintained Outcome: Progressing   Problem: Coping: Goal: Level of anxiety will decrease Outcome: Progressing   Problem: Elimination: Goal: Will not experience complications related to urinary retention Outcome: Progressing   Problem: Pain Managment: Goal: General experience of comfort will improve and/or be controlled Outcome: Progressing   Problem: Skin Integrity: Goal: Risk for impaired skin integrity will decrease Outcome: Progressing
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
## 2024-02-12 NOTE — Progress Notes (Signed)
 Daily Progress Note Intern Pager: [phone]  Patient name: Anthony Singleton Medical record number: [id number] Date of birth: 29-Jun-1970 Age: 53 y.o. Gender: male  Primary Care Provider: Pcp, No Consultants: PCCM  Code Status: Full code  Pt Overview and Major Events to Date:  9/15 : Admitted for occlusive PE 9/16 : Transfer to Cardiac ICU and was placed on milrinone  gtt  Assessment Anthony Singleton is a 53 y.o. male presenting with SOB and hemoptysis and found to have acute PE and now RV thrombus. Patient with severe biventricular failure as well as recurrent segmental pulmonary embolism.  Assessment & Plan Pulmonary embolism (HCC) Biventricular failure (HCC) Patient present w/ Tachycardic, tachypneic. CTA PE with acute PE in LLL with airspace opacity consistent with pulmonary infarction versus focal alveolar hemorrhage.  Euvolemic on RA. Likely due to meth/cocaine abuse orTBI-induced inappropriate sinus tach per cardiology.  - Continue progressive status  - Continue warfarin ( starting on 9/18 today  PT 15.9 INR 1.2), managing by pharmacist  - PICC line, coox and CVP to assess for low output heart failure  - Pain regimen Tylenol  650 mg every 6 hours as needed Oxycodone  5 mg every 6 hours as needed for moderate pain  Oxycodone  10 mg every 6 hours as needed for severe pain Dilaudid  for severe pain 1 mg IV Q12H PRN Gabapentin  300 mg daily HFrEF (heart failure with reduced ejection fraction) (HCC) Echo on 07/15/2023 showed EF of 20 to 25%.  BNP of 2277.9 c/o RV dysfunction - Appreciate Advanced heart failure consult and care  - Continue home Farxiga  10 mg daily, digoxin  0.125 mg daily - Continue home Entresto  and spironolactone   - Hold home beta-blocker Hypertension Elevated in the hospital secondary to pain and PE. -Continuing GDMT as above Cough C/o ongoing cough - dextromethorphan  (DELSYM ) 30 MG/5ML liquid 30 mg  BID and at bedtime PRN Chronic health  problem History of seizures - appears to have been remote in the setting of traumatic brain bleed, not currently on any medications HLD - continue home rosuvastatin  10 mg Drug abuse history - continue home folic acid  1 mg daily and multivitamin with minerals Anxiety - continue home Atarax  10 mg 3 times daily as needed Constipation - continue home MiraLAX  17 g packet daily  FEN/GI: Heart diet  PPx: Therapeutic Lovenox  Dispo:pending in 2-3 days. Barriers include clinical improvement.   Subjective:  Anthony Singleton is a 53 y.o doing well this morning still c/o episodic left chest pain but otherwise doing well. OOB to bathroom for BM this morning. Denies HA, change in vision, palpitation, or abdominal pain  Objective: Temp:  [98 F (36.7 C)-99.3 F (37.4 C)] 99.2 F (37.3 C) (09/19 0906) Pulse Rate:  [112-126] 118 (09/19 0814) Resp:  [16-18] 18 (09/19 0814) BP: (115-136)/(93-97) 124/97 (09/19 0814) SpO2:  [94 %-100 %] 97 % (09/19 0814) Weight:  [101.4 kg] 101.4 kg (09/19 0454) Physical Exam: Physical Exam Cardiovascular:     Rate and Rhythm: Tachycardia present.  Pulmonary:     Effort: Pulmonary effort is normal.     Breath sounds: Examination of the right-lower field reveals decreased breath sounds. Examination of the left-lower field reveals decreased breath sounds. Decreased breath sounds present.  Abdominal:     Palpations: Abdomen is soft.     Tenderness: There is no abdominal tenderness (Left frank pain).     Comments: Last BM 02/12/24  Musculoskeletal:     Right lower leg: Edema present.     Left lower  leg: Edema present.  Skin:    Capillary Refill: Capillary refill takes less than 2 seconds.  Neurological:     General: No focal deficit present.     Mental Status: He is oriented to person, place, and time.     Laboratory: Most recent CBC Lab Results  Component Value Date   WBC 9.0 02/12/2024   HGB 12.8 (L) 02/12/2024   HCT 38.6 (L) 02/12/2024   MCV 99.2 02/12/2024    PLT 399 02/12/2024   Most recent BMP    Latest Ref Rng & Units 02/12/2024    4:33 AM  BMP  Glucose 70 - 99 mg/dL 93   BUN 6 - 20 mg/dL 25   Creatinine 9.38 - 1.24 mg/dL 9.15   Sodium 864 - 854 mmol/L 138   Potassium 3.5 - 5.1 mmol/L 4.4   Chloride 98 - 111 mmol/L 101   CO2 22 - 32 mmol/L 23   Calcium  8.9 - 10.3 mg/dL 8.7      Imaging/Diagnostic Tests: CLINI CAL DATA:  Pulmonary embolism.  Chest pain.   EXAM: PORTABLE CHEST 1 VIEW IMPRESSION: 1. Cardiomegaly with pulmonary vascular congestion. 2. Similar small left pleural effusion. 3. Redemonstrated retrocardiac opacity corresponding to left medial basilar airspace opacity, concerning for pulmonary infarction versus focal alveolar hemorrhage, better evaluated on the CTA chest.    Suzen Elder B, DO 02/12/2024, 10:34 AM  PGY-1, [facility] FPTS Intern pager: [phone], text pages welcome Secure chat group [facility] Teaching Service

## 2024-02-12 NOTE — Plan of Care (Signed)

## 2024-02-12 NOTE — Assessment & Plan Note (Signed)
 Elevated in the hospital secondary to pain and PE. -Continuing GDMT as above

## 2024-02-12 NOTE — Assessment & Plan Note (Signed)
 History of seizures - appears to have been remote in the setting of traumatic brain bleed, not currently on any medications HLD - continue home rosuvastatin  10 mg Drug abuse history - continue home folic acid  1 mg daily and multivitamin with minerals Anxiety - continue home Atarax  10 mg 3 times daily as needed Constipation - continue home MiraLAX  17 g packet daily

## 2024-02-12 NOTE — Assessment & Plan Note (Signed)
 Patient present w/ Tachycardic, tachypneic. CTA PE with acute PE in LLL with airspace opacity consistent with pulmonary infarction versus focal alveolar hemorrhage.  Euvolemic on RA. Likely due to meth/cocaine abuse orTBI-induced inappropriate sinus tach per cardiology.  - Continue progressive status  - Continue warfarin ( starting on 9/18 today  PT 15.9 INR 1.2), managing by pharmacist  - PICC line, coox and CVP to assess for low output heart failure  - Pain regimen Tylenol  650 mg every 6 hours as needed Oxycodone  5 mg every 6 hours as needed for moderate pain  Oxycodone  10 mg every 6 hours as needed for severe pain Dilaudid  for severe pain 1 mg IV Q12H PRN Gabapentin  300 mg daily

## 2024-02-12 NOTE — Assessment & Plan Note (Signed)
 Echo on 07/15/2023 showed EF of 20 to 25%.  BNP of 2277.9 c/o RV dysfunction - Appreciate Advanced heart failure consult and care  - Continue home Farxiga  10 mg daily, digoxin  0.125 mg daily - Continue home Entresto  and spironolactone   - Hold home beta-blocker
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
## 2024-02-12 NOTE — Progress Notes (Signed)
 PHARMACY - ANTICOAGULATION CONSULT NOTE  Pharmacy Consult for Heparin  >> lovenox  and warfarin Indication: pulmonary embolus  No Known Allergies  Patient Measurements: Ht 6'6 Wt ~100 kg Height: 6' 6 (198.1 cm) Weight: 101.4 kg (223 lb 9.6 oz) IBW/kg (Calculated) : 91.4 HEPARIN  DW (KG): 104.3  Vital Signs: Temp: 99.3 F (37.4 C) (09/18 2353) Temp Source: Oral (09/18 2353) BP: 136/96 (09/18 2353) Pulse Rate: 123 (09/18 2353)  Labs: Recent Labs    02/09/24 0929 02/09/24 0929 02/09/24 1206 02/10/24 0455 02/10/24 1400 02/10/24 2207 02/11/24 0431 02/11/24 1030 02/11/24 1402 02/12/24 0433  HGB 12.7*  --   --  12.7*  --   --  12.3*  --   --  12.8*  HCT 37.5*  --   --  38.4*  --   --  37.0*  --   --  38.6*  PLT 425*  --   --  383  --   --  366  --   --  399  LABPROT  --   --   --   --   --   --   --   --  17.2* 15.9*  INR  --   --   --   --   --   --   --   --  1.3* 1.2  HEPARINUNFRC  --    < > 0.45 0.24*   < > 0.29* 0.36 0.23*  --   --   CREATININE  --   --   --  1.17  --   --  1.12  --   --  0.84  TROPONINIHS 58*  --  53*  --   --   --   --   --   --   --    < > = values in this interval not displayed.    Estimated Creatinine Clearance: 134.5 mL/min (by C-G formula based on SCr of 0.84 mg/dL).   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 M presents with SOB, CP, hemoptysis. Noted pt on apixaban  pta for h/o PE/DVT (Feb 2025)- last dose 9/14 2030 per patient. Pt reports compliance. CT shows new acute occlusive PE within the left lower lob segmental pulmonary artery supplying the medial basilar segment.  INR today is subtherapeutic at 1.2, down from 1.3 yesterday after warfarin 7.5 mg x1. Hgb (12.8) and PLTs (399) are stable.Charted  eating 100% of meals, on digoxin  which may increase anticoagulant effect of warfarin.   Goal of Therapy:  INR 2-3 Monitor platelets by anticoagulation protocol: Yes   Plan: Warfarin 7.5 mg x1 again today Continue Lovenox  100 mg [place] q12h until INR therapeutic Daily INR and CBC  Thank you for allowing pharmacy to be a part of this patient's care.   Nidia Schaffer, PharmD PGY2 Cardiology Pharmacy Resident  Please check AMION for all [facility] Pharmacy phone numbers After 10:00 PM, call Main Pharmacy [phone] 02/12/2024  8:12 AM

## 2024-02-12 NOTE — Assessment & Plan Note (Signed)
 C/o ongoing cough - dextromethorphan  (DELSYM ) 30 MG/5ML liquid 30 mg  BID and at bedtime PRN

## 2024-02-13 ENCOUNTER — Inpatient Hospital Stay (HOSPITAL_COMMUNITY)

## 2024-02-13 DIAGNOSIS — I5020 Unspecified systolic (congestive) heart failure: Secondary | ICD-10-CM | POA: Diagnosis not present

## 2024-02-13 DIAGNOSIS — I5082 Biventricular heart failure: Secondary | ICD-10-CM | POA: Diagnosis not present

## 2024-02-13 DIAGNOSIS — D6851 Activated protein C resistance: Secondary | ICD-10-CM

## 2024-02-13 DIAGNOSIS — I2699 Other pulmonary embolism without acute cor pulmonale: Secondary | ICD-10-CM | POA: Diagnosis not present

## 2024-02-13 DIAGNOSIS — F1910 Other psychoactive substance abuse, uncomplicated: Secondary | ICD-10-CM

## 2024-02-13 DIAGNOSIS — I509 Heart failure, unspecified: Secondary | ICD-10-CM

## 2024-02-13 DIAGNOSIS — I82603 Acute embolism and thrombosis of unspecified veins of upper extremity, bilateral: Secondary | ICD-10-CM | POA: Diagnosis not present

## 2024-02-13 LAB — BASIC METABOLIC PANEL WITH GFR
Anion gap: 11 (ref 5–15)
BUN: 21 mg/dL — ABNORMAL HIGH (ref 6–20)
CO2: 22 mmol/L (ref 22–32)
Calcium: 8.5 mg/dL — ABNORMAL LOW (ref 8.9–10.3)
Chloride: 101 mmol/L (ref 98–111)
Creatinine, Ser: 0.88 mg/dL (ref 0.61–1.24)
GFR, Estimated: >60 mL/min (ref >60)
Glucose, Bld: 129 mg/dL — ABNORMAL HIGH (ref 70–99)
Potassium: 4.7 mmol/L (ref 3.5–5.1)
Sodium: 134 mmol/L — ABNORMAL LOW (ref 135–145)

## 2024-02-13 LAB — CBC
HCT: 36.7 % — ABNORMAL LOW (ref 39.0–52.0)
Hemoglobin: 12.2 g/dL — ABNORMAL LOW (ref 13.0–17.0)
MCH: 32.8 pg (ref 26.0–34.0)
MCHC: 33.2 g/dL (ref 30.0–36.0)
MCV: 98.7 fL (ref 80.0–100.0)
Platelets: 400 K/uL (ref 150–400)
RBC: 3.72 MIL/uL — ABNORMAL LOW (ref 4.22–5.81)
RDW: 13.2 % (ref 11.5–15.5)
WBC: 9.5 K/uL (ref 4.0–10.5)
nRBC: 0 % (ref 0.0–0.2)

## 2024-02-13 LAB — PROTIME-INR
INR: 1.3 — ABNORMAL HIGH (ref 0.8–1.2)
Prothrombin Time: 17 s — ABNORMAL HIGH (ref 11.4–15.2)

## 2024-02-13 LAB — MAGNESIUM: Magnesium: 2.1 mg/dL (ref 1.7–2.4)

## 2024-02-13 MED ORDER — OXYCODONE HCL 5 MG PO TABS
10.0000 mg | ORAL_TABLET | Freq: Four times a day (QID) | ORAL | Status: DC
  Administered 2024-02-13 – 2024-02-14 (×4): 10 mg via ORAL
  Filled 2024-02-13 (×4): qty 2

## 2024-02-13 MED ORDER — WARFARIN SODIUM 5 MG PO TABS
10.0000 mg | ORAL_TABLET | Freq: Once | ORAL | Status: AC
  Administered 2024-02-13: 10 mg via ORAL
  Filled 2024-02-13: qty 2

## 2024-02-13 MED ORDER — SENNOSIDES-DOCUSATE SODIUM 8.6-50 MG PO TABS
2.0000 | ORAL_TABLET | Freq: Every day | ORAL | Status: DC
  Filled 2024-02-13 (×2): qty 2

## 2024-02-13 MED ORDER — DEXTROMETHORPHAN POLISTIREX ER 30 MG/5ML PO SUER
30.0000 mg | Freq: Three times a day (TID) | ORAL | Status: DC
  Administered 2024-02-13 – 2024-02-15 (×6): 30 mg via ORAL
  Filled 2024-02-13 (×7): qty 5

## 2024-02-13 MED ORDER — ONDANSETRON HCL 4 MG/2ML IJ SOLN
4.0000 mg | Freq: Four times a day (QID) | INTRAMUSCULAR | Status: DC
  Administered 2024-02-13 – 2024-02-15 (×9): 4 mg via INTRAVENOUS
  Filled 2024-02-13 (×9): qty 2

## 2024-02-13 MED ORDER — BENZONATATE 100 MG PO CAPS
100.0000 mg | ORAL_CAPSULE | Freq: Two times a day (BID) | ORAL | Status: DC | PRN
  Administered 2024-02-13 – 2024-02-15 (×3): 100 mg via ORAL
  Filled 2024-02-13 (×3): qty 1

## 2024-02-13 MED ORDER — POLYETHYLENE GLYCOL 3350 17 G PO PACK
17.0000 g | PACK | Freq: Every day | ORAL | Status: DC
  Filled 2024-02-13: qty 1

## 2024-02-13 MED ORDER — PANTOPRAZOLE SODIUM 40 MG PO TBEC
40.0000 mg | DELAYED_RELEASE_TABLET | Freq: Every day | ORAL | Status: DC
Start: 2024-02-13 — End: 2024-02-15
  Administered 2024-02-13 – 2024-02-15 (×3): 40 mg via ORAL
  Filled 2024-02-13 (×3): qty 1

## 2024-02-13 NOTE — Assessment & Plan Note (Addendum)
-   PICC line, coox and CVP to assess for low output heart failure  - Anticoagulation with warfarin (started 9/18); pharmacy titrating to goal INR of 2-3 - Bridged with Lovenox  - To follow with Coumadin  Clinic in [place] at discharge - Pain regimen: - Tylenol  650 mg q6h scheduled, Gabapentin  300 mg daily at bedtime, Lidocaine  patch daily - Scheduled oxycodone  10 mg q6h - Stopped prn oxy 5mg  (pt had not been requesting this - Fall and seizure precautions in place - PT/OT eval placed

## 2024-02-13 NOTE — Progress Notes (Signed)
 OT Cancellation Note  Patient Details Name: Anthony Singleton MRN: [id number] DOB: 1971-05-22   Cancelled Treatment:    Reason Eval/Treat Not Completed: OT screened, no needs identified, will sign off. Per secure chat with PT, pt ind with ADLs, no OT related concerns. OT is signing off on this pt.  Sharrie Self C, OT  Acute Rehabilitation Services Office [phone] Secure chat preferred   Adrianne GORMAN Savers 02/13/2024, 2:27 PM

## 2024-02-13 NOTE — Progress Notes (Signed)
 PHARMACY - ANTICOAGULATION CONSULT NOTE  Pharmacy Consult for Heparin  >> lovenox  and warfarin Indication: pulmonary embolus  No Known Allergies  Patient Measurements: Ht 6'6 Wt ~100 kg Height: 6' 6 (198.1 cm) Weight:  (Pt refused; Notified RN) IBW/kg (Calculated) : 91.4 HEPARIN  DW (KG): 104.3  Vital Signs: Temp: 100 F (37.8 C) (09/20 0815) Temp Source: Oral (09/20 0815) BP: 102/79 (09/20 0815) Pulse Rate: 117 (09/20 0815)  Labs: Recent Labs    02/10/24 2207 02/11/24 0431 02/11/24 0431 02/11/24 1030 02/11/24 1402 02/12/24 0433 02/13/24 0518  HGB  --  12.3*   < >  --   --  12.8* 12.2*  HCT  --  37.0*  --   --   --  38.6* 36.7*  PLT  --  366  --   --   --  399 400  LABPROT  --   --   --   --  17.2* 15.9* 17.0*  INR  --   --   --   --  1.3* 1.2 1.3*  HEPARINUNFRC 0.29* 0.36  --  0.23*  --   --   --   CREATININE  --  1.12  --   --   --  0.84 0.88   < > = values in this interval not displayed.    Estimated Creatinine Clearance: 128.4 mL/min (by C-G formula based on SCr of 0.88 mg/dL).   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 M presents with SOB, CP, hemoptysis. Noted pt on apixaban  pta for h/o PE/DVT (Feb 2025)- last dose 9/14 2030 per patient. Pt reports compliance. CT shows new acute occlusive PE within the left lower lob segmental pulmonary artery supplying the medial basilar segment.  INR today remains subtherapeutic at 1.3, slight increase from 1.2 yesterday after warfarin 7.5 mg x1. Will increase as patient remains subtherapeutic after multiple doses of 7.5mg . Hgb (12.2) and PLTs (400) are stable. Last charted eating 100% of meals, on digoxin  which may increase anticoagulant effect of warfarin.   Goal of Therapy:  INR  2-3 Monitor platelets by anticoagulation protocol: Yes   Plan: Warfarin 10 mg x1 today Continue Lovenox  100 mg [place] q12h until INR therapeutic Daily INR and CBC  Thank you for allowing pharmacy to be a part of this patient's care.   Elma Fail, PharmD PGY1 Clinical Pharmacist [facility]  02/13/2024 9:30 AM

## 2024-02-13 NOTE — Progress Notes (Signed)
 Daily Progress Note Intern Pager: [phone]  Patient name: Anthony Singleton Medical record number: [id number] Date of birth: [birth year] Age: 53 y.o. Gender: male  Primary Care Provider: Pcp, No Consultants: PCCM Code Status: FULL  Pt Overview and Major Events to Date:  9/15 : Admitted for occlusive PE 9/16 : Transfer to Cardiac ICU and was placed on milrinone  gtt 9/19 : Care resumed from Cardiac ICU  Medical Decision Making:  Anthony Singleton is a 53 y.o. male with PMH of hx TBI with seizure, superficial venous thrombosis, biventricular heart failure, factor V leiden, HTN, substance use admitted for acute PE on anticoagulation and now found to have RV thrombus. Also has severe biventricular failure as seen on echo as well as recurrent segmental pulmonary embolism. Suspect contribution from meth/cocaine abuse vs TBI-induced inappropriate sinus tach (per cardiology). Currently undergoing titration of warfarin to appropriate INR level. Adjusting medication regimen; scheduling pain and cough medication and taking away PRNs. Assessment & Plan Pulmonary embolism (HCC) Biventricular failure (HCC) - PICC line, coox and CVP to assess for low output heart failure  - Anticoagulation with warfarin (started 9/18); pharmacy titrating to goal INR of 2-3 - Bridged with Lovenox  - To follow with Coumadin  Clinic in [place] at discharge - Pain regimen: - Tylenol  650 mg q6h scheduled, Gabapentin  300 mg daily at bedtime, Lidocaine  patch daily - Scheduled oxycodone  10 mg q6h - Stopped prn oxy 5mg  (pt had not been requesting this - Fall and seizure precautions in place - PT/OT eval placed HFrEF (heart failure with reduced ejection fraction) (HCC) Echo 02/09/24 with EF of <20%; biventricular failure.  - Appreciate Advanced heart failure consult and care - Planning to continue titrating GDMT - Continue home Farxiga  10 mg daily 10 mg daily, digoxin  0.125 mg daily, Entresto  24-26 mg BID and  spironolactone  25 mg daily - Hold home beta-blocker Thrombosis of upper extremity, bilateral Upper extremity vascular US  done 02/10/24 showing Left upper extremity superficial vein thrombosis of basilic vein; left deep vein thrombosis of axillary vein. Right deep vein thrombosis of axillary and brachial veins, right superficial thrombosis of basilic vein. Lower extremity dopplers negative for DVT. Cough - Scheduled dextromethorphan  30 mg TID before meals - Discontinued prn medications (bedtime dextromethorphan , acetaminophen -codeine , promethazine ) Chronic health problem History of seizures - appears to have been remote in the setting of traumatic brain bleed, not currently on any medications HTN - GDMT as above HLD - continue home rosuvastatin  10 mg daily Drug abuse history - continue home folic acid  1 mg daily and daily multivitamin with minerals Anxiety - continue home Atarax  10 mg 3 times daily as needed Constipation - MiraLAX  17 g packet daily prn, senna 1 tablet at bedtime prn  FEN/GI: Heart healthy PPx: Therapeutic Lovenox  until at appropriate warfarin level Dispo: Pending PT/OT eval and clinical improvement  Subjective:  He reports continued left-sided chest pain and cough, which exacerbates the pain.  Pain also exacerbated by hiccuping and taking deep breath.  No other concerns, no pain elsewhere in his body.  He is hoping to get cosmetic assistance scheduled prior to his meals.  Objective: Temp:  [98.5 F (36.9 C)-99.9 F (37.7 C)] 98.6 F (37 C) (09/20 0628) Pulse Rate:  [112-125] 115 (09/20 0628) Resp:  [14-20] 19 (09/20 0628) BP: (105-131)/(78-97) 105/79 (09/20 0628) SpO2:  [93 %-98 %] 93 % (09/20 9371) Physical Exam: General: Patient lying in bed with head of bed elevated, no acute distress. Cardiovascular: Tachycardic with regular rhythm, no murmurs/rubs/gallops.  Pain to palpation over left lower chest. Respiratory: Normal work of breathing on room air. Clear to  auscultation bilaterally; no wheezes, crackles. Abdomen: Bowel sounds present and normoactive bilaterally. Soft, nondistended, nontender. Extremities: Skin warm, dry. No bilateral lower extremity edema.  Laboratory: Most recent CBC Lab Results  Component Value Date   WBC 9.5 02/13/2024   HGB 12.2 (L) 02/13/2024   HCT 36.7 (L) 02/13/2024   MCV 98.7 02/13/2024   PLT 400 02/13/2024   Most recent BMP    Latest Ref Rng & Units 02/13/2024    5:18 AM  BMP  Glucose 70 - 99 mg/dL 870   BUN 6 - 20 mg/dL 21   Creatinine 9.38 - 1.24 mg/dL 9.11   Sodium 864 - 854 mmol/L 134   Potassium 3.5 - 5.1 mmol/L 4.7   Chloride 98 - 111 mmol/L 101   CO2 22 - 32 mmol/L 22   Calcium  8.9 - 10.3 mg/dL 8.5   Mag: 2.1 INR: 1.3   Anthony Palma, MD 02/13/2024, 8:07 AM  PGY-1, [facility] FPTS Intern pager: [phone], text pages welcome Secure chat group [facility] Teaching Service

## 2024-02-13 NOTE — Progress Notes (Signed)
 Pt refused to get OOB for standing weight this AM d/t feeling too bad

## 2024-02-13 NOTE — Progress Notes (Signed)
  Progress Note  Patient Name: Anthony Singleton Date of Encounter: 02/13/2024 [facility] Cardiologist: CHF Ellena)  Interval Summary   Complains of nausea all the time, not just after receiving narcotics.  Cannot stand the smell of food, makes him sick. Unable to lie on left side or take deep breaths due to pleuritic discomfort.  When he starts coughing, 1 stop. Continues to have resting tachycardia around 120 bpm. Net negative fluid balance 3.7 L since admission, but suspect there has been incomplete recording of intake.  Weight down almost 3 kg since admission.  Vital Signs Vitals:   02/12/24 2024 02/13/24 0011 02/13/24 0628 02/13/24 0815  BP: 117/78 114/83 105/79 102/79  Pulse: (!) 120 (!) 124 (!) 115 (!) 117  Resp: 20 20 19 18   Temp: 98.5 F (36.9 C) 99.4 F (37.4 C) 98.6 F (37 C) 100 F (37.8 C)  TempSrc: Oral Oral Oral Oral  SpO2: 98% 94% 93% 96%  Weight:      Height:        Intake/Output Summary (Last 24 hours) at 02/13/2024 0835 Last data filed at 02/13/2024 0630 Gross per 24 hour  Intake 240 ml  Output 1125 ml  Net -885 ml      02/13/2024    6:28 AM 02/12/2024    4:54 AM 02/11/2024    4:22 AM  Last 3 Weights  Weight (lbs) -- 223 lb 9.6 oz 225 lb  Weight (kg) -- 101.424 kg 102.059 kg      Telemetry/ECG  Sinus tachycardia- Personally Reviewed  Physical Exam  GEN: No acute distress.  Lying completely horizontal in bed on his right side Neck: No JVD Cardiac: RRR, no murmurs, rubs, or gallops.  Respiratory: Clear to auscultation bilaterally. GI: Soft, nontender, non-distended  MS: No edema  Assessment & Plan  53 year old man with history of severe biventricular heart failure, factor V Leiden positive with history of pulmonary embolism 06/2023 previous traumatic brain injury/subdural hematoma, history of polysubstance abuse including cocaine and methamphetamines, presenting with heart failure exacerbation with evidence of low cardiac output  as well as pulmonary embolism and axillary DVT (potentially related to PICC line) despite chronic anticoagulation with Eliquis . Continues to have pleuritic chest pain.  Pain is a constant complaint even before this admission.  Now also having persistent nausea. Will recheck a digoxin  level, since he has now been consistently on digoxin  for several days. Anticoagulation transitioning to warfarin.  INR today essentially unchanged at 1.3. Stable renal parameters and normal electrolytes. Not a candidate for beta-blockers at this time due to low cardiac output.  On digoxin , Farxiga , recently started Entresto , spironolactone .  Will increase the dose of Entresto  this evening or tomorrow morning if blood pressure continues to tolerate well.   For questions or updates, please contact [facility] Please consult [email or web address] for contact info under         Signed, Jerel Balding, MD

## 2024-02-13 NOTE — Assessment & Plan Note (Signed)
 History of seizures - appears to have been remote in the setting of traumatic brain bleed, not currently on any medications HTN - GDMT as above HLD - continue home rosuvastatin  10 mg daily Drug abuse history - continue home folic acid  1 mg daily and daily multivitamin with minerals Anxiety - continue home Atarax  10 mg 3 times daily as needed Constipation - MiraLAX  17 g packet daily prn, senna 1 tablet at bedtime prn

## 2024-02-13 NOTE — Assessment & Plan Note (Addendum)
-   Scheduled dextromethorphan  30 mg TID before meals - Discontinued prn medications (bedtime dextromethorphan , acetaminophen -codeine , promethazine )

## 2024-02-13 NOTE — Evaluation (Signed)
 Physical Therapy Brief Evaluation and Discharge Note Patient Details Name: Anthony Singleton MRN: [id number] DOB: 01/03/1971 Today's Date: 02/13/2024   History of Present Illness  Anthony Singleton is a 53 y.o. male presenting with SOB and hemoptysis and found to have acute PE and now RV thrombus. Patient with severe biventricular failure as well as recurrent segmental pulmonary embolism.  Clinical Impression  Pt presents with admitting diagnosis above. Pt today was able to ambulate around room and short distance in hallway with no AD independently. PTA pt was fully independent. Pt presents at or near baseline mobility. Pt has no further acute PT needs and will be signing off. Re consult PT if mobility status changes. Pt would benefit from continued mobility with mobility specialist during acute stay.        PT Assessment Patient does not need any further PT services  Assistance Needed at Discharge  PRN    Equipment Recommendations None recommended by PT  Recommendations for Other Services       Precautions/Restrictions Precautions Precautions: Fall Recall of Precautions/Restrictions: Intact Restrictions Weight Bearing Restrictions Per Provider Order: No        Mobility  Bed Mobility   Supine/Sidelying to sit: Independent Sit to supine/sidelying: Independent    Transfers Overall transfer level: Independent Equipment used: None                    Ambulation/Gait Ambulation/Gait assistance: Independent Gait Distance (Feet): 100 Feet Assistive device: None Gait Pattern/deviations: WFL(Within Functional Limits) Gait Speed: Pace WFL General Gait Details: no LOB noted  Home Activity Instructions    Stairs            Modified Rankin (Stroke Patients Only)        Balance Overall balance assessment: Independent                        Pertinent Vitals/Pain PT - Brief Vital Signs All Vital Signs Stable: Yes Pain Assessment Pain  Assessment: 0-10 Pain Score: 3  Pain Location: Chest Pain Descriptors / Indicators: Aching Pain Intervention(s): Monitored during session, Premedicated before session     Home Living Family/patient expects to be discharged to:: Private residence Living Arrangements: Children Available Help at Discharge: Family;Available PRN/intermittently Home Environment: Stairs to enter  Progress [employer] of Steps: 4 Home Equipment: Hand held shower head   Additional Comments: Pulled from previous admission    Prior Function Level of Independence: Independent      UE/LE Assessment   UE ROM/Strength/Tone/Coordination: WFL    LE ROM/Strength/Tone/Coordination: [facility]      Communication   Communication Communication: No apparent difficulties     Cognition Overall Cognitive Status: Appears within functional limits for tasks assessed/performed       General Comments General comments (skin integrity, edema, etc.): HR in the 120s during ambulation.    Exercises     Assessment/Plan    PT Problem List         PT Visit Diagnosis Other abnormalities of gait and mobility (R26.89)    No Skilled PT Patient at baseline level of functioning;Patient is independent with all acitivity/mobility   Co-evaluation                AMPAC 6 Clicks Help needed turning from your back to your side while in a flat bed without using bedrails?: None Help needed moving from lying on your back to sitting on the side of a flat bed without using bedrails?: None Help  needed moving to and from a bed to a chair (including a wheelchair)?: None Help needed standing up from a chair using your arms (e.g., wheelchair or bedside chair)?: None Help needed to walk in hospital room?: None Help needed climbing 3-5 steps with a railing? : None 6 Click Score: 24      End of Session Equipment Utilized During Treatment: Gait belt Activity Tolerance: Patient tolerated treatment well Patient left: in bed;with call  bell/phone within reach;with nursing/sitter in room Nurse Communication: Mobility status PT Visit Diagnosis: Other abnormalities of gait and mobility (R26.89)     Time: 1400-1408 PT Time Calculation (min) (ACUTE ONLY): 8 min  Charges:   PT Evaluation $PT Eval Low Complexity: 1 Low      Sueellen NOVAK, PT, DPT Acute Rehab Services [phone]   Astin Rape  02/13/2024, 2:46 PM

## 2024-02-13 NOTE — Plan of Care (Signed)
  Problem: Education: Goal: Knowledge of General Education information will improve Description: Including pain rating scale, medication(s)/side effects and non-pharmacologic comfort measures Outcome: Progressing   Problem: Health Behavior/Discharge Planning: Goal: Ability to manage health-related needs will improve Outcome: Progressing   Problem: Activity: Goal: Risk for activity intolerance will decrease Outcome: Progressing   Problem: Nutrition: Goal: Adequate nutrition will be maintained Outcome: Not Progressing   Problem: Coping: Goal: Level of anxiety will decrease Outcome: Not Progressing   Problem: Elimination: Goal: Will not experience complications related to bowel motility Outcome: Progressing Goal: Will not experience complications related to urinary retention Outcome: Progressing   Problem: Pain Managment: Goal: General experience of comfort will improve and/or be controlled Outcome: Progressing   Problem: Safety: Goal: Ability to remain free from injury will improve Outcome: Progressing   Problem: Skin Integrity: Goal: Risk for impaired skin integrity will decrease Outcome: Progressing

## 2024-02-13 NOTE — Assessment & Plan Note (Signed)
 Upper extremity vascular US  done 02/10/24 showing Left upper extremity superficial vein thrombosis of basilic vein; left deep vein thrombosis of axillary vein. Right deep vein thrombosis of axillary and brachial veins, right superficial thrombosis of basilic vein. Lower extremity dopplers negative for DVT.

## 2024-02-13 NOTE — Assessment & Plan Note (Signed)
 Echo 02/09/24 with EF of <20%; biventricular failure.  - Appreciate Advanced heart failure consult and care - Planning to continue titrating GDMT - Continue home Farxiga  10 mg daily 10 mg daily, digoxin  0.125 mg daily, Entresto  24-26 mg BID and spironolactone  25 mg daily - Hold home beta-blocker

## 2024-02-14 DIAGNOSIS — I2699 Other pulmonary embolism without acute cor pulmonale: Secondary | ICD-10-CM | POA: Diagnosis not present

## 2024-02-14 DIAGNOSIS — R110 Nausea: Secondary | ICD-10-CM | POA: Insufficient documentation

## 2024-02-14 LAB — CBC
HCT: 35.1 % — ABNORMAL LOW (ref 39.0–52.0)
Hemoglobin: 11.9 g/dL — ABNORMAL LOW (ref 13.0–17.0)
MCH: 32.9 pg (ref 26.0–34.0)
MCHC: 33.9 g/dL (ref 30.0–36.0)
MCV: 97 fL (ref 80.0–100.0)
Platelets: 370 K/uL (ref 150–400)
RBC: 3.62 MIL/uL — ABNORMAL LOW (ref 4.22–5.81)
RDW: 13.2 % (ref 11.5–15.5)
WBC: 9.1 K/uL (ref 4.0–10.5)
nRBC: 0 % (ref 0.0–0.2)

## 2024-02-14 LAB — HEPATIC FUNCTION PANEL
ALT: 16 U/L (ref 0–44)
AST: 18 U/L (ref 15–41)
Albumin: 2 g/dL — ABNORMAL LOW (ref 3.5–5.0)
Alkaline Phosphatase: 62 U/L (ref 38–126)
Bilirubin, Direct: 0.1 mg/dL (ref 0.0–0.2)
Indirect Bilirubin: 0.5 mg/dL (ref 0.3–0.9)
Total Bilirubin: 0.6 mg/dL (ref 0.0–1.2)
Total Protein: 7.1 g/dL (ref 6.5–8.1)

## 2024-02-14 LAB — BASIC METABOLIC PANEL WITH GFR
Anion gap: 10 (ref 5–15)
BUN: 17 mg/dL (ref 6–20)
CO2: 21 mmol/L — ABNORMAL LOW (ref 22–32)
Calcium: 8.3 mg/dL — ABNORMAL LOW (ref 8.9–10.3)
Chloride: 103 mmol/L (ref 98–111)
Creatinine, Ser: 0.93 mg/dL (ref 0.61–1.24)
GFR, Estimated: >60 mL/min (ref >60)
Glucose, Bld: 106 mg/dL — ABNORMAL HIGH (ref 70–99)
Potassium: 4.5 mmol/L (ref 3.5–5.1)
Sodium: 134 mmol/L — ABNORMAL LOW (ref 135–145)

## 2024-02-14 LAB — LIPASE, BLOOD: Lipase: 15 U/L (ref 11–51)

## 2024-02-14 LAB — DIGOXIN LEVEL: Digoxin Level: 0.8 ng/mL (ref 0.8–2.0)

## 2024-02-14 LAB — MAGNESIUM: Magnesium: 1.9 mg/dL (ref 1.7–2.4)

## 2024-02-14 LAB — PROTIME-INR
INR: 2 — ABNORMAL HIGH (ref 0.8–1.2)
Prothrombin Time: 23.9 s — ABNORMAL HIGH (ref 11.4–15.2)

## 2024-02-14 MED ORDER — WARFARIN SODIUM 7.5 MG PO TABS
7.5000 mg | ORAL_TABLET | Freq: Once | ORAL | Status: AC
  Administered 2024-02-14: 7.5 mg via ORAL
  Filled 2024-02-14: qty 1

## 2024-02-14 MED ORDER — SACUBITRIL-VALSARTAN 49-51 MG PO TABS
1.0000 | ORAL_TABLET | Freq: Two times a day (BID) | ORAL | Status: DC
  Administered 2024-02-14 – 2024-02-15 (×3): 1 via ORAL
  Filled 2024-02-14 (×3): qty 1

## 2024-02-14 MED ORDER — OXYCODONE HCL 5 MG PO TABS
7.5000 mg | ORAL_TABLET | Freq: Four times a day (QID) | ORAL | Status: DC
  Administered 2024-02-14 – 2024-02-15 (×5): 7.5 mg via ORAL
  Filled 2024-02-14 (×5): qty 2

## 2024-02-14 NOTE — Assessment & Plan Note (Addendum)
 Echo 02/09/24 with EF of <20%; biventricular failure.  Cardiology has signed off and will follow-up with him early outpatient.  May need an ICD outpatient. - Appreciate Advanced heart failure consult and care - Planning to continue titrating GDMT - Continue home Farxiga  10 mg daily 10 mg daily, digoxin  0.125 mg daily (pending level), Entresto  to be increased to 49/51 mg BID and spironolactone  25 mg daily - Hold home beta-blocker for now

## 2024-02-14 NOTE — Progress Notes (Signed)
 Daily Progress Note Intern Pager: [phone]  Patient name: Anthony Singleton Medical record number: [id number] Date of birth: [birth year] Age: 53 y.o. Gender: male  Primary Care Provider: Pcp, No Consultants: PCCM Code Status: Full  Pt Overview and Major Events to Date:  9/15-admitted for occlusive PE 9/16-transferred to cardiac ICU, placed on milrinone  gtt. 9/19-care resumed from cardiac ICU  Medical Decision Making:  Sherley Leser is a 53 y.o. male with PMH of hx TBI with seizure, superficial venous thrombosis, biventricular heart failure, questionable factor V leiden (given labs were collected during an acute clot), HTN, substance use admitted for acute PE on anticoagulation and now found to have RV thrombus with evidence of severe biventricular failure on echo (questionably secondary to meth/cocaine abuse versus TBI induced inappropriate sinus tach). Assessment & Plan Pulmonary embolism and infarction (HCC) Biventricular failure (HCC) Vitals and exam stable.  PT/OT without recommendations for rehabilitation at discharge.  Cough likely in the setting of PE. - Anticoagulation with warfarin (started 9/18) given deemed Eliquis  failure; pharmacy titrating to goal INR of 2-3 - Bridged with Lovenox  - To follow with Coumadin  Clinic in [place] at discharge - Pain regimen for pleuritic left chest pain likely due to PE: - Tylenol  650 mg q6h scheduled, Gabapentin  300 mg daily at bedtime, Lidocaine  patch daily - Scheduled oxycodone  10>7.5 mg q6h today -Continue cough regimen of scheduled dextromethorphan  30 mg 3 times daily before meals with Tessalon  Perles - Fall and seizure precautions in place Nausea Prohibiting him from eating.  Questionably due to opioid pain regimen versus his heart failure.  KUB without fecal impaction. -Add on hepatic function panel as well as lipase for today -Continue PPI and bowel regimen -RD consulted HFrEF (heart failure with reduced ejection  fraction) (HCC) Echo 02/09/24 with EF of <20%; biventricular failure.  Cardiology has signed off and will follow-up with him early outpatient.  May need an ICD outpatient. - Appreciate Advanced heart failure consult and care - Planning to continue titrating GDMT - Continue home Farxiga  10 mg daily 10 mg daily, digoxin  0.125 mg daily (pending level), Entresto  to be increased to 49/51 mg BID and spironolactone  25 mg daily - Hold home beta-blocker for now Thrombosis of upper extremity, bilateral Upper extremity vascular US  done 02/10/24 showing Left upper extremity superficial vein thrombosis of basilic vein; left deep vein thrombosis of axillary vein. Right deep vein thrombosis of axillary and brachial veins, right superficial thrombosis of basilic vein. Lower extremity dopplers negative for DVT. -Continue Lovenox  bridging with ultimate goals of warfarin stability Chronic health problem History of seizures - appears to have been remote in the setting of traumatic brain bleed, not currently on any medications HTN - GDMT as above HLD - continue home rosuvastatin  10 mg daily Drug abuse history - continue home folic acid  1 mg daily and daily multivitamin with minerals Anxiety - continue home Atarax  10 mg 3 times daily as needed  FEN/GI: Heart healthy diet PPx: Therapeutic Lovenox  until appropriate warfarin level, goal INR 2-3 Dispo:Pending PT recommendations  pending clinical improvement , hopefully 9/22  Subjective:  Both chest pain and cough are improved today.  He is interested in going home tomorrow if he continues to improve and INR is within good range.  Objective: Temp:  [98 F (36.7 C)-99.1 F (37.3 C)] 98.5 F (36.9 C) (09/21 0751) Pulse Rate:  [106-118] 118 (09/21 0751) Resp:  [18-20] 18 (09/21 0751) BP: (101-110)/(68-79) 109/74 (09/21 0751) SpO2:  [94 %-100 %] 94 % (  09/21 0751) Weight:  [97.3 kg] 97.3 kg (09/21 0644) Physical Exam: General: Sitting up, no acute  distress Cardiovascular: Tachycardic, regular rhythm, no murmurs rubs or gallops Respiratory: Clear to auscultation bilaterally anteriorly without wheezes rales or rhonchi Abdomen: Soft, nondistended, nontender, normoactive bowel sounds Extremities: Moves all extremities grossly equally  Laboratory: Most recent CBC Lab Results  Component Value Date   WBC 9.1 02/14/2024   HGB 11.9 (L) 02/14/2024   HCT 35.1 (L) 02/14/2024   MCV 97.0 02/14/2024   PLT 370 02/14/2024   Most recent BMP    Latest Ref Rng & Units 02/14/2024    3:43 AM  BMP  Glucose 70 - 99 mg/dL 893   BUN 6 - 20 mg/dL 17   Creatinine 9.38 - 1.24 mg/dL 9.06   Sodium 864 - 854 mmol/L 134   Potassium 3.5 - 5.1 mmol/L 4.5   Chloride 98 - 111 mmol/L 103   CO2 22 - 32 mmol/L 21   Calcium  8.9 - 10.3 mg/dL 8.3    INR 2.0  Imaging/Diagnostic Tests: KUB IMPRESSION: Negative.  Tharon Lung, MD 02/14/2024, 8:19 AM  PGY-3, [facility] FPTS Intern pager: [phone], text pages welcome Secure chat group [facility] Teaching Service

## 2024-02-14 NOTE — Plan of Care (Signed)

## 2024-02-14 NOTE — Assessment & Plan Note (Signed)
 Upper extremity vascular US  done 02/10/24 showing Left upper extremity superficial vein thrombosis of basilic vein; left deep vein thrombosis of axillary vein. Right deep vein thrombosis of axillary and brachial veins, right superficial thrombosis of basilic vein. Lower extremity dopplers negative for DVT. -Continue Lovenox  bridging with ultimate goals of warfarin stability

## 2024-02-14 NOTE — Plan of Care (Signed)

## 2024-02-14 NOTE — Progress Notes (Signed)
  Progress Note  Patient Name: Anthony Singleton Date of Encounter: 02/14/2024 [facility] Cardiologist: Rolan   Interval Summary   Nausea is much better, as long as he times the medications and his meals right. Left-sided pleuritic pain has improved, not completely resolved. Remains tachycardic, but otherwise appears well compensated.  Able to lie fully flat without dyspnea.  Reasonable blood pressure.  Stable renal function. INR therapeutic 2.0 for the first time today.  Vital Signs Vitals:   02/13/24 1941 02/14/24 0008 02/14/24 0644 02/14/24 0751  BP: 106/77 110/77 101/68 109/74  Pulse: (!) 106 (!) 115 (!) 109 (!) 118  Resp: 18 18 18 18   Temp: 98.9 F (37.2 C) 98.9 F (37.2 C) 98 F (36.7 C) 98.5 F (36.9 C)  TempSrc: Oral Oral Axillary Oral  SpO2: 100% 95% 95% 94%  Weight:   97.3 kg   Height:        Intake/Output Summary (Last 24 hours) at 02/14/2024 0811 Last data filed at 02/13/2024 1800 Gross per 24 hour  Intake 980 ml  Output --  Net 980 ml      02/14/2024    6:44 AM 02/13/2024    6:28 AM 02/12/2024    4:54 AM  Last 3 Weights  Weight (lbs) 214 lb 9.6 oz -- 223 lb 9.6 oz  Weight (kg) 97.342 kg -- 101.424 kg      Telemetry/ECG  Sinus tachy - Personally Reviewed  Physical Exam  GEN: No acute distress.   Neck: No JVD Cardiac: Tachycardia, RRR, no murmurs, rubs, or gallops.  Respiratory: Clear to auscultation bilaterally. GI: Soft, nontender, non-distended  MS: No edema  Assessment & Plan  Heart failure well compensated.  Will try to increase the Entresto  to the 49/51 mg twice daily dose today. Digoxin  level ordered for this morning.  Will follow-up on that. Remains premature to start beta-blockers.  Otherwise on comprehensive medical therapy for cardiomyopathy. No plans for additional titration of heart failure medication during this admission, but he will require early follow-up (he already has a follow-up in [place] 02/23/2024 Coumadin   clinic and in the heart failure clinic 02/24/2024).   For questions or updates, please contact [facility] Please consult [email or web address] for contact info under         Signed, Jerel Balding, MD

## 2024-02-14 NOTE — Assessment & Plan Note (Signed)
 History of seizures - appears to have been remote in the setting of traumatic brain bleed, not currently on any medications HTN - GDMT as above HLD - continue home rosuvastatin  10 mg daily Drug abuse history - continue home folic acid  1 mg daily and daily multivitamin with minerals Anxiety - continue home Atarax  10 mg 3 times daily as needed

## 2024-02-14 NOTE — Assessment & Plan Note (Addendum)
 Vitals and exam stable.  PT/OT without recommendations for rehabilitation at discharge.  Cough likely in the setting of PE. - Anticoagulation with warfarin (started 9/18) given deemed Eliquis  failure; pharmacy titrating to goal INR of 2-3 - Bridged with Lovenox  - To follow with Coumadin  Clinic in [place] at discharge - Pain regimen for pleuritic left chest pain likely due to PE: - Tylenol  650 mg q6h scheduled, Gabapentin  300 mg daily at bedtime, Lidocaine  patch daily - Scheduled oxycodone  10>7.5 mg q6h today -Continue cough regimen of scheduled dextromethorphan  30 mg 3 times daily before meals with Tessalon  Perles - Fall and seizure precautions in place

## 2024-02-14 NOTE — Progress Notes (Signed)
 PHARMACY - ANTICOAGULATION CONSULT NOTE  Pharmacy Consult for Heparin  >> lovenox  and warfarin Indication: pulmonary embolus  No Known Allergies  Patient Measurements: Ht 6'6 Wt ~100 kg Height: 6' 6 (198.1 cm) Weight: 97.3 kg (214 lb 9.6 oz) IBW/kg (Calculated) : 91.4 HEPARIN  DW (KG): 104.3  Vital Signs: Temp: 98.5 F (36.9 C) (09/21 0751) Temp Source: Oral (09/21 0751) BP: 109/74 (09/21 0751) Pulse Rate: 118 (09/21 0751)  Labs: Recent Labs    02/11/24 1030 02/11/24 1402 02/12/24 0433 02/13/24 0518 02/14/24 0343  HGB  --    < > 12.8* 12.2* 11.9*  HCT  --   --  38.6* 36.7* 35.1*  PLT  --   --  399 400 370  LABPROT  --    < > 15.9* 17.0* 23.9*  INR  --    < > 1.2 1.3* 2.0*  HEPARINUNFRC 0.23*  --   --   --   --   CREATININE  --   --  0.84 0.88 0.93   < > = values in this interval not displayed.    Estimated Creatinine Clearance: 121.5 mL/min (by C-G formula based on SCr of 0.93 mg/dL).   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 M presents with SOB, CP, hemoptysis. Noted pt on apixaban  pta for h/o PE/DVT (Feb 2025)- last dose 9/14 2030 per patient. Pt reports compliance. CT shows new acute occlusive PE within the left lower lob segmental pulmonary artery supplying the medial basilar segment.  INR today therapeutic at 2.0 (up from 1.3), will continue previous 7.5mg  dosing given jump in INR to remain therapeutic and avoid becoming supratherapeutic. No signs/symptoms of bleeding per RN. Hgb (11.9) and PLTs (370) are stable. Last charted eating 100% of meals, on digoxin  which may increase anticoagulant effect of warfarin.   Goal of Therapy:  INR 2-3 Monitor platelets by anticoagulation protocol: Yes   Plan: Warfarin 7.5 mg PO x1  today Continue Lovenox  100 mg [place] q12h until INR therapeutic Daily INR and CBC  Thank you for allowing pharmacy to be a part of this patient's care.   Elma Fail, PharmD PGY1 Clinical Pharmacist [facility]  02/14/2024 9:25 AM

## 2024-02-14 NOTE — Assessment & Plan Note (Signed)
 Prohibiting him from eating.  Questionably due to opioid pain regimen versus his heart failure.  KUB without fecal impaction. -Add on hepatic function panel as well as lipase for today -Continue PPI and bowel regimen -RD consulted

## 2024-02-14 NOTE — Progress Notes (Signed)
 Pt c/o unrelieved cough after scheduled delsym . On call provider notified & order received for BID tessalon  perles

## 2024-02-14 NOTE — Progress Notes (Signed)
 Pharmacy monitoring note:  Patient started on digoxin  9/15. Today is day 7 of therapy. Level drawn 9/21 after dose was given (0.8ng/mL). Will reorder a new dose and follow up on digoxin  level tomorrow.   Thank you for allowing pharmacy to be a part of this patient's care.  Elma Fail, PharmD PGY1 Clinical Pharmacist [facility]  02/14/2024 3:45 PM

## 2024-02-15 ENCOUNTER — Other Ambulatory Visit (HOSPITAL_COMMUNITY): Payer: Self-pay

## 2024-02-15 DIAGNOSIS — I2699 Other pulmonary embolism without acute cor pulmonale: Secondary | ICD-10-CM | POA: Diagnosis not present

## 2024-02-15 DIAGNOSIS — I82603 Acute embolism and thrombosis of unspecified veins of upper extremity, bilateral: Secondary | ICD-10-CM | POA: Diagnosis not present

## 2024-02-15 DIAGNOSIS — I5082 Biventricular heart failure: Secondary | ICD-10-CM | POA: Diagnosis not present

## 2024-02-15 LAB — PROTIME-INR
INR: 2.8 — ABNORMAL HIGH (ref 0.8–1.2)
Prothrombin Time: 31.2 s — ABNORMAL HIGH (ref 11.4–15.2)

## 2024-02-15 MED ORDER — OXYCODONE HCL 15 MG PO TABS
7.5000 mg | ORAL_TABLET | Freq: Four times a day (QID) | ORAL | 0 refills | Status: AC | PRN
  Filled 2024-02-15: qty 10, 5d supply, fill #0

## 2024-02-15 MED ORDER — PANTOPRAZOLE SODIUM 40 MG PO TBEC
40.0000 mg | DELAYED_RELEASE_TABLET | Freq: Every day | ORAL | 0 refills | Status: DC
  Filled 2024-02-15: qty 30, 30d supply, fill #0

## 2024-02-15 MED ORDER — SACUBITRIL-VALSARTAN 49-51 MG PO TABS
1.0000 | ORAL_TABLET | Freq: Two times a day (BID) | ORAL | 0 refills | Status: DC
  Filled 2024-02-15: qty 60, 30d supply, fill #0

## 2024-02-15 MED ORDER — SENNOSIDES-DOCUSATE SODIUM 8.6-50 MG PO TABS: 2.0000 | ORAL_TABLET | Freq: Every day | ORAL | Status: DC

## 2024-02-15 MED ORDER — LIDOCAINE 5 % EX PTCH
1.0000 | MEDICATED_PATCH | CUTANEOUS | 0 refills | Status: DC
  Filled 2024-02-15: qty 30, 30d supply, fill #0

## 2024-02-15 MED ORDER — DIGOXIN 125 MCG PO TABS: 0.0625 mg | ORAL_TABLET | Freq: Every day | ORAL | Status: DC

## 2024-02-15 MED ORDER — DIGOXIN 125 MCG PO TABS
0.1250 mg | ORAL_TABLET | Freq: Every day | ORAL | Status: DC
Start: 2024-02-16 — End: 2024-02-15

## 2024-02-15 MED ORDER — WARFARIN SODIUM 5 MG PO TABS
5.0000 mg | ORAL_TABLET | Freq: Every day | ORAL | 0 refills | Status: DC
  Filled 2024-02-15: qty 30, 30d supply, fill #0

## 2024-02-15 MED ORDER — GABAPENTIN 300 MG PO CAPS
300.0000 mg | ORAL_CAPSULE | Freq: Every day | ORAL | 0 refills | Status: DC
  Filled 2024-02-15: qty 30, 30d supply, fill #0

## 2024-02-15 MED ORDER — DEXTROMETHORPHAN POLISTIREX ER 30 MG/5ML PO SUER: 30.0000 mg | Freq: Three times a day (TID) | ORAL | Status: AC

## 2024-02-15 MED ORDER — BENZONATATE 100 MG PO CAPS
100.0000 mg | ORAL_CAPSULE | Freq: Two times a day (BID) | ORAL | 0 refills | Status: DC | PRN
  Filled 2024-02-15: qty 20, 10d supply, fill #0

## 2024-02-15 NOTE — Plan of Care (Signed)
  Problem: Education: Goal: Knowledge of General Education information will improve Description: Including pain rating scale, medication(s)/side effects and non-pharmacologic comfort measures Outcome: Progressing   Problem: Health Behavior/Discharge Planning: Goal: Ability to manage health-related needs will improve Outcome: Progressing   Problem: Clinical Measurements: Goal: Cardiovascular complication will be avoided Outcome: Progressing   Problem: Activity: Goal: Risk for activity intolerance will decrease Outcome: Progressing   Problem: Nutrition: Goal: Adequate nutrition will be maintained Outcome: Progressing   Problem: Elimination: Goal: Will not experience complications related to bowel motility Outcome: Progressing Goal: Will not experience complications related to urinary retention Outcome: Progressing   Problem: Pain Managment: Goal: General experience of comfort will improve and/or be controlled Outcome: Progressing   Problem: Safety: Goal: Ability to remain free from injury will improve Outcome: Progressing   Problem: Skin Integrity: Goal: Risk for impaired skin integrity will decrease Outcome: Progressing
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
## 2024-02-15 NOTE — Progress Notes (Signed)
 DISCHARGE NOTE HOME Samad Thon to be discharged Home per MD order. Discussed prescriptions and follow up appointments with the patient. Prescriptions given to patient; medication list explained in detail. Patient verbalized understanding.  Skin clean, dry and intact without evidence of skin break down, no evidence of skin tears noted. IV catheter discontinued intact. Site without signs and symptoms of complications. Dressing and pressure applied. Pt denies pain at the site currently. No complaints noted.  Patient free of lines, drains, and wounds.   An After Visit Summary (AVS) was printed and given to the patient. Patient escorted via wheelchair, and discharged home via private auto.  Peyton SHAUNNA Pepper, RN

## 2024-02-15 NOTE — TOC Progression Note (Signed)
 Transition of Care [facility]) - Progression Note    Patient Details  Name: Anthony Singleton MRN: [id number] Date of Birth: 08/28/1970  Transition of Care [facility]) CM/SW Contact  Graves-Bigelow, Erminio Deems, RN Phone Number: 02/15/2024, 11:31 AM  Clinical Narrative: Patient presented for shortness of breath and PE. PCP appointment has been arranged in [place] for this patient and is on the AVS. No further Inpatient Case Manager needs identified at this time.   Expected Discharge Plan: Home/Self Care Barriers to Discharge: No Barriers Identified  Expected Discharge Plan and Services In-house Referral: NA Discharge Planning Services: CM Consult Post Acute Care Choice: NA Living arrangements for the past 2 months: Single Family Home                   DME Agency: NA       HH Arranged: NA   Social Drivers of Health (SDOH) Interventions SDOH Screenings   Food Insecurity: No Food Insecurity (02/08/2024)  Housing: Low Risk  (02/08/2024)  Transportation Needs: No Transportation Needs (02/08/2024)  Utilities: Not At Risk (02/08/2024)  Depression (PHQ2-9): Medium Risk (01/19/2019)  Tobacco Use: Low Risk  (02/11/2024)    Readmission Risk Interventions    02/12/2024   12:48 PM  Readmission Risk Prevention Plan  Transportation Screening Complete  PCP or Specialist Appt within 3-5 Days Complete  HRI or Home Care Consult Complete  Social Work Consult for Recovery Care Planning/Counseling Complete  Palliative Care Screening Not Applicable  Medication Review Oceanographer) Referral to Pharmacy

## 2024-02-18 ENCOUNTER — Other Ambulatory Visit: Payer: Self-pay

## 2024-02-18 ENCOUNTER — Ambulatory Visit (HOSPITAL_COMMUNITY)
Admission: RE | Admit: 2024-02-18 | Discharge: 2024-02-18 | Disposition: A | Discharge status: Home / Self Care | Source: Ambulatory Visit | Attending: Family Medicine | Admitting: Family Medicine

## 2024-02-18 ENCOUNTER — Ambulatory Visit (INDEPENDENT_AMBULATORY_CARE_PROVIDER_SITE_OTHER): Admitting: Family Medicine

## 2024-02-18 VITALS — BP 91/69 | HR 129 | Temp 98.6°F

## 2024-02-18 DIAGNOSIS — R079 Chest pain, unspecified: Secondary | ICD-10-CM

## 2024-02-18 DIAGNOSIS — I2609 Other pulmonary embolism with acute cor pulmonale: Secondary | ICD-10-CM

## 2024-02-18 DIAGNOSIS — I2699 Other pulmonary embolism without acute cor pulmonale: Secondary | ICD-10-CM

## 2024-02-18 NOTE — Patient Instructions (Addendum)
 It was great to see you! Thank you for allowing me to participate in your care!  Our plans for today:  - Please continue to take your warfarin and Entresto  as prescribed. - If you have any new changes in your chest pain or new difficulty breathing please go to the emergency department. - You have an appointment next week to meet your new primary care doctor and you also have appointments with cardiology.   Please arrive 15 minutes PRIOR to your next scheduled appointment time! If you do not, this affects OTHER patients' care.  Take care and seek immediate care sooner if you develop any concerns.   Ozell Provencal, MD, PGY-3 [facility] Medicine 3:18 PM 02/18/2024  [facility]

## 2024-02-18 NOTE — Assessment & Plan Note (Signed)
 Patient's continued chest pain is likely secondary to acute pulmonary embolism that continues to be treated with warfarin.  No clear clinical changes since discharge to suggest new or worsening PE, ACS or other cause.  While blood pressure was initially concerning, on recheck of left arm it improved to similar values as day of discharge.  He additionally is warm and well-perfused in his extremities and I do not suspect that he is a low output state.  Baseline heart rate is in the 100s, suspect that the increase today is secondary to exertion on walking to and from the clinic.  Additionally, his EKG does not have new ischemic findings and is consistent with previous on 09/19.   Patient overall stable for INR check and follow-up next week with new PCP and cardiology.   Recommended to continue Entresto  at current dosing and continue to take warfarin as prescribed.

## 2024-02-18 NOTE — Progress Notes (Signed)
    SUBJECTIVE:   CHIEF COMPLAINT / HPI: Chest pain  Patient originally scheduled today for INR lab check as bridge before initiating care with new PCP after being hospitalized. Patient reported continued chest pain at the lab visit and patient was scheduled in this clinic. He reports that this left-sided sharp stabbing chest pain has been ongoing since he was hospitalized for acute pulmonary embolism and heart failure. There has not been a change in timing quality or severity at all. He has not had any lightheaded or dizziness, he has not passed out. He notes he was severely winded from walking to and from the clinic today. He notes that his blood pressure on the right arm is often lower than the 1 on the left arm. He has been taking his Entresto  and warfarin regularly.  Follow-up Recommendations: Follow up with anticoagulation compliance.  Follow up with heart failure clinic for management. Follow up for INR check on 02/18/24 at [facility]  PERTINENT  PMH / PSH: Biventricular heart failure, DVT and PE  OBJECTIVE:   BP 91/69   Pulse (!) 129   Temp 98.6 F (37 C) (Oral)   SpO2 100%   General: NAD, chronically ill-appearing Neuro: A&O Cardiovascular: RRR, no murmurs, heart sounds, left chest wall mildly tender to palpation Respiratory: Increased work of breathing on room air, CTAB, no wheezes, ronchi or rales Extremities: Moving all 4 extremities equally, peripheral edema bilaterally, warm and well-perfused  Blood pressure is 80 over 60s on right arm, on left arm 91/69.  EKG today with sinus tachycardia, left ventricular hypertrophy, unchanged T wave inversions in V4 V5 V6 V2 I  ASSESSMENT/PLAN:   Assessment & Plan Chest pain, unspecified type Other acute pulmonary embolism with acute cor pulmonale (HCC) Patient's continued chest pain is likely secondary to acute pulmonary embolism that continues to be treated with warfarin.  No clear clinical  changes since discharge to suggest new or worsening PE, ACS or other cause.  While blood pressure was initially concerning, on recheck of left arm it improved to similar values as day of discharge.  He additionally is warm and well-perfused in his extremities and I do not suspect that he is a low output state.  Baseline heart rate is in the 100s, suspect that the increase today is secondary to exertion on walking to and from the clinic.  Additionally, his EKG does not have new ischemic findings and is consistent with previous on 09/19.   Patient overall stable for INR check and follow-up next week with new PCP and cardiology.   Recommended to continue Entresto  at current dosing and continue to take warfarin as prescribed.  Follow-up at your new PCP next week.  Precepted with Dr. Delores.  Ozell Provencal, MD [facility]

## 2024-02-19 ENCOUNTER — Ambulatory Visit: Payer: Self-pay | Admitting: Family Medicine

## 2024-02-19 ENCOUNTER — Ambulatory Visit (HOSPITAL_COMMUNITY)
Admission: RE | Admit: 2024-02-19 | Discharge: 2024-02-19 | Disposition: A | Discharge status: Home / Self Care | Source: Ambulatory Visit | Attending: Family Medicine | Admitting: Family Medicine

## 2024-02-19 LAB — PROTIME-INR
INR: 3.1 — ABNORMAL HIGH (ref 0.9–1.2)
Prothrombin Time: 31.4 s — ABNORMAL HIGH (ref 9.1–12.0)

## 2024-02-19 NOTE — Progress Notes (Signed)
 Attempted to reach patient. No answer. LVM of note left by Dr. Keneth. Nelson Land, CMA

## 2024-02-19 NOTE — Progress Notes (Signed)
 Will ask patient to continue warfarin 5 mg daily until he is seen at Cardiology's anticoagulation clinic on 02/24/24.

## 2024-02-19 NOTE — Telephone Encounter (Signed)
 Please contact Mr Rohleder to recommend patient continue warfarin 5 mg daily until seen at Cardiology Coumadin  clini on 02/24/24.
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
## 2024-02-21 ENCOUNTER — Other Ambulatory Visit: Payer: Self-pay

## 2024-02-21 DIAGNOSIS — I2699 Other pulmonary embolism without acute cor pulmonale: Secondary | ICD-10-CM

## 2024-02-22 ENCOUNTER — Other Ambulatory Visit: Payer: Self-pay | Admitting: Medical Genetics

## 2024-02-23 ENCOUNTER — Telehealth (HOSPITAL_COMMUNITY): Payer: Self-pay

## 2024-02-23 ENCOUNTER — Ambulatory Visit

## 2024-02-23 DIAGNOSIS — I2699 Other pulmonary embolism without acute cor pulmonale: Secondary | ICD-10-CM | POA: Diagnosis not present

## 2024-02-23 DIAGNOSIS — Z7901 Long term (current) use of anticoagulants: Secondary | ICD-10-CM | POA: Insufficient documentation

## 2024-02-23 LAB — POCT INR: INR: 5.6 — AB (ref 2.0–3.0)

## 2024-02-23 NOTE — Telephone Encounter (Signed)
 Called to confirm/remind patient of their appointment at the [facility] on 02/24/24.   Appointment:   [x] Confirmed  [] Left mess   [] No answer/No voice mail  [] VM Full/unable to leave message  [] Phone not in service  Patient reminded to bring all medications and/or complete list.  Confirmed patient has transportation. Gave directions, instructed to utilize valet parking.

## 2024-02-23 NOTE — Patient Instructions (Signed)
 Hold Wednesday, Thursday and Friday then Decrease to 0.5 tablet Daily, except 1 tablet every Monday, Wednesday and Friday.  INR in 1 week.  [phone]  A full discussion of the nature of anticoagulants has been carried out.  A benefit risk analysis has been presented to the patient, so that they understand the justification for choosing anticoagulation at this time. The need for frequent and regular monitoring, precise dosage adjustment and compliance is stressed.  Side effects of potential bleeding are discussed.  The patient should avoid any OTC items containing aspirin  or ibuprofen, and should avoid great swings in general diet.  Avoid alcohol consumption.  Call if any signs of abnormal bleeding.

## 2024-02-24 ENCOUNTER — Ambulatory Visit (HOSPITAL_COMMUNITY)
Admit: 2024-02-24 | Discharge: 2024-02-24 | Disposition: A | Discharge status: Home / Self Care | Attending: Family Medicine | Admitting: Family Medicine

## 2024-02-24 ENCOUNTER — Encounter (HOSPITAL_COMMUNITY): Payer: Self-pay

## 2024-02-24 ENCOUNTER — Ambulatory Visit (HOSPITAL_COMMUNITY): Payer: Self-pay | Admitting: Family Medicine

## 2024-02-24 VITALS — BP 104/84 | HR 73 | Wt 219.0 lb

## 2024-02-24 DIAGNOSIS — R0602 Shortness of breath: Secondary | ICD-10-CM | POA: Diagnosis not present

## 2024-02-24 DIAGNOSIS — Z7984 Long term (current) use of oral hypoglycemic drugs: Secondary | ICD-10-CM | POA: Diagnosis not present

## 2024-02-24 DIAGNOSIS — Z86711 Personal history of pulmonary embolism: Secondary | ICD-10-CM | POA: Diagnosis not present

## 2024-02-24 DIAGNOSIS — Z87820 Personal history of traumatic brain injury: Secondary | ICD-10-CM | POA: Insufficient documentation

## 2024-02-24 DIAGNOSIS — I5020 Unspecified systolic (congestive) heart failure: Secondary | ICD-10-CM | POA: Diagnosis not present

## 2024-02-24 DIAGNOSIS — Z79899 Other long term (current) drug therapy: Secondary | ICD-10-CM | POA: Diagnosis not present

## 2024-02-24 DIAGNOSIS — I82612 Acute embolism and thrombosis of superficial veins of left upper extremity: Secondary | ICD-10-CM | POA: Diagnosis not present

## 2024-02-24 DIAGNOSIS — I428 Other cardiomyopathies: Secondary | ICD-10-CM | POA: Diagnosis not present

## 2024-02-24 DIAGNOSIS — R29818 Other symptoms and signs involving the nervous system: Secondary | ICD-10-CM

## 2024-02-24 DIAGNOSIS — I5082 Biventricular heart failure: Secondary | ICD-10-CM | POA: Insufficient documentation

## 2024-02-24 DIAGNOSIS — F1911 Other psychoactive substance abuse, in remission: Secondary | ICD-10-CM | POA: Diagnosis not present

## 2024-02-24 DIAGNOSIS — Z7901 Long term (current) use of anticoagulants: Secondary | ICD-10-CM | POA: Diagnosis not present

## 2024-02-24 DIAGNOSIS — I513 Intracardiac thrombosis, not elsewhere classified: Secondary | ICD-10-CM | POA: Insufficient documentation

## 2024-02-24 DIAGNOSIS — R059 Cough, unspecified: Secondary | ICD-10-CM | POA: Insufficient documentation

## 2024-02-24 DIAGNOSIS — I5022 Chronic systolic (congestive) heart failure: Secondary | ICD-10-CM | POA: Insufficient documentation

## 2024-02-24 DIAGNOSIS — I2699 Other pulmonary embolism without acute cor pulmonale: Secondary | ICD-10-CM | POA: Diagnosis not present

## 2024-02-24 LAB — BASIC METABOLIC PANEL WITH GFR
Anion gap: 10 (ref 5–15)
BUN: 13 mg/dL (ref 6–20)
CO2: 23 mmol/L (ref 22–32)
Calcium: 8.5 mg/dL — ABNORMAL LOW (ref 8.9–10.3)
Chloride: 104 mmol/L (ref 98–111)
Creatinine, Ser: 0.94 mg/dL (ref 0.61–1.24)
GFR, Estimated: >60 mL/min (ref >60)
Glucose, Bld: 127 mg/dL — ABNORMAL HIGH (ref 70–99)
Potassium: 4.4 mmol/L (ref 3.5–5.1)
Sodium: 137 mmol/L (ref 135–145)

## 2024-02-24 LAB — CBC
HCT: 34.6 % — ABNORMAL LOW (ref 39.0–52.0)
Hemoglobin: 11.1 g/dL — ABNORMAL LOW (ref 13.0–17.0)
MCH: 31.4 pg (ref 26.0–34.0)
MCHC: 32.1 g/dL (ref 30.0–36.0)
MCV: 97.7 fL (ref 80.0–100.0)
Platelets: 676 K/uL — ABNORMAL HIGH (ref 150–400)
RBC: 3.54 MIL/uL — ABNORMAL LOW (ref 4.22–5.81)
RDW: 13.3 % (ref 11.5–15.5)
WBC: 8.7 K/uL (ref 4.0–10.5)
nRBC: 0 % (ref 0.0–0.2)

## 2024-02-24 LAB — BRAIN NATRIURETIC PEPTIDE: B Natriuretic Peptide: 603.3 pg/mL — ABNORMAL HIGH (ref 0.0–100.0)

## 2024-02-24 MED ORDER — DIGOXIN 125 MCG PO TABS: 0.1250 mg | ORAL_TABLET | Freq: Every day | ORAL | 0 refills | Status: DC

## 2024-02-24 MED ORDER — DAPAGLIFLOZIN PROPANEDIOL 10 MG PO TABS: 10.0000 mg | ORAL_TABLET | Freq: Every day | ORAL | 3 refills | Status: AC

## 2024-02-24 NOTE — Progress Notes (Signed)
 ReDS Vest / Clip - 02/24/24 1000       ReDS Vest / Clip   Station Marker D    Ruler Value 34    ReDS Value Range Low volume    ReDS Actual Value 32

## 2024-02-24 NOTE — Patient Instructions (Addendum)
 Good to see you today!  RESTART Farxiga  10 mg daily  RESTART Digoxin  0.125 mg daily  Labs done today, your results will be available in MyChart, we will contact you for abnormal readings.  Your physician recommends that you schedule a follow-up appointment as scheduled  Hold digoxin  the day of your appointment please  If you have any questions or concerns before your next appointment please send us  a message through [place] or call our office at [phone].    TO LEAVE A MESSAGE FOR THE NURSE SELECT OPTION 2, PLEASE LEAVE A MESSAGE INCLUDING: YOUR NAME DATE OF BIRTH CALL BACK NUMBER REASON FOR CALL**this is important as we prioritize the call backs  YOU WILL RECEIVE A CALL BACK THE SAME DAY AS LONG AS YOU CALL BEFORE 4:00 PM At the [facility], you and your health needs are our priority. As part of our continuing mission to provide you with exceptional heart care, we have created designated Provider Care Teams. These Care Teams include your primary Cardiologist (physician) and Advanced Practice Providers (APPs- Physician Assistants and Nurse Practitioners) who all work together to provide you with the care you need, when you need it.   You may see any of the following providers on your designated Care Team at your next follow up: Dr Toribio Fuel Dr Ezra Shuck Dr. Ria Commander Dr. Morene Brownie Amy Lenetta, NP Caffie Shed, [place] [facility] [place], [place] Beckey Coe, NP [place] Lee, NP Ellouise Class, NP Tinnie Redman, PharmD Jaun Bash, PharmD   Please be sure to bring in all your medications bottles to every appointment.    Thank you for choosing [facility]

## 2024-02-25 ENCOUNTER — Ambulatory Visit (INDEPENDENT_AMBULATORY_CARE_PROVIDER_SITE_OTHER)

## 2024-02-25 VITALS — BP 108/90 | HR 67 | Temp 97.7°F | Ht 78.0 in | Wt 218.8 lb

## 2024-02-25 DIAGNOSIS — I2699 Other pulmonary embolism without acute cor pulmonale: Secondary | ICD-10-CM | POA: Diagnosis not present

## 2024-02-25 DIAGNOSIS — R079 Chest pain, unspecified: Secondary | ICD-10-CM

## 2024-02-25 DIAGNOSIS — I5020 Unspecified systolic (congestive) heart failure: Secondary | ICD-10-CM

## 2024-02-25 DIAGNOSIS — R000 Tachycardia, unspecified: Secondary | ICD-10-CM

## 2024-02-25 MED ORDER — BENZONATATE 100 MG PO CAPS: 100.0000 mg | ORAL_CAPSULE | Freq: Three times a day (TID) | ORAL | 0 refills | Status: AC | PRN

## 2024-02-25 MED ORDER — BENZONATATE 100 MG PO CAPS: 100.0000 mg | ORAL_CAPSULE | Freq: Three times a day (TID) | ORAL | 0 refills | Status: DC | PRN

## 2024-02-25 NOTE — Assessment & Plan Note (Signed)
 Unclear etiology of his left sided chest pain with worsening cough. COULD BE HIS RECENT PE, could be his right ventricular thrombus, could be developing pneumonia.  EKG done in office as reviewed by me showed sinus tachycardia with ventricular rate of 130 which seems to be his baseline.  Signs of LVH, left atrial enlargement. No change from yesterday's EKG. Does not appear to have a new MI  Will order chest x ray to rule out pneumonia.  Advised to go to the ED if any worsening pain. Prescribed tessalon  perles for his reported cough as he strongly feels they will help.
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
## 2024-02-25 NOTE — Progress Notes (Unsigned)
 Subjective:  Patient ID: Anthony Singleton, male    DOB: 25-May-1971  Age: 53 y.o. MRN: [id number]  Chief Complaint  Patient presents with  . Establish Care    HPI:  Patient is establishing as a new patient.  Discussed the use of AI scribe software for clinical note transcription with the patient, who gave verbal consent to proceed.          02/25/2024   11:20 AM 01/19/2019    1:47 PM  Depression screen PHQ 2/9  Decreased Interest 0 2  Down, Depressed, Hopeless 0 2  PHQ - 2 Score 0 4  Altered sleeping  2  Tired, decreased energy  2  Change in appetite  2  Feeling bad or failure about yourself   2  Trouble concentrating  2  Moving slowly or fidgety/restless  2  Suicidal thoughts  2  PHQ-9 Score  18  Difficult doing work/chores  Extremely dIfficult         10/26/2017    3:30 PM 01/20/2018    8:58 AM 04/12/2018    9:09 AM 01/19/2019    1:47 PM 02/25/2024   11:20 AM  Fall Risk  Falls in the past year?    0  0  Was there an injury with Fall?     0  Fall Risk Category Calculator     0  Fall Risk Category (Retired)       Patient at Risk for Falls Due to     No Fall Risks  Fall risk Follow up     Falls evaluation completed     Information is confidential and restricted. Go to Review Flowsheets to unlock data.   Data saved with a previous flowsheet row definition     Current Outpatient Medications on File Prior to Visit  Medication Sig Dispense Refill  . acetaminophen  (TYLENOL ) 500 MG tablet Take 1,000 mg by mouth 2 (two) times daily as needed for moderate pain (pain score 4-6), headache or fever.    . dapagliflozin  propanediol (FARXIGA ) 10 MG TABS tablet Take 1 tablet (10 mg total) by mouth daily. 90 tablet 3  . folic acid  (FOLVITE ) 1 MG tablet Take 1 tablet (1 mg total) by mouth daily. 30 tablet 0  . gabapentin  (NEURONTIN ) 300 MG capsule Take 1 capsule (300 mg total) by mouth at bedtime. 30 capsule 0  . Multiple Vitamins-Minerals (CERTAVITE/ANTIOXIDANTS) TABS  Take 1 tablet by mouth daily.    . polyethylene glycol powder (GLYCOLAX /MIRALAX ) 17 GM/SCOOP powder Take 1 capful (17 g) and mix with 8 oz of water . Take mixture by mouth once daily. 238 g 0  . sacubitril -valsartan  (ENTRESTO ) 49-51 MG Take 1 tablet by mouth 2 (two) times daily. 60 tablet 0  . senna-docusate (SENOKOT-S) 8.6-50 MG tablet Take 2 tablets by mouth at bedtime.    . spironolactone  (ALDACTONE ) 25 MG tablet Take 1 tablet (25 mg total) by mouth daily. 30 tablet 2  . rosuvastatin  (CRESTOR ) 10 MG tablet Take 1 tablet (10 mg total) by mouth daily. (Patient not taking: Reported on 02/25/2024) 30 tablet 0  . warfarin (COUMADIN ) 5 MG tablet Take 1 tablet (5 mg total) by mouth daily. (Patient not taking: Reported on 02/25/2024) 30 tablet 0   No current facility-administered medications on file prior to visit.  . Social History   Socioeconomic History  . Marital status: Married    Spouse name: Not on file  . Number of children: Not on file  . Years of education:  Not on file  . Highest education level: Not on file  Occupational History  . Not on file  Tobacco Use  . Smoking status: Never  . Smokeless tobacco: Never  Vaping Use  . Vaping status: Never Used  Substance and Sexual Activity  . Alcohol use: No  . Drug use: Yes    Types: Amphetamines, Marijuana    Comment: crack pipe found on person, + for amphetamines, THC  . Sexual activity: Yes  Other Topics Concern  . Not on file  Social History Narrative  . Not on file   Social Drivers of Health   Financial Resource Strain: Not on file  Food Insecurity: No Food Insecurity (02/08/2024)   Hunger Vital Sign   . Worried About Programme researcher, broadcasting/film/video in the Last Year: Never true   . Ran Out of Food in the Last Year: Never true  Transportation Needs: No Transportation Needs (02/08/2024)   PRAPARE - Transportation   . Lack of Transportation (Medical): No   . Lack of Transportation (Non-Medical): No  Physical Activity: Not on file   Stress: Not on file  Social Connections: Not on file   Past Medical History:  Diagnosis Date  . Anxiety disorder    hight levels of stress/does not like medications  . Gilbert's syndrome   . 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 Tinnitus, left    Family History  Problem Relation Age of Onset  . Stroke Mother   . Stroke Father     Review of Systems  Constitutional:  Negative for chills, fatigue and fever.  HENT:  Negative for congestion, ear pain, sinus pressure and sore throat.   Respiratory:  Negative for cough and shortness of breath.   Cardiovascular:  Negative for chest pain.  Gastrointestinal:  Negative for abdominal pain, constipation, diarrhea, nausea and vomiting.  Genitourinary:  Negative for dysuria and frequency.  Musculoskeletal:  Negative for arthralgias, back pain and myalgias.  Neurological:  Negative for dizziness and headaches.  Psychiatric/Behavioral:  Negative for dysphoric mood. The patient is not nervous/anxious.      Objective:  BP (!) 108/90   Pulse 67   Temp 97.7 F (36.5 C)   Ht 6' 6 (1.981 m)   Wt 218 lb 12.8 oz (99.2 kg)   SpO2 98%   BMI 25.28 kg/m      02/25/2024   11:15 AM 02/24/2024    9:30 AM 02/18/2024    3:15 PM  BP/Weight  Systolic BP 108 104 91  Diastolic BP 90 84 69  Wt. (Lbs) 218.8 219   BMI 25.28 kg/m2 25.31 kg/m2     Physical Exam {Perform Simple Foot Exam  Perform Detailed exam:1} {Insert foot Exam (Optional):30965}   Lab Results  Component Value Date   WBC 8.7 02/24/2024   HGB 11.1 (L) 02/24/2024   HCT 34.6 (L) 02/24/2024   PLT 676 (H) 02/24/2024   GLUCOSE 127 (H) 02/24/2024   CHOL 157 07/15/2023   TRIG 86 07/15/2023   HDL 34 (L) 07/15/2023   LDLDIRECT 119 (H) 07/15/2023   LDLCALC 106 (H) 07/15/2023   ALT 16 02/14/2024   AST 18 02/14/2024   NA 137 02/24/2024   K 4.4 02/24/2024   CL 104 02/24/2024   CREATININE 0.94 02/24/2024   BUN 13 02/24/2024   CO2 23 02/24/2024   TSH 1.409 07/08/2023   INR  5.6 (A) 02/23/2024   HGBA1C 5.7 (H) 07/16/2023  Assessment & Plan:  Left-sided chest pain -     DG Chest 2 View; Future  Other orders -     Benzonatate ; Take 1 capsule (100 mg total) by mouth 3 (three) times daily as needed for up to 7 days for cough.  Dispense: 21 capsule; Refill: 0      Meds ordered this encounter  Medications  . DISCONTD: benzonatate  (TESSALON ) 100 MG capsule    Sig: Take 1 capsule (100 mg total) by mouth 3 (three) times daily as needed for up to 7 days for cough.    Dispense:  21 capsule    Refill:  0  . benzonatate  (TESSALON ) 100 MG capsule    Sig: Take 1 capsule (100 mg total) by mouth 3 (three) times daily as needed for up to 7 days for cough.    Dispense:  21 capsule    Refill:  0   Orders Placed This Encounter  Procedures  . DG Chest 2 View     Assessment and Plan    .   Follow-up: Return for chronic disease follow up.  AVS was given to patient prior to departure.  Tommy Sch[facility]ke [facility] [phone]

## 2024-02-26 NOTE — Assessment & Plan Note (Signed)
 Heart failure with reduced ejection fraction and RV apical thrombus measuring 1. 9x0. 9cm  Chronic heart failure with reduced ejection fraction, currently less than 20%, with a history of intracardiac thrombus. Symptoms include severe fatigue, shortness of breath, and dizziness. Recent echocardiogram showed global hypokinesis and diastolic dysfunction. BNP improved from 2000s to 600. Heart rate elevated at 129 bpm. - Order chest x-ray to evaluate for pneumonia or fluid in lungs - Start digoxin  as prescribed. OTHERWISE, CONTINUE GUIDELINE DIRECTED MEDICAL THERAPY HE FOLLOWS UP WITH HEART FAILURE CLINIC AND he had a visit yesterday.  - Seek emergency care if symptoms worsen

## 2024-02-26 NOTE — Assessment & Plan Note (Addendum)
 Recent PE  as of CT angiogram of lungs done on 02/08/24, he had Positive for acute segmental PE with in the medial basilar segmental artery of the left lower lobe. Also had  upper extremity deep and superficial vein thromboses  Has been changed from eliquis  to warfarin due to recurrent PE. But had supra therapeutic INR at 5.6, on 02/23/24 due to which warfarin is on hold at this time.  Will monitor

## 2024-02-26 NOTE — Assessment & Plan Note (Addendum)
 Persistent tachycardia with heart rate consistently between 126-130 bpm. Symptoms include palpitations and chest pain. EKG shows ventricular rate of 130.. Tachycardia may be related to intracardiac thrombus or pulmonary embolism OR COULD BE A CHRONIC FINDING   Not a candidate for beta blockade due to his severely low cardiac function, but will defer it to cardiology.

## 2024-03-01 ENCOUNTER — Ambulatory Visit: Attending: Cardiology

## 2024-03-01 DIAGNOSIS — I2699 Other pulmonary embolism without acute cor pulmonale: Secondary | ICD-10-CM

## 2024-03-01 DIAGNOSIS — Z7901 Long term (current) use of anticoagulants: Secondary | ICD-10-CM

## 2024-03-01 LAB — POCT INR: INR: 2.4 (ref 2.0–3.0)

## 2024-03-01 NOTE — Patient Instructions (Signed)
 Continue 0.5 tablet Daily, except 1 tablet every Monday, Wednesday and Friday.  INR in 1 week.  [id number]
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
## 2024-03-08 ENCOUNTER — Ambulatory Visit: Attending: Cardiology

## 2024-03-08 ENCOUNTER — Telehealth (HOSPITAL_COMMUNITY): Payer: Self-pay

## 2024-03-08 DIAGNOSIS — I2699 Other pulmonary embolism without acute cor pulmonale: Secondary | ICD-10-CM

## 2024-03-08 DIAGNOSIS — Z7901 Long term (current) use of anticoagulants: Secondary | ICD-10-CM

## 2024-03-08 LAB — POCT INR: INR: 2.9 (ref 2.0–3.0)

## 2024-03-08 NOTE — Patient Instructions (Signed)
 Continue 0.5 tablet Daily, except 1 tablet every Monday, Wednesday and Friday.  INR in 2 week.  [phone]

## 2024-03-08 NOTE — Telephone Encounter (Signed)
 Called to confirm/remind patient of their appointment at the [facility] on 03/09/24. However, pt rescheduled for another day.

## 2024-03-09 ENCOUNTER — Encounter (HOSPITAL_COMMUNITY)

## 2024-03-09 ENCOUNTER — Ambulatory Visit

## 2024-03-22 ENCOUNTER — Ambulatory Visit: Attending: Cardiology

## 2024-03-22 ENCOUNTER — Other Ambulatory Visit: Payer: Self-pay

## 2024-03-22 DIAGNOSIS — I2699 Other pulmonary embolism without acute cor pulmonale: Secondary | ICD-10-CM

## 2024-03-22 DIAGNOSIS — Z7901 Long term (current) use of anticoagulants: Secondary | ICD-10-CM

## 2024-03-22 LAB — POCT INR: INR: 1.8 — AB (ref 2.0–3.0)

## 2024-03-22 MED ORDER — WARFARIN SODIUM 5 MG PO TABS: 5.0000 mg | ORAL_TABLET | Freq: Every day | ORAL | 1 refills | Status: AC

## 2024-03-22 NOTE — Patient Instructions (Signed)
 Take 1 tablet today only then Continue 0.5 tablet Daily, except 1 tablet every Monday, Wednesday and Friday.  INR in 4 week.  [phone]

## 2024-03-25 ENCOUNTER — Telehealth (HOSPITAL_COMMUNITY): Payer: Self-pay

## 2024-03-25 NOTE — Telephone Encounter (Signed)
 Called to confirm/remind patient of their appointment at the [facility] on 02/26/24.   Appointment:   [] Confirmed  [x] Left mess   [] No answer/No voice mail  [] VM Full/unable to leave message  [] Phone not in service  And to bring in all medications and/or complete list.

## 2024-03-28 ENCOUNTER — Encounter (HOSPITAL_COMMUNITY): Payer: Self-pay

## 2024-03-28 ENCOUNTER — Ambulatory Visit (HOSPITAL_COMMUNITY)
Admission: RE | Admit: 2024-03-28 | Discharge: 2024-03-28 | Disposition: A | Source: Ambulatory Visit | Attending: Family Medicine

## 2024-03-28 ENCOUNTER — Ambulatory Visit (HOSPITAL_COMMUNITY): Payer: Self-pay | Admitting: Family Medicine

## 2024-03-28 VITALS — BP 136/100 | HR 123 | Wt 210.2 lb

## 2024-03-28 DIAGNOSIS — I5022 Chronic systolic (congestive) heart failure: Secondary | ICD-10-CM | POA: Insufficient documentation

## 2024-03-28 DIAGNOSIS — Z79899 Other long term (current) drug therapy: Secondary | ICD-10-CM | POA: Diagnosis not present

## 2024-03-28 DIAGNOSIS — F1911 Other psychoactive substance abuse, in remission: Secondary | ICD-10-CM

## 2024-03-28 DIAGNOSIS — Z86718 Personal history of other venous thrombosis and embolism: Secondary | ICD-10-CM | POA: Insufficient documentation

## 2024-03-28 DIAGNOSIS — Z8249 Family history of ischemic heart disease and other diseases of the circulatory system: Secondary | ICD-10-CM | POA: Diagnosis not present

## 2024-03-28 DIAGNOSIS — Z87891 Personal history of nicotine dependence: Secondary | ICD-10-CM | POA: Diagnosis not present

## 2024-03-28 DIAGNOSIS — I5082 Biventricular heart failure: Secondary | ICD-10-CM | POA: Diagnosis not present

## 2024-03-28 DIAGNOSIS — Z87820 Personal history of traumatic brain injury: Secondary | ICD-10-CM | POA: Diagnosis not present

## 2024-03-28 DIAGNOSIS — R29818 Other symptoms and signs involving the nervous system: Secondary | ICD-10-CM

## 2024-03-28 DIAGNOSIS — Z7901 Long term (current) use of anticoagulants: Secondary | ICD-10-CM | POA: Insufficient documentation

## 2024-03-28 DIAGNOSIS — Z86711 Personal history of pulmonary embolism: Secondary | ICD-10-CM | POA: Insufficient documentation

## 2024-03-28 DIAGNOSIS — R059 Cough, unspecified: Secondary | ICD-10-CM

## 2024-03-28 DIAGNOSIS — I2699 Other pulmonary embolism without acute cor pulmonale: Secondary | ICD-10-CM | POA: Diagnosis not present

## 2024-03-28 LAB — BASIC METABOLIC PANEL WITH GFR
Anion gap: 11 (ref 5–15)
BUN: 10 mg/dL (ref 6–20)
CO2: 24 mmol/L (ref 22–32)
Calcium: 9 mg/dL (ref 8.9–10.3)
Chloride: 102 mmol/L (ref 98–111)
Creatinine, Ser: 0.98 mg/dL (ref 0.61–1.24)
GFR, Estimated: >60 mL/min (ref >60)
Glucose, Bld: 114 mg/dL — ABNORMAL HIGH (ref 70–99)
Potassium: 4.4 mmol/L (ref 3.5–5.1)
Sodium: 137 mmol/L (ref 135–145)

## 2024-03-28 LAB — BRAIN NATRIURETIC PEPTIDE: B Natriuretic Peptide: 282.6 pg/mL — ABNORMAL HIGH (ref 0.0–100.0)

## 2024-03-28 MED ORDER — SPIRONOLACTONE 25 MG PO TABS: 12.5000 mg | ORAL_TABLET | Freq: Every day | ORAL | 11 refills | Status: DC

## 2024-03-28 NOTE — Patient Instructions (Addendum)
 Thank you for coming in today  If you had labs drawn today, any labs that are abnormal the clinic will call you No news is good news  You have been referred to sleep medicine  , their office will call you for further appointment details   You will be called for PFT test to schedule Medications: Start Spironolactone  12.5 mg 1/2 tablet daily    Follow up appointments:  Your physician recommends that you schedule a follow-up appointment in:  3 weeks in pharmacy  3 months With Dr. Rolan with echocardiogram  Please call our office to schedule the follow-up appointment in December 2025 for February 2026.   Your physician recommends that you return for lab work in: 1 week for BMET   Do the following things EVERYDAY: Weigh yourself in the morning before breakfast. Write it down and keep it in a log. Take your medicines as prescribed Eat low salt foods--Limit salt (sodium) to 2000 mg per day.  Stay as active as you can everyday Limit all fluids for the day to less than 2 liters   At the [facility], you and your health needs are our priority. As part of our continuing mission to provide you with exceptional heart care, we have created designated Provider Care Teams. These Care Teams include your primary Cardiologist (physician) and Advanced Practice Providers (APPs- Physician Assistants and Nurse Practitioners) who all work together to provide you with the care you need, when you need it.   You may see any of the following providers on your designated Care Team at your next follow up: Dr Toribio Fuel Dr Ezra Rolan Dr. Ria Gardenia Greig Lenetta, NP Caffie Shed, [place] [facility] [place], [place] Beckey Coe, NP Tinnie Redman, PharmD   Please be sure to bring in all your medications bottles to every appointment.    Thank you for choosing [facility]  If you have any questions or concerns before your next  appointment please send us  a message through [place] or call our office at [phone].    TO LEAVE A MESSAGE FOR THE NURSE SELECT OPTION 2, PLEASE LEAVE A MESSAGE INCLUDING: YOUR NAME DATE OF BIRTH CALL BACK NUMBER REASON FOR CALL**this is important as we prioritize the call backs  YOU WILL RECEIVE A CALL BACK THE SAME DAY AS LONG AS YOU CALL BEFORE 4:00 PM
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
## 2024-04-04 ENCOUNTER — Ambulatory Visit (HOSPITAL_COMMUNITY)

## 2024-04-06 ENCOUNTER — Ambulatory Visit (HOSPITAL_COMMUNITY)
Admission: RE | Admit: 2024-04-06 | Discharge: 2024-04-06 | Disposition: A | Discharge status: Home / Self Care | Source: Ambulatory Visit | Attending: Internal Medicine | Admitting: Internal Medicine

## 2024-04-06 DIAGNOSIS — I5022 Chronic systolic (congestive) heart failure: Secondary | ICD-10-CM | POA: Diagnosis not present

## 2024-04-06 LAB — BASIC METABOLIC PANEL WITH GFR
Anion gap: 12 (ref 5–15)
BUN: 6 mg/dL (ref 6–20)
CO2: 26 mmol/L (ref 22–32)
Calcium: 9.5 mg/dL (ref 8.9–10.3)
Chloride: 104 mmol/L (ref 98–111)
Creatinine, Ser: 0.99 mg/dL (ref 0.61–1.24)
GFR, Estimated: >60 mL/min (ref >60)
Glucose, Bld: 110 mg/dL — ABNORMAL HIGH (ref 70–99)
Potassium: 4.7 mmol/L (ref 3.5–5.1)
Sodium: 142 mmol/L (ref 135–145)

## 2024-04-06 LAB — DIGOXIN LEVEL: Digoxin Level: <0.6 ng/mL — ABNORMAL LOW (ref 0.8–2.0)

## 2024-04-19 ENCOUNTER — Ambulatory Visit
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
## 2024-04-25 ENCOUNTER — Ambulatory Visit (HOSPITAL_COMMUNITY)
Admission: RE | Admit: 2024-04-25 | Discharge: 2024-04-25 | Disposition: A | Discharge status: Home / Self Care | Source: Ambulatory Visit

## 2024-04-26 ENCOUNTER — Ambulatory Visit

## 2024-05-03 ENCOUNTER — Ambulatory Visit: Attending: Cardiology

## 2024-05-03 DIAGNOSIS — Z7901 Long term (current) use of anticoagulants: Secondary | ICD-10-CM | POA: Diagnosis not present

## 2024-05-03 DIAGNOSIS — I2699 Other pulmonary embolism without acute cor pulmonale: Secondary | ICD-10-CM | POA: Diagnosis not present

## 2024-05-03 LAB — POCT INR: INR: 1 — AB (ref 2.0–3.0)

## 2024-05-03 NOTE — Patient Instructions (Signed)
 Take 1 tablet today and 2 tablets Wednesday only then Continue 0.5 tablet Daily, except 1 tablet every Monday, Wednesday and Friday.  INR in 2 weeks.  [phone]

## 2024-05-17 ENCOUNTER — Ambulatory Visit

## 2024-05-24 ENCOUNTER — Ambulatory Visit

## 2024-05-25 NOTE — Addendum Note (Signed)
 Encounter addended by: Serena Morna SAUNDERS, [facility] on: 05/25/2024 8:02 PM  Actions taken: Delete clinical note

## 2024-05-26 ENCOUNTER — Inpatient Hospital Stay (HOSPITAL_COMMUNITY)
Admission: EM | Admit: 2024-05-26 | Discharge: 2024-05-31 | DRG: 286 | Disposition: A | Discharge status: Home / Self Care | Attending: Internal Medicine | Admitting: Internal Medicine

## 2024-05-26 ENCOUNTER — Other Ambulatory Visit: Payer: Self-pay

## 2024-05-26 ENCOUNTER — Emergency Department (HOSPITAL_COMMUNITY)

## 2024-05-26 ENCOUNTER — Encounter (HOSPITAL_COMMUNITY): Payer: Self-pay

## 2024-05-26 DIAGNOSIS — J9621 Acute and chronic respiratory failure with hypoxia: Secondary | ICD-10-CM | POA: Diagnosis present

## 2024-05-26 DIAGNOSIS — R000 Tachycardia, unspecified: Secondary | ICD-10-CM

## 2024-05-26 DIAGNOSIS — K920 Hematemesis: Secondary | ICD-10-CM | POA: Diagnosis present

## 2024-05-26 DIAGNOSIS — I110 Hypertensive heart disease with heart failure: Principal | ICD-10-CM | POA: Diagnosis present

## 2024-05-26 DIAGNOSIS — Z823 Family history of stroke: Secondary | ICD-10-CM

## 2024-05-26 DIAGNOSIS — Z79899 Other long term (current) drug therapy: Secondary | ICD-10-CM

## 2024-05-26 DIAGNOSIS — R7989 Other specified abnormal findings of blood chemistry: Secondary | ICD-10-CM | POA: Diagnosis present

## 2024-05-26 DIAGNOSIS — I2782 Chronic pulmonary embolism: Secondary | ICD-10-CM

## 2024-05-26 DIAGNOSIS — I5084 End stage heart failure: Secondary | ICD-10-CM | POA: Diagnosis present

## 2024-05-26 DIAGNOSIS — E871 Hypo-osmolality and hyponatremia: Secondary | ICD-10-CM | POA: Diagnosis present

## 2024-05-26 DIAGNOSIS — H9192 Unspecified hearing loss, left ear: Secondary | ICD-10-CM | POA: Diagnosis present

## 2024-05-26 DIAGNOSIS — A419 Sepsis, unspecified organism: Secondary | ICD-10-CM | POA: Diagnosis present

## 2024-05-26 DIAGNOSIS — J90 Pleural effusion, not elsewhere classified: Principal | ICD-10-CM

## 2024-05-26 DIAGNOSIS — I2489 Other forms of acute ischemic heart disease: Secondary | ICD-10-CM | POA: Diagnosis present

## 2024-05-26 DIAGNOSIS — S069XAS Unspecified intracranial injury with loss of consciousness status unknown, sequela: Secondary | ICD-10-CM

## 2024-05-26 DIAGNOSIS — R791 Abnormal coagulation profile: Secondary | ICD-10-CM | POA: Diagnosis present

## 2024-05-26 DIAGNOSIS — J189 Pneumonia, unspecified organism: Secondary | ICD-10-CM | POA: Diagnosis present

## 2024-05-26 DIAGNOSIS — R6510 Systemic inflammatory response syndrome (SIRS) of non-infectious origin without acute organ dysfunction: Secondary | ICD-10-CM | POA: Diagnosis present

## 2024-05-26 DIAGNOSIS — Z87898 Personal history of other specified conditions: Secondary | ICD-10-CM

## 2024-05-26 DIAGNOSIS — Z7901 Long term (current) use of anticoagulants: Secondary | ICD-10-CM

## 2024-05-26 DIAGNOSIS — I428 Other cardiomyopathies: Secondary | ICD-10-CM | POA: Diagnosis present

## 2024-05-26 DIAGNOSIS — Z86711 Personal history of pulmonary embolism: Secondary | ICD-10-CM

## 2024-05-26 DIAGNOSIS — I5082 Biventricular heart failure: Secondary | ICD-10-CM | POA: Diagnosis present

## 2024-05-26 DIAGNOSIS — Z8701 Personal history of pneumonia (recurrent): Secondary | ICD-10-CM

## 2024-05-26 DIAGNOSIS — D649 Anemia, unspecified: Secondary | ICD-10-CM | POA: Diagnosis present

## 2024-05-26 DIAGNOSIS — K2100 Gastro-esophageal reflux disease with esophagitis, without bleeding: Secondary | ICD-10-CM | POA: Diagnosis present

## 2024-05-26 DIAGNOSIS — K761 Chronic passive congestion of liver: Secondary | ICD-10-CM | POA: Diagnosis present

## 2024-05-26 DIAGNOSIS — Z1152 Encounter for screening for COVID-19: Secondary | ICD-10-CM

## 2024-05-26 DIAGNOSIS — R079 Chest pain, unspecified: Secondary | ICD-10-CM

## 2024-05-26 DIAGNOSIS — R561 Post traumatic seizures: Secondary | ICD-10-CM | POA: Diagnosis present

## 2024-05-26 DIAGNOSIS — R748 Abnormal levels of other serum enzymes: Secondary | ICD-10-CM | POA: Diagnosis present

## 2024-05-26 DIAGNOSIS — I5043 Acute on chronic combined systolic (congestive) and diastolic (congestive) heart failure: Secondary | ICD-10-CM | POA: Diagnosis present

## 2024-05-26 LAB — CBC
HCT: 38.1 % — ABNORMAL LOW (ref 39.0–52.0)
Hemoglobin: 12 g/dL — ABNORMAL LOW (ref 13.0–17.0)
MCH: 30.2 pg (ref 26.0–34.0)
MCHC: 31.5 g/dL (ref 30.0–36.0)
MCV: 95.7 fL (ref 80.0–100.0)
Platelets: 180 K/uL (ref 150–400)
RBC: 3.98 MIL/uL — ABNORMAL LOW (ref 4.22–5.81)
RDW: 15.9 % — ABNORMAL HIGH (ref 11.5–15.5)
WBC: 9.9 K/uL (ref 4.0–10.5)
nRBC: 0 % (ref 0.0–0.2)

## 2024-05-26 LAB — BASIC METABOLIC PANEL WITH GFR
Anion gap: 10 (ref 5–15)
BUN: 15 mg/dL (ref 6–20)
CO2: 21 mmol/L — ABNORMAL LOW (ref 22–32)
Calcium: 8.4 mg/dL — ABNORMAL LOW (ref 8.9–10.3)
Chloride: 103 mmol/L (ref 98–111)
Creatinine, Ser: 0.91 mg/dL (ref 0.61–1.24)
GFR, Estimated: >60 mL/min
Glucose, Bld: 110 mg/dL — ABNORMAL HIGH (ref 70–99)
Potassium: 4.6 mmol/L (ref 3.5–5.1)
Sodium: 134 mmol/L — ABNORMAL LOW (ref 135–145)

## 2024-05-26 LAB — TROPONIN T, HIGH SENSITIVITY: Troponin T High Sensitivity: 27 ng/L — ABNORMAL HIGH (ref 0–19)

## 2024-05-26 NOTE — ED Triage Notes (Addendum)
 QTN: Reports short of breath and coughing up blood. States he has been having chest pain for 2 days. History of heart failure. Patient placed in a wheelchair.

## 2024-05-26 NOTE — ED Provider Triage Note (Signed)
 Emergency Medicine Provider Triage Evaluation Note  Anthony Singleton , a 54 y.o. male  was evaluated in triage.  Pt complains of chest pain, shortness of breath and hemoptysis for the last 2 to 3 days.  Patient states that he has a history of heart failure and he takes warfarin every day.  His last dose was this morning around 8 AM.  Patient states that he has been coughing up approximately 1 cup of blood for the last 2 to 3 days as well.  He denies any URI or flulike symptoms.  Review of Systems  Positive: Hemoptysis, shortness of breath, chest pain Negative:   Physical Exam  BP (!) 135/112 (BP Location: Right Arm)   Pulse (!) 134   Temp 99.3 F (37.4 C)   Resp (!) 25   SpO2 100%  Gen:   Awake, no distress   Resp:  Normal effort, lung sounds clear MSK:   Moves extremities without difficulty  Other:    Medical Decision Making  Medically screening exam initiated at 9:43 PM.  Appropriate orders placed.  Anthony Singleton was informed that the remainder of the evaluation will be completed by another provider, this initial triage assessment does not replace that evaluation, and the importance of remaining in the ED until their evaluation is complete.  Labs, EKG, respiratory panel ordered.   Anthony Marry RAMAN, PA-C 05/26/24 2150

## 2024-05-26 NOTE — ED Triage Notes (Signed)
 Pt to ED c/o SHOB and CP, Cough x 3 days, reports blood in mucous when coughing . Reports hx CHF, takes blood thinner

## 2024-05-27 ENCOUNTER — Emergency Department (HOSPITAL_COMMUNITY)

## 2024-05-27 DIAGNOSIS — J189 Pneumonia, unspecified organism: Secondary | ICD-10-CM | POA: Diagnosis present

## 2024-05-27 DIAGNOSIS — R7989 Other specified abnormal findings of blood chemistry: Secondary | ICD-10-CM | POA: Diagnosis not present

## 2024-05-27 DIAGNOSIS — R6510 Systemic inflammatory response syndrome (SIRS) of non-infectious origin without acute organ dysfunction: Secondary | ICD-10-CM | POA: Diagnosis not present

## 2024-05-27 DIAGNOSIS — Z1152 Encounter for screening for COVID-19: Secondary | ICD-10-CM | POA: Diagnosis not present

## 2024-05-27 DIAGNOSIS — K2100 Gastro-esophageal reflux disease with esophagitis, without bleeding: Secondary | ICD-10-CM | POA: Diagnosis present

## 2024-05-27 DIAGNOSIS — Z87898 Personal history of other specified conditions: Secondary | ICD-10-CM

## 2024-05-27 DIAGNOSIS — Z823 Family history of stroke: Secondary | ICD-10-CM | POA: Diagnosis not present

## 2024-05-27 DIAGNOSIS — D649 Anemia, unspecified: Secondary | ICD-10-CM | POA: Diagnosis present

## 2024-05-27 DIAGNOSIS — I5043 Acute on chronic combined systolic (congestive) and diastolic (congestive) heart failure: Secondary | ICD-10-CM | POA: Diagnosis present

## 2024-05-27 DIAGNOSIS — E871 Hypo-osmolality and hyponatremia: Secondary | ICD-10-CM | POA: Diagnosis present

## 2024-05-27 DIAGNOSIS — R748 Abnormal levels of other serum enzymes: Secondary | ICD-10-CM | POA: Diagnosis not present

## 2024-05-27 DIAGNOSIS — R561 Post traumatic seizures: Secondary | ICD-10-CM | POA: Diagnosis present

## 2024-05-27 DIAGNOSIS — I110 Hypertensive heart disease with heart failure: Secondary | ICD-10-CM | POA: Diagnosis present

## 2024-05-27 DIAGNOSIS — I2782 Chronic pulmonary embolism: Secondary | ICD-10-CM | POA: Diagnosis not present

## 2024-05-27 DIAGNOSIS — A419 Sepsis, unspecified organism: Secondary | ICD-10-CM | POA: Diagnosis present

## 2024-05-27 DIAGNOSIS — Z7901 Long term (current) use of anticoagulants: Secondary | ICD-10-CM | POA: Diagnosis not present

## 2024-05-27 DIAGNOSIS — R042 Hemoptysis: Secondary | ICD-10-CM | POA: Diagnosis not present

## 2024-05-27 DIAGNOSIS — I5023 Acute on chronic systolic (congestive) heart failure: Secondary | ICD-10-CM | POA: Diagnosis not present

## 2024-05-27 DIAGNOSIS — R791 Abnormal coagulation profile: Secondary | ICD-10-CM | POA: Diagnosis present

## 2024-05-27 DIAGNOSIS — I5084 End stage heart failure: Secondary | ICD-10-CM | POA: Diagnosis present

## 2024-05-27 DIAGNOSIS — J9621 Acute and chronic respiratory failure with hypoxia: Secondary | ICD-10-CM | POA: Diagnosis present

## 2024-05-27 DIAGNOSIS — I5021 Acute systolic (congestive) heart failure: Secondary | ICD-10-CM | POA: Diagnosis not present

## 2024-05-27 DIAGNOSIS — Z79899 Other long term (current) drug therapy: Secondary | ICD-10-CM | POA: Diagnosis not present

## 2024-05-27 DIAGNOSIS — I2489 Other forms of acute ischemic heart disease: Secondary | ICD-10-CM | POA: Diagnosis present

## 2024-05-27 DIAGNOSIS — Z86711 Personal history of pulmonary embolism: Secondary | ICD-10-CM

## 2024-05-27 DIAGNOSIS — K761 Chronic passive congestion of liver: Secondary | ICD-10-CM | POA: Diagnosis present

## 2024-05-27 DIAGNOSIS — I5082 Biventricular heart failure: Secondary | ICD-10-CM | POA: Diagnosis present

## 2024-05-27 DIAGNOSIS — R000 Tachycardia, unspecified: Secondary | ICD-10-CM | POA: Diagnosis not present

## 2024-05-27 DIAGNOSIS — J90 Pleural effusion, not elsewhere classified: Secondary | ICD-10-CM | POA: Diagnosis not present

## 2024-05-27 DIAGNOSIS — J9601 Acute respiratory failure with hypoxia: Secondary | ICD-10-CM | POA: Diagnosis not present

## 2024-05-27 DIAGNOSIS — K920 Hematemesis: Secondary | ICD-10-CM | POA: Diagnosis present

## 2024-05-27 DIAGNOSIS — H9192 Unspecified hearing loss, left ear: Secondary | ICD-10-CM | POA: Diagnosis present

## 2024-05-27 DIAGNOSIS — I428 Other cardiomyopathies: Secondary | ICD-10-CM | POA: Diagnosis present

## 2024-05-27 DIAGNOSIS — J810 Acute pulmonary edema: Secondary | ICD-10-CM | POA: Diagnosis not present

## 2024-05-27 LAB — BASIC METABOLIC PANEL WITH GFR
Anion gap: 11 (ref 5–15)
BUN: 14 mg/dL (ref 6–20)
CO2: 22 mmol/L (ref 22–32)
Calcium: 8.5 mg/dL — ABNORMAL LOW (ref 8.9–10.3)
Chloride: 99 mmol/L (ref 98–111)
Creatinine, Ser: 0.94 mg/dL (ref 0.61–1.24)
GFR, Estimated: >60 mL/min
Glucose, Bld: 99 mg/dL (ref 70–99)
Potassium: 4.5 mmol/L (ref 3.5–5.1)
Sodium: 132 mmol/L — ABNORMAL LOW (ref 135–145)

## 2024-05-27 LAB — CBC WITH DIFFERENTIAL/PLATELET
Abs Immature Granulocytes: 0.04 K/uL (ref 0.00–0.07)
Basophils Absolute: 0 K/uL (ref 0.0–0.1)
Basophils Relative: 0 %
Eosinophils Absolute: 0 K/uL (ref 0.0–0.5)
Eosinophils Relative: 0 %
HCT: 36.4 % — ABNORMAL LOW (ref 39.0–52.0)
Hemoglobin: 11.4 g/dL — ABNORMAL LOW (ref 13.0–17.0)
Immature Granulocytes: 0 %
Lymphocytes Relative: 11 %
Lymphs Abs: 1.1 K/uL (ref 0.7–4.0)
MCH: 30.2 pg (ref 26.0–34.0)
MCHC: 31.3 g/dL (ref 30.0–36.0)
MCV: 96.3 fL (ref 80.0–100.0)
Monocytes Absolute: 1.1 K/uL — ABNORMAL HIGH (ref 0.1–1.0)
Monocytes Relative: 12 %
Neutro Abs: 7.4 K/uL (ref 1.7–7.7)
Neutrophils Relative %: 77 %
Platelets: 181 K/uL (ref 150–400)
RBC: 3.78 MIL/uL — ABNORMAL LOW (ref 4.22–5.81)
RDW: 15.9 % — ABNORMAL HIGH (ref 11.5–15.5)
WBC: 9.7 K/uL (ref 4.0–10.5)
nRBC: 0 % (ref 0.0–0.2)

## 2024-05-27 LAB — RESP PANEL BY RT-PCR (RSV, FLU A&B, COVID)  RVPGX2
Influenza A by PCR: NEGATIVE
Influenza B by PCR: NEGATIVE
Resp Syncytial Virus by PCR: NEGATIVE
SARS Coronavirus 2 by RT PCR: NEGATIVE

## 2024-05-27 LAB — HEPATIC FUNCTION PANEL
ALT: 563 U/L — ABNORMAL HIGH (ref 0–44)
AST: 152 U/L — ABNORMAL HIGH (ref 15–41)
Albumin: 3.3 g/dL — ABNORMAL LOW (ref 3.5–5.0)
Alkaline Phosphatase: 99 U/L (ref 38–126)
Bilirubin, Direct: 0.5 mg/dL — ABNORMAL HIGH (ref 0.0–0.2)
Indirect Bilirubin: 1.3 mg/dL — ABNORMAL HIGH (ref 0.3–0.9)
Total Bilirubin: 1.8 mg/dL — ABNORMAL HIGH (ref 0.0–1.2)
Total Protein: 7.1 g/dL (ref 6.5–8.1)

## 2024-05-27 LAB — PHOSPHORUS: Phosphorus: 2.7 mg/dL (ref 2.5–4.6)

## 2024-05-27 LAB — PRO BRAIN NATRIURETIC PEPTIDE: Pro Brain Natriuretic Peptide: 5425 pg/mL — ABNORMAL HIGH

## 2024-05-27 LAB — PROTIME-INR
INR: 1.5 — ABNORMAL HIGH (ref 0.8–1.2)
Prothrombin Time: 19.2 s — ABNORMAL HIGH (ref 11.4–15.2)

## 2024-05-27 LAB — DIGOXIN LEVEL: Digoxin Level: 0.8 ng/mL (ref 0.8–2.0)

## 2024-05-27 LAB — PROCALCITONIN: Procalcitonin: 0.26 ng/mL

## 2024-05-27 LAB — MAGNESIUM: Magnesium: 1.8 mg/dL (ref 1.7–2.4)

## 2024-05-27 LAB — LIPASE, BLOOD: Lipase: 24 U/L (ref 11–51)

## 2024-05-27 LAB — TROPONIN T, HIGH SENSITIVITY: Troponin T High Sensitivity: 29 ng/L — ABNORMAL HIGH (ref 0–19)

## 2024-05-27 MED ORDER — FUROSEMIDE 10 MG/ML IJ SOLN
40.0000 mg | Freq: Once | INTRAMUSCULAR | Status: AC
  Administered 2024-05-27: 40 mg via INTRAVENOUS
  Filled 2024-05-27: qty 4

## 2024-05-27 MED ORDER — GUAIFENESIN ER 600 MG PO TB12
600.0000 mg | ORAL_TABLET | Freq: Two times a day (BID) | ORAL | Status: DC
  Administered 2024-05-27 – 2024-05-30 (×8): 600 mg via ORAL
  Filled 2024-05-27 (×8): qty 1

## 2024-05-27 MED ORDER — SODIUM CHLORIDE 0.9 % IV SOLN
500.0000 mg | Freq: Once | INTRAVENOUS | Status: DC
  Filled 2024-05-27: qty 5

## 2024-05-27 MED ORDER — ACETAMINOPHEN 325 MG PO TABS
650.0000 mg | ORAL_TABLET | Freq: Four times a day (QID) | ORAL | Status: DC | PRN
  Administered 2024-05-28 – 2024-05-30 (×3): 650 mg via ORAL
  Filled 2024-05-27 (×5): qty 2

## 2024-05-27 MED ORDER — ACETAMINOPHEN 650 MG RE SUPP: 650.0000 mg | Freq: Four times a day (QID) | RECTAL | Status: DC | PRN

## 2024-05-27 MED ORDER — FENTANYL CITRATE (PF) 50 MCG/ML IJ SOSY
25.0000 ug | PREFILLED_SYRINGE | INTRAMUSCULAR | Status: DC | PRN
  Administered 2024-05-29: 25 ug via INTRAVENOUS
  Filled 2024-05-27: qty 1

## 2024-05-27 MED ORDER — MELATONIN 3 MG PO TABS
3.0000 mg | ORAL_TABLET | Freq: Every evening | ORAL | Status: DC | PRN
  Administered 2024-05-29 – 2024-05-30 (×3): 3 mg via ORAL
  Filled 2024-05-27 (×4): qty 1

## 2024-05-27 MED ORDER — WARFARIN SODIUM 6 MG PO TABS
7.0000 mg | ORAL_TABLET | Freq: Once | ORAL | Status: AC
  Administered 2024-05-27: 7 mg via ORAL
  Filled 2024-05-27: qty 1

## 2024-05-27 MED ORDER — PANTOPRAZOLE SODIUM 40 MG IV SOLR
40.0000 mg | Freq: Two times a day (BID) | INTRAVENOUS | Status: DC
  Administered 2024-05-27 – 2024-05-31 (×9): 40 mg via INTRAVENOUS
  Filled 2024-05-27 (×9): qty 10

## 2024-05-27 MED ORDER — DIGOXIN 125 MCG PO TABS
0.1250 mg | ORAL_TABLET | Freq: Every day | ORAL | Status: DC
  Administered 2024-05-27 – 2024-05-31 (×5): 0.125 mg via ORAL
  Filled 2024-05-27 (×5): qty 1

## 2024-05-27 MED ORDER — SODIUM CHLORIDE 0.9 % IV SOLN
2.0000 g | Freq: Once | INTRAVENOUS | Status: AC
  Administered 2024-05-27: 2 g via INTRAVENOUS
  Filled 2024-05-27: qty 20

## 2024-05-27 MED ORDER — SUCRALFATE 1 GM/10ML PO SUSP
1.0000 g | Freq: Three times a day (TID) | ORAL | Status: DC
  Administered 2024-05-27 – 2024-05-31 (×14): 1 g via ORAL
  Filled 2024-05-27 (×17): qty 10

## 2024-05-27 MED ORDER — ALBUTEROL SULFATE (2.5 MG/3ML) 0.083% IN NEBU: 2.5000 mg | INHALATION_SOLUTION | RESPIRATORY_TRACT | Status: DC | PRN

## 2024-05-27 MED ORDER — SODIUM CHLORIDE 0.9 % IV SOLN
2.0000 g | INTRAVENOUS | Status: DC
  Administered 2024-05-28 – 2024-05-29 (×2): 2 g via INTRAVENOUS
  Filled 2024-05-27 (×2): qty 20

## 2024-05-27 MED ORDER — FUROSEMIDE 10 MG/ML IJ SOLN
40.0000 mg | Freq: Two times a day (BID) | INTRAMUSCULAR | Status: DC
  Administered 2024-05-27 – 2024-05-29 (×5): 40 mg via INTRAVENOUS
  Filled 2024-05-27 (×7): qty 4

## 2024-05-27 MED ORDER — OXYCODONE HCL 5 MG PO TABS
5.0000 mg | ORAL_TABLET | Freq: Four times a day (QID) | ORAL | Status: DC | PRN
  Administered 2024-05-27 – 2024-05-30 (×8): 5 mg via ORAL
  Filled 2024-05-27 (×10): qty 1

## 2024-05-27 MED ORDER — SODIUM CHLORIDE 0.9 % IV SOLN: 1.0000 g | INTRAVENOUS | Status: DC

## 2024-05-27 MED ORDER — ENOXAPARIN SODIUM 150 MG/ML IJ SOSY
150.0000 mg | PREFILLED_SYRINGE | INTRAMUSCULAR | Status: DC
  Administered 2024-05-27 – 2024-05-29 (×3): 150 mg via SUBCUTANEOUS
  Filled 2024-05-27 (×6): qty 1

## 2024-05-27 MED ORDER — WARFARIN - PHARMACIST DOSING INPATIENT: Freq: Every day | Status: DC

## 2024-05-27 MED ORDER — LORAZEPAM 2 MG/ML IJ SOLN
1.0000 mg | INTRAMUSCULAR | Status: DC | PRN
  Administered 2024-05-30: 1 mg via INTRAVENOUS
  Filled 2024-05-27: qty 1

## 2024-05-27 MED ORDER — ONDANSETRON HCL 4 MG/2ML IJ SOLN
4.0000 mg | Freq: Once | INTRAMUSCULAR | Status: AC
  Administered 2024-05-27: 4 mg via INTRAVENOUS
  Filled 2024-05-27: qty 2

## 2024-05-27 MED ORDER — SODIUM CHLORIDE 0.9 % IV SOLN
500.0000 mg | INTRAVENOUS | Status: DC
  Administered 2024-05-27 – 2024-05-29 (×3): 500 mg via INTRAVENOUS
  Filled 2024-05-27 (×2): qty 5

## 2024-05-27 MED ORDER — ONDANSETRON HCL 4 MG/2ML IJ SOLN: 4.0000 mg | Freq: Four times a day (QID) | INTRAMUSCULAR | Status: DC | PRN

## 2024-05-27 MED ORDER — SODIUM CHLORIDE 0.9% FLUSH
3.0000 mL | Freq: Two times a day (BID) | INTRAVENOUS | Status: DC
  Administered 2024-05-27 – 2024-05-31 (×10): 3 mL via INTRAVENOUS

## 2024-05-27 MED ORDER — NALOXONE HCL 0.4 MG/ML IJ SOLN: 0.4000 mg | INTRAMUSCULAR | Status: DC | PRN

## 2024-05-27 MED ORDER — FENTANYL CITRATE (PF) 50 MCG/ML IJ SOSY
100.0000 ug | PREFILLED_SYRINGE | Freq: Once | INTRAMUSCULAR | Status: AC
  Administered 2024-05-27: 100 ug via INTRAVENOUS
  Filled 2024-05-27: qty 2

## 2024-05-27 MED ORDER — IOHEXOL 350 MG/ML SOLN
75.0000 mL | Freq: Once | INTRAVENOUS | Status: AC | PRN
  Administered 2024-05-27: 75 mL via INTRAVENOUS

## 2024-05-27 MED ORDER — DAPAGLIFLOZIN PROPANEDIOL 10 MG PO TABS
10.0000 mg | ORAL_TABLET | Freq: Every day | ORAL | Status: DC
  Administered 2024-05-27 – 2024-05-31 (×5): 10 mg via ORAL
  Filled 2024-05-27 (×5): qty 1

## 2024-05-27 NOTE — ED Provider Notes (Signed)
 I was about this patient's EKG to review.  He is quite tachycardic and has some ST depressions likely related to it.  On review of his previous ones it does appear that he has been tachycardic and depressed in the past.  On review of his vitals the patient is also tachypneic.  His chest x-ray is showing some abnormalities in bilateral bases.  Charge nurse informed.   Deandria Klute, Selinda, MD 05/27/24 [id number]

## 2024-05-27 NOTE — ED Notes (Signed)
 PT placed on 2L nasal canula per MD

## 2024-05-27 NOTE — H&P (Addendum)
 " History and Physical    Patient: Anthony Singleton FMW:[id number] DOB: 05-10-1971 DOA: 05/26/2024 DOS: the patient was seen and examined on 05/27/2024 PCP: Sirivol, Mamatha, MD  Patient coming from: Home  Chief Complaint:  Chief Complaint  Patient presents with   Shortness of Breath   HPI: Anthony Singleton is a 54 y.o. male with medical history significant of combined systolic and diastolic heart failure, pulmonary emboli, Bertrum syndrome, hearing loss in the left ear, and anxiety presents with chest pain and reports of coughing up blood  He has been experiencing severe chest pain and hemoptysis, describing the blood as 'bright red blood.' The chest pain is so intense that he cannot touch his chest, and he feels as though he is dying. This pain radiates down into his stomach and has prevented him from eating or drinking.  He is on warfarin and reports taking it as prescribed. His medication regimen was altered about four months ago, leading to immediate issues, and he was subsequently put back on his previous regimen. He follows up regularly with a cardiologist.  He reports significant swelling, which was severe enough last night that he could not put on his shoes.  He reports being in heart failure with similar swelling after developing pneumonia last February.   His symptoms have significantly impacted his daily life, including his ability to sleep, eat, or perform normal activities.  No smoking or alcohol use.  In the emergency department patient was noted to be afebrile with heart rates elevated to 130s, respirations elevated to 30s, blood pressures 132/94 to 155/89, and O2 saturations currently maintained on room air.  Labs significant for proBNP 5425, high-sensitivity troponin 27->29, hemoglobin 12, sodium 134, calcium  8.4, creatinine 3.3, AST 152, ALT 563, total bilirubin 1.8 with indirect bilirubin 1.3, and INR 1.5. CT angiogram of the chest have been obtained which noted no  signs of pulmonary embolism, new bibasilar pulmonary consolidation thought to possibly reflect a multifocal infection or aspiration with associated airway impaction in the right lower lobe, right pleural effusion slightly enlarged, stable left pleural effusion, new shotty mediastinal lymphadenopathy, and marked distal esophageal wall thickening concerning for esophagitis with small hiatal hernia.  CT scan of the abdomen pelvis did not note any acute abnormality.  Blood cultures had been obtained.  Patient had been given fentanyl  100 mcg IV, Zofran  4 mg IV, Lasix  40 mg IV, Rocephin , and azithromycin .  Review of Systems: As mentioned in the history of present illness. All other systems reviewed and are negative. 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   Tinnitus, left    Past Surgical History:  Procedure Laterality Date   APPENDECTOMY     when he was [facility], per wife   LEFT HEART CATH AND CORONARY ANGIOGRAPHY N/A 07/16/2023   Procedure: LEFT HEART CATH AND CORONARY ANGIOGRAPHY;  Surgeon: Rolan Ezra RAMAN, MD;  Location: [facility] INVASIVE CV LAB;  Service: Cardiovascular;  Laterality: N/A;   Social History:  reports that he has never smoked. He has never used smokeless tobacco. He reports current drug use. Drugs: Amphetamines and Marijuana. He reports that he does not drink alcohol.  Allergies[1]  Family History  Problem Relation Age of Onset   Stroke Mother    Stroke Father     Prior to Admission medications  Medication Sig  Start Date End Date Taking? Authorizing Provider  warfarin (COUMADIN ) 5 MG tablet Take 1 tablet (5 mg total) by mouth daily. 03/22/24  Yes Zenaida Morene PARAS, MD  acetaminophen  (TYLENOL ) 500 MG tablet Take 1,000 mg by mouth 2 (two) times daily as needed for moderate pain (pain score 4-6), headache or fever.    [provider]   dapagliflozin  propanediol (FARXIGA ) 10 MG TABS tablet Take 1 tablet (10 mg total) by mouth daily. 02/24/24   Milford, Harlene HERO, FNP  digoxin  (LANOXIN ) 0.125 MG tablet Take 0.125 mg by mouth daily.    [provider]  folic acid  (FOLVITE ) 1 MG tablet Take 1 tablet (1 mg total) by mouth daily. 07/30/23   Lee, Jordan, NP  gabapentin  (NEURONTIN ) 300 MG capsule Take 1 capsule (300 mg total) by mouth at bedtime. 02/15/24   Janna Ferrier, DO  Multiple Vitamins-Minerals (CERTAVITE/ANTIOXIDANTS) TABS Take 1 tablet by mouth daily.    [provider]  polyethylene glycol powder (GLYCOLAX /MIRALAX ) 17 GM/SCOOP powder Take 1 capful (17 g) and mix with 8 oz of water . Take mixture by mouth once daily. 07/21/23   Regalado, Belkys A, MD  rosuvastatin  (CRESTOR ) 10 MG tablet Take 1 tablet (10 mg total) by mouth daily. 07/30/23   Lee, Jordan, NP  sacubitril -valsartan  (ENTRESTO ) 49-51 MG Take 1 tablet by mouth 2 (two) times daily. 02/15/24   Janna Ferrier, DO  senna-docusate (SENOKOT-S) 8.6-50 MG tablet Take 2 tablets by mouth at bedtime. 02/15/24   Gomes, Adriana, DO  spironolactone  (ALDACTONE ) 25 MG tablet Take 0.5 tablets (12.5 mg total) by mouth daily. 03/28/24   Glena Harlene HERO, FNP    Physical Exam: Vitals:   05/27/24 0218 05/27/24 0230 05/27/24 0407 05/27/24 0600  BP:  (!) 149/109 (!) 142/101 (!) 132/94  Pulse:  (!) 134 (!) 132 63  Resp:  (!) 26 20   Temp: 99 F (37.2 C)  98.2 F (36.8 C)   TempSrc: Oral  Oral   SpO2:  100% 100% 97%  Weight:      Height:         Constitutional: Middle-age male who appears to be in some distress Eyes: PERRL, lids and conjunctivae normal ENMT: Mucous membranes are moist.  Normal dentition.  Neck: normal, supple,  Respiratory: Decreased aeration without significant wheezes or rhonchi appreciated at this time.  O2 saturation currently maintained on room air. Cardiovascular: Tachycardic. No extremity edema. 2+ pedal pulses. No carotid bruits.  Abdomen:  Neurolyse tenderness palpation of the abdomen. No hepatosplenomegaly. Bowel sounds positive.  Musculoskeletal: no clubbing / cyanosis. No joint deformity upper and lower extremities. Good ROM, no contractures. Normal muscle tone.  Skin: no rashes, lesions, ulcers. No induration Neurologic: CN 2-12 grossly intact.  Strength 5/5 in all 4.  Psychiatric: Normal judgment and insight. Alert and oriented x 3. Normal mood.   Data Reviewed:  EKG reveals sinus tachycardia at 137 bpm with possible left atrial enlargement.  Reviewed labs, imaging, and pertinent records as documented.  Assessment and Plan:  Combined systolic and diastolic congestive heart failure Acute on chronic.  On physical exam patient with trace lower extremity edema and JVD present.  proBNP elevated at 5425.  CTA of the chest noted slightly enlarged right-sided pleural effusion with stable left-sided pleural effusion he reports compliance with medication regimen.  Last echocardiogram noted EF to be less than 20% with grade 2 diastolic dysfunction. - Admit to progressive bed - Heart failure order set utilized - Strict I&O's and daily weights -  Continue Lasix  40 mg IV twice daily.  Reassess diuresis and adjust as deemed medically appropriate  SIRS/sepsis Suspected community-acquired pneumonia Acute.   Patient reports coughing up blood.  Noted to be tachycardic and tachypneic meeting SIRS criteria.  CTA of the chest concerning for new bibasilar pulmonary consolidations concerning for multifocal infection or aspiration with associated airway impaction in the right lower lobe and volume loss in the left lower lobe.  Procalcitonin noted to be 0.26.  Patient had been started on empiric antibiotics with Rocephin  and azithromycin  - Follow-up blood cultures - Continuous nasal cannula oxygen to maintain O2 saturation greater than 92% - Incentive spirometry and flutter valve - Check respiratory virus panel(negative for influenza, RSV, and  COVID 19) - Continue empiric antibiotics of Rocephin  and azithromycin .  Elevated troponin Acute.  High-sensitivity troponins 27->29.  EKG without significant ischemic changes.  Suspect secondary to demand. - Continue to monitor  History of pulmonary embolism Subtherapeutic INR Patient had been found to have concern for pulmonary embolism as well as RV thrombus during hospitalization back in September.  INR noted to be subtherapeutic at 1.5.  No signs of a pulmonary embolism noted on the CT of the chest.  Patient supposed to be on Coumadin . - Coumadin  per pharmacy with Lovenox  bridge  GERD with esophagitis Patient complained of substernal and abdominal pain.  CT noted marked distal esophageal wall thickening concerning for esophagitis with small hiatal hernia.  Suspect this could be the cause of patient's symptoms.  Patient had been given fentanyl  for pain. - Protonix  IV twice daily - Carafate 3 times daily with meals - Pain control as needed  Normocytic anemia Hemoglobin 12->11.4.  Patient reports hemoptysis, but no significant hemoptysis appreciated.  Vital signs otherwise noted to be stable. - Continue to monitor H&H  Hyponatremia Initial sodium noted to be 134->132.  Possibly secondary to patient being fluid overloaded. -  Elevated liver function studies Gilbert syndrome Labs revealed AST 152, ALT 563, and total bilirubin 1.8 with indirect bilirubin 1.3. Possibly related with hepatic congestion. 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  Not on any antiepileptic medications at this time. - Seizure precautions - Ativan  IV as needed for seizure activity  DVT prophylaxis:  Coumadin  Advance Care Planning:   Code Status: Full Code    Consults: Cardiology  Family Communication: Family updated at bedside  Severity of  Illness: The appropriate patient status for this patient is INPATIENT. Inpatient status is judged to be reasonable and necessary in order to provide the required intensity of service to ensure the patient's safety. The patient's presenting symptoms, physical exam findings, and initial radiographic and laboratory data in the context of their chronic comorbidities is felt to place them at high risk for further clinical deterioration. Furthermore, it is not anticipated that the patient will be medically stable for discharge from the hospital within 2 midnights of admission.   * I certify that at the point of admission it is my clinical judgment that the patient will require inpatient hospital care spanning beyond 2 midnights from the point of admission due to high intensity of service, high risk for further deterioration and high frequency of surveillance required.*  Author: Maximino DELENA Sharps, MD 05/27/2024 7:26 AM  For on call review [email or web address].      [1] No Known Allergies  "

## 2024-05-27 NOTE — ED Provider Notes (Signed)
 " [place] EMERGENCY DEPARTMENT AT [facility] Provider Note   CSN: [id number] Arrival date & time: 05/26/24  2130     Patient presents with: Shortness of Breath   Anthony Singleton is a 54 y.o. male.   54 year old male with a history of CHF, pulmonary emboli who presents the ER today secondary to shortness of breath and chest/abdominal pain.  Patient states he has fluid retention intermittently but for last week and a half has been persistent and is not getting better.  Significant lower extremity swelling and abdominal discomfort with it as well.  Patient states has been coughing up about a couple of straight blood for the last few days as well.  Adamant that this is not part of his sputum.  He has not had any hematemesis.  No fevers.  No sick contacts.  Take his medications as prescribed which include digoxin , warfarin, spironolactone , farxiga  and Entresto .   Shortness of Breath      Prior to Admission medications  Medication Sig Start Date End Date Taking? Authorizing Provider  warfarin (COUMADIN ) 5 MG tablet Take 1 tablet (5 mg total) by mouth daily. 03/22/24  Yes Zenaida Morene PARAS, MD  acetaminophen  (TYLENOL ) 500 MG tablet Take 1,000 mg by mouth 2 (two) times daily as needed for moderate pain (pain score 4-6), headache or fever.    [provider]  dapagliflozin  propanediol (FARXIGA ) 10 MG TABS tablet Take 1 tablet (10 mg total) by mouth daily. 02/24/24   Milford, Harlene HERO, FNP  digoxin  (LANOXIN ) 0.125 MG tablet Take 0.125 mg by mouth daily.    [provider]  folic acid  (FOLVITE ) 1 MG tablet Take 1 tablet (1 mg total) by mouth daily. 07/30/23   Lee, Jordan, NP  gabapentin  (NEURONTIN ) 300 MG capsule Take 1 capsule (300 mg total) by mouth at bedtime. 02/15/24   Janna Ferrier, DO  Multiple Vitamins-Minerals (CERTAVITE/ANTIOXIDANTS) TABS Take 1 tablet by mouth daily.    [provider]  polyethylene glycol powder (GLYCOLAX /MIRALAX ) 17 GM/SCOOP  powder Take 1 capful (17 g) and mix with 8 oz of water . Take mixture by mouth once daily. 07/21/23   Regalado, Belkys A, MD  rosuvastatin  (CRESTOR ) 10 MG tablet Take 1 tablet (10 mg total) by mouth daily. 07/30/23   Lee, Jordan, NP  sacubitril -valsartan  (ENTRESTO ) 49-51 MG Take 1 tablet by mouth 2 (two) times daily. 02/15/24   Janna Ferrier, DO  senna-docusate (SENOKOT-S) 8.6-50 MG tablet Take 2 tablets by mouth at bedtime. 02/15/24   Gomes, Adriana, DO  spironolactone  (ALDACTONE ) 25 MG tablet Take 0.5 tablets (12.5 mg total) by mouth daily. 03/28/24   Glena Harlene HERO, FNP    Allergies: Patient has no known allergies.    Review of Systems  Respiratory:  Positive for shortness of breath.     Updated Vital Signs BP (!) 142/101 (BP Location: Right Arm)   Pulse (!) 132   Temp 98.2 F (36.8 C) (Oral)   Resp 20   Ht 6' 6 (1.981 m)   Wt 108 kg   SpO2 100%   BMI 27.50 kg/m   Physical Exam Vitals and nursing note reviewed.  Constitutional:      Appearance: He is well-developed.  HENT:     Head: Normocephalic and atraumatic.  Cardiovascular:     Rate and Rhythm: Tachycardia present.  Pulmonary:     Effort: Pulmonary effort is normal. Tachypnea present. No respiratory distress.     Breath sounds: Examination of the right-lower field reveals  rales. Examination of the left-lower field reveals rales. Decreased breath sounds and rales present.  Abdominal:     General: There is no distension.     Tenderness: There is abdominal tenderness (diffusely).  Musculoskeletal:        General: Normal range of motion.     Cervical back: Normal range of motion.     Right lower leg: Edema present.     Left lower leg: Edema present.  Skin:    General: Skin is warm and dry.  Neurological:     General: No focal deficit present.     Mental Status: He is alert.     (all labs ordered are listed, but only abnormal results are displayed) Labs Reviewed  BASIC METABOLIC PANEL WITH GFR - Abnormal;  Notable for the following components:      Result Value   Sodium 134 (*)    CO2 21 (*)    Glucose, Bld 110 (*)    Calcium  8.4 (*)    All other components within normal limits  CBC - Abnormal; Notable for the following components:   RBC 3.98 (*)    Hemoglobin 12.0 (*)    HCT 38.1 (*)    RDW 15.9 (*)    All other components within normal limits  PROTIME-INR - Abnormal; Notable for the following components:   Prothrombin Time 19.2 (*)    INR 1.5 (*)    All other components within normal limits  PRO BRAIN NATRIURETIC PEPTIDE - Abnormal; Notable for the following components:   Pro Brain Natriuretic Peptide 5,425.0 (*)    All other components within normal limits  HEPATIC FUNCTION PANEL - Abnormal; Notable for the following components:   Albumin 3.3 (*)    AST 152 (*)    ALT 563 (*)    Total Bilirubin 1.8 (*)    Bilirubin, Direct 0.5 (*)    Indirect Bilirubin 1.3 (*)    All other components within normal limits  CBC WITH DIFFERENTIAL/PLATELET - Abnormal; Notable for the following components:   RBC 3.78 (*)    Hemoglobin 11.4 (*)    HCT 36.4 (*)    RDW 15.9 (*)    Monocytes Absolute 1.1 (*)    All other components within normal limits  BASIC METABOLIC PANEL WITH GFR - Abnormal; Notable for the following components:   Sodium 132 (*)    Calcium  8.5 (*)    All other components within normal limits  TROPONIN T, HIGH SENSITIVITY - Abnormal; Notable for the following components:   Troponin T High Sensitivity 27 (*)    All other components within normal limits  TROPONIN T, HIGH SENSITIVITY - Abnormal; Notable for the following components:   Troponin T High Sensitivity 29 (*)    All other components within normal limits  CULTURE, BLOOD (ROUTINE X 2)  CULTURE, BLOOD (ROUTINE X 2)  LIPASE, BLOOD  PROCALCITONIN  MAGNESIUM   PHOSPHORUS  DIGOXIN  LEVEL    EKG: None  Radiology: CT Angio Chest PE W and/or Wo Contrast Result Date: 05/27/2024 EXAM: CTA CHEST PE WITHOUT AND WITH  CONTRAST CT ABDOMEN AND PELVIS WITHOUT AND WITH CONTRAST 05/27/2024 01:59:55 AM TECHNIQUE: CTA of the chest was performed without and with the administration of 75 mL of iohexol  (OMNIPAQUE ) 350 MG/ML injection. Multiplanar reformatted images are provided for review. MIP images are provided for review. CT of the abdomen and pelvis was performed without and with the administration of intravenous contrast. Automated exposure control, iterative reconstruction, and/or weight based adjustment of  the mA/kV was utilized to reduce the radiation dose to as low as reasonably achievable. COMPARISON: 05/22/2024. CLINICAL HISTORY: Acute on localized abdominal pain, pulmonary embolism. High probability for pulmonary embolism. FINDINGS: CHEST: PULMONARY ARTERIES: Pulmonary arteries are adequately opacified for evaluation. No intraluminal filling defect to suggest pulmonary embolism. Main pulmonary artery is normal in caliber. MEDIASTINUM: Cardiomegaly. Mild coronary artery calcification. Shotty mediastinal adenopathy is new since prior examination, likely reactive or inflammatory in nature. No frankly pathologic adenopathy identified. There is no acute abnormality of the thoracic aorta. LUNGS AND PLEURA: There is new bibasilar pulmonary consolidation which may relate to multifocal infection or aspiration. Associated airway impaction within the right lower lobe. Volume loss and some degree of rounded atelectasis within the left lower lobe. These appear partially located on the right and relatively complex on the left with marked pleural thickening. Right pleural effusion is slightly enlarged. Left pleural effusion appears stable since prior examination. No pneumothorax. SOFT TISSUES AND BONES: No acute bone or soft tissue abnormality. ABDOMEN AND PELVIS: LIVER: The liver is unremarkable. GALLBLADDER AND BILE DUCTS: Gallbladder is unremarkable. No biliary ductal dilatation. SPLEEN: Spleen demonstrates no acute abnormality. PANCREAS:  Pancreas demonstrates no acute abnormality. ADRENAL GLANDS: 16 mm nodule again noted within the left adrenal gland which is technically indeterminate but stable since remote prior examination of 05/09/2017 and most in keeping with a benign adrenal adenoma. No follow up imaging is recommended. KIDNEYS, URETERS AND BLADDER: 5 mm nonobstructing calculus noted within the upper pole of the left kidney. The kidneys are otherwise unremarkable. No hydronephrosis. No perinephric or periureteral stranding. Urinary bladder is unremarkable. GI AND BOWEL: There is marked thickening of the distal esophagus which is nonspecific but may relate to underlying esophagitis, such as reflux esophagitis. This could be better assessed with endoscopy if indicated. Small hiatal hernia. Appendexam. The stomach, small bowel, and large bowel are otherwise unremarkable. There is no bowel obstruction. No abnormal bowel wall thickening or distension. REPRODUCTIVE: Reproductive organs are unremarkable. PERITONEUM AND RETROPERITONEUM: No ascites or free air. LYMPH NODES: No lymphadenopathy. BONES AND SOFT TISSUES: Osseous structures are age appropriate. No acute bone abnormality. No lytic or blastic bone lesion. No focal soft tissue abnormality. IMPRESSION: 1. No pulmonary embolism. 2. No acute abnormality identified in the abdomen or pelvis. 3. New bibasilar pulmonary consolidation, which may reflect multifocal infection or aspiration, with associated airway impaction in the right lower lobe and volume loss/rounded atelectasis in the left lower lobe. 4. Right pleural effusion is slightly enlarged, no partial lobulated. 5. Stable left pleural effusion again demonstrating complexity with marked pleural thickening. This is nonspecific , but may represent the sequela of prior infection or inflammation, such as the pulmonary infarction and associated reactive pleural effusion noted on 02/09/2024. . 6. New shotty mediastinal adenopathy, likely  reactive/inflammatory. 7. Marked distal esophageal wall thickening, which may reflect esophagitis, with small hiatal hernia. Correlation with endoscopy may be helpful for further evaluation. 8. Cardiomegaly and mild coronary artery calcification. 9. Stable 16 mm left adrenal nodule since 2018, most consistent with a benign adrenal adenoma, with no follow-up imaging recommended. Electronically signed by: Dorethia Molt MD 05/27/2024 02:28 AM EST RP Workstation: [id number]   CT ABDOMEN PELVIS W CONTRAST Result Date: 05/27/2024 EXAM: CTA CHEST PE WITHOUT AND WITH CONTRAST CT ABDOMEN AND PELVIS WITHOUT AND WITH CONTRAST 05/27/2024 01:59:55 AM TECHNIQUE: CTA of the chest was performed without and with the administration of 75 mL of iohexol  (OMNIPAQUE ) 350 MG/ML injection. Multiplanar reformatted images are provided for  review. MIP images are provided for review. CT of the abdomen and pelvis was performed without and with the administration of intravenous contrast. Automated exposure control, iterative reconstruction, and/or weight based adjustment of the mA/kV was utilized to reduce the radiation dose to as low as reasonably achievable. COMPARISON: 05/22/2024. CLINICAL HISTORY: Acute on localized abdominal pain, pulmonary embolism. High probability for pulmonary embolism. FINDINGS: CHEST: PULMONARY ARTERIES: Pulmonary arteries are adequately opacified for evaluation. No intraluminal filling defect to suggest pulmonary embolism. Main pulmonary artery is normal in caliber. MEDIASTINUM: Cardiomegaly. Mild coronary artery calcification. Shotty mediastinal adenopathy is new since prior examination, likely reactive or inflammatory in nature. No frankly pathologic adenopathy identified. There is no acute abnormality of the thoracic aorta. LUNGS AND PLEURA: There is new bibasilar pulmonary consolidation which may relate to multifocal infection or aspiration. Associated airway impaction within the right lower lobe. Volume loss  and some degree of rounded atelectasis within the left lower lobe. These appear partially located on the right and relatively complex on the left with marked pleural thickening. Right pleural effusion is slightly enlarged. Left pleural effusion appears stable since prior examination. No pneumothorax. SOFT TISSUES AND BONES: No acute bone or soft tissue abnormality. ABDOMEN AND PELVIS: LIVER: The liver is unremarkable. GALLBLADDER AND BILE DUCTS: Gallbladder is unremarkable. No biliary ductal dilatation. SPLEEN: Spleen demonstrates no acute abnormality. PANCREAS: Pancreas demonstrates no acute abnormality. ADRENAL GLANDS: 16 mm nodule again noted within the left adrenal gland which is technically indeterminate but stable since remote prior examination of 05/09/2017 and most in keeping with a benign adrenal adenoma. No follow up imaging is recommended. KIDNEYS, URETERS AND BLADDER: 5 mm nonobstructing calculus noted within the upper pole of the left kidney. The kidneys are otherwise unremarkable. No hydronephrosis. No perinephric or periureteral stranding. Urinary bladder is unremarkable. GI AND BOWEL: There is marked thickening of the distal esophagus which is nonspecific but may relate to underlying esophagitis, such as reflux esophagitis. This could be better assessed with endoscopy if indicated. Small hiatal hernia. Appendexam. The stomach, small bowel, and large bowel are otherwise unremarkable. There is no bowel obstruction. No abnormal bowel wall thickening or distension. REPRODUCTIVE: Reproductive organs are unremarkable. PERITONEUM AND RETROPERITONEUM: No ascites or free air. LYMPH NODES: No lymphadenopathy. BONES AND SOFT TISSUES: Osseous structures are age appropriate. No acute bone abnormality. No lytic or blastic bone lesion. No focal soft tissue abnormality. IMPRESSION: 1. No pulmonary embolism. 2. No acute abnormality identified in the abdomen or pelvis. 3. New bibasilar pulmonary consolidation, which  may reflect multifocal infection or aspiration, with associated airway impaction in the right lower lobe and volume loss/rounded atelectasis in the left lower lobe. 4. Right pleural effusion is slightly enlarged, no partial lobulated. 5. Stable left pleural effusion again demonstrating complexity with marked pleural thickening. This is nonspecific , but may represent the sequela of prior infection or inflammation, such as the pulmonary infarction and associated reactive pleural effusion noted on 02/09/2024. . 6. New shotty mediastinal adenopathy, likely reactive/inflammatory. 7. Marked distal esophageal wall thickening, which may reflect esophagitis, with small hiatal hernia. Correlation with endoscopy may be helpful for further evaluation. 8. Cardiomegaly and mild coronary artery calcification. 9. Stable 16 mm left adrenal nodule since 2018, most consistent with a benign adrenal adenoma, with no follow-up imaging recommended. Electronically signed by: Dorethia Molt MD 05/27/2024 02:28 AM EST RP Workstation: [id number]   DG Chest 2 View Result Date: 05/26/2024 EXAM: 2 VIEW(S) XRAY OF THE CHEST 05/26/2024 10:24:12 PM COMPARISON: 02/09/2024 CLINICAL HISTORY:  chest pain FINDINGS: LUNGS AND PLEURA: New small bilateral pleural effusions. Bibasilar infiltrates have increased on the left. There is central pulmonary vascular congestion. No pneumothorax. HEART AND MEDIASTINUM: Moderate cardiomegaly. The heart is enlarged. BONES AND SOFT TISSUES: No acute osseous abnormality. IMPRESSION: 1. New small bilateral pleural effusions. 2. Increased bibasilar infiltrates, left greater than right. 3. Moderate cardiomegaly with central pulmonary vascular congestion. Electronically signed by: Greig Pique MD 05/26/2024 10:55 PM EST RP Workstation: [id number]     Procedures   Medications Ordered in the ED  acetaminophen  (TYLENOL ) tablet 650 mg (has no administration in time range)    Or  acetaminophen  (TYLENOL ) suppository 650  mg (has no administration in time range)  melatonin tablet 3 mg (has no administration in time range)  ondansetron  (ZOFRAN ) injection 4 mg (has no administration in time range)  furosemide  (LASIX ) injection 40 mg (has no administration in time range)  naloxone (NARCAN) injection 0.4 mg (has no administration in time range)  fentaNYL  (SUBLIMAZE ) injection 25 mcg (has no administration in time range)  azithromycin  (ZITHROMAX ) 500 mg in sodium chloride  0.9 % 250 mL IVPB (500 mg Intravenous New Bag/Given 05/27/24 0343)  cefTRIAXone  (ROCEPHIN ) 1 g in sodium chloride  0.9 % 100 mL IVPB (has no administration in time range)  ondansetron  (ZOFRAN ) injection 4 mg (4 mg Intravenous Given 05/27/24 0026)  furosemide  (LASIX ) injection 40 mg (40 mg Intravenous Given 05/27/24 0216)  iohexol  (OMNIPAQUE ) 350 MG/ML injection 75 mL (75 mLs Intravenous Contrast Given 05/27/24 0200)  cefTRIAXone  (ROCEPHIN ) 2 g in sodium chloride  0.9 % 100 mL IVPB (0 g Intravenous Stopped 05/27/24 0413)  fentaNYL  (SUBLIMAZE ) injection 100 mcg (100 mcg Intravenous Given 05/27/24 0330)                                    Medical Decision Making Amount and/or Complexity of Data Reviewed Labs: ordered. Radiology: ordered.  Risk Prescription drug management. Decision regarding hospitalization.  Most likely CHF exacerbation as he has pleural effusions on his x-ray so we will start some Lasix  to help with that however also consider possible PE as he was subtherapeutic on his INR couple weeks ago.  Has a history of PEs as well.  Also consider possible pneumonia especially with the hemoptysis but is afebrile.  Will hold on antibiotics arthroses CT scan.  Abdomen diffusely tender which may be related to edema but will add on a CT of his abdomen pelvis since we are doing a angio of his chest anyway.  Patient will likely need to be admitted but needs to get a little bit more workup first.  Will start oxygen for work of breathing but not hypoxic.  CTs  consistent with pleural effusions.  Possible consolidation on his chest so blood cultures, Rocephin /azithromycin  and procalcitonin ordered to differentiate between bacterial or not. Lasix  ordered. Discussed with hospitalist for admission.   Final diagnoses:  Pleural effusion  Tachycardia  Chest pain, unspecified type    ED Discharge Orders     None          Eyana Stolze, Selinda, MD 05/27/24 0505  "

## 2024-05-27 NOTE — Progress Notes (Signed)
 Heart Failure Navigator Progress Note  Assessed for Heart & Vascular TOC clinic readiness.  Patient does not meet criteria due to he is an Advanced Heart Failure Team patient of Dr. Rolan. .   Navigator will sign off at this time.   Stephane Haddock, BSN, Scientist, clinical (histocompatibility and immunogenetics) Only

## 2024-05-27 NOTE — ED Notes (Addendum)
PT roomed

## 2024-05-27 NOTE — Plan of Care (Signed)

## 2024-05-27 NOTE — ED Notes (Signed)
 Pt given breakfast tray and shasta cola for breakfast. Pt ate 100% of breakfast.

## 2024-05-27 NOTE — ED Notes (Signed)
 Patient transported to CT

## 2024-05-27 NOTE — Progress Notes (Addendum)
 PHARMACY - ANTICOAGULATION CONSULT NOTE  Pharmacy Consult for warfarin Indication: pulmonary embolus  Allergies[1]  Patient Measurements: Height: 6' 6 (198.1 cm) Weight: 108 kg (238 lb) IBW/kg (Calculated) : 91.4 HEPARIN  DW (KG): 108  Vital Signs: Temp: 98.2 F (36.8 C) (01/02 0407) Temp Source: Oral (01/02 0407) BP: 132/94 (01/02 0600) Pulse Rate: 63 (01/02 0600)  Labs: Recent Labs    05/26/24 2154 05/27/24 0139 05/27/24 0340  HGB 12.0*  --  11.4*  HCT 38.1*  --  36.4*  PLT 180  --  181  LABPROT  --  19.2*  --   INR  --  1.5*  --   CREATININE 0.91  --  0.94    Estimated Creatinine Clearance: 120.2 mL/min (by C-G formula based on SCr of 0.94 mg/dL).   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 Patient on warfarin PTA (LD: 1/1 at 9am) for a PE (01/2024). 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 INR on admission was subtherapeutic at 1.5. Given recent PE and subtherapeutic INR, will bridge with lovenox . Pharmacy consulted to dose warfarin.   Home regimen: 5mg  PO daily MWF, 2.5mg  PO daily all other days  Will give slightly higher dose today in the setting of subtherapeutic INR.   Goal of Therapy:  INR 2-3 Monitor platelets by anticoagulation protocol: Yes   Plan:  Warfarin 7mg  PO x1 now Lovenox  150mg  subcutaneous (1.5mg /kg) daily Daily INR and CBC  Elma Fail, PharmD PGY1 Clinical Pharmacist [facility]  05/27/2024 8:42 AM       [1] No Known Allergies

## 2024-05-27 NOTE — Progress Notes (Signed)
" °  Carryover admission to the Day Admitter.  I discussed this case with the EDP, Dr. Lorette.  Per these discussions:   This is a 54 year old male with history of chronic systolic/diastolic heart failure, who is being admitted with suspected acute on chronic systolic/diastolic heart failure after presenting with progressive shortness of breath over the last 10 days associated with worsening of peripheral edema over that timeframe.  Over the last few days, has also noted increased abdominal distention associated with some mild generalized abdominal discomfort.  No chest pain.  Per most recent CT chest performed on 05/22/2024, he was noted to have bilateral pleural effusions.   Most recent echocardiogram performed in September 2025 was notable for LVEF less than 20%, along with grade 2 diastolic dysfunction.  Vital signs in the ED were notable for the following: Afebrile; sinus tachycardia, with heart rates in the 120s to 130s; stop blood pressures in the 130s to 150s mmHg. oxygen saturation 97 to 100% on room air.  proBNP 5425, without a prior proBNP data point available for point comparison.  He did have a BNP performed in November 2025, which was noted to be 282.  CTA chest showed no evidence of acute pulmonary embolism, demonstrating new basilar consolidation, potentially representing infection, while showing stable left pleural effusion and slight interval increase in right-sided pleural effusion relative to CT scan from 05/22/24.   EDP is ordered procalcitonin level, which is currently pending.  Additionally, regarding the patient's report of recent increase in abdominal distention associated with generalized abdominal discomfort, CT abdomen/pelvis was pursued and was reported showed no evidence of acute intra-abdominal or acute intrapelvic process.   In the ED, the patient has received azithromycin , Rocephin , fentanyl  100 mcg IV x 1 dose, Lasix  40 mg IV x 1 dose.  I have placed an order for  observation to PCU for further evaluation management of the above.  I have placed some additional preliminary admit orders via the adult multi-morbid admission order set. I have also ordered Lasix  40 mg IV twice daily, incentive spirometry, and if continued azithromycin  and Rocephin .  Regarding his generalized abdominal discomfort I have ordered fentanyl  25 mcg IV every 2 hours as needed, and have also placed orders for morning labs in the form of BMP, CBC, magnesium  and phosphorus levels.    Eva Pore, DO Hospitalist  "

## 2024-05-28 ENCOUNTER — Inpatient Hospital Stay (HOSPITAL_COMMUNITY)

## 2024-05-28 DIAGNOSIS — R042 Hemoptysis: Secondary | ICD-10-CM

## 2024-05-28 DIAGNOSIS — J90 Pleural effusion, not elsewhere classified: Secondary | ICD-10-CM

## 2024-05-28 DIAGNOSIS — I428 Other cardiomyopathies: Secondary | ICD-10-CM

## 2024-05-28 DIAGNOSIS — R000 Tachycardia, unspecified: Secondary | ICD-10-CM

## 2024-05-28 DIAGNOSIS — I2782 Chronic pulmonary embolism: Secondary | ICD-10-CM

## 2024-05-28 DIAGNOSIS — I5043 Acute on chronic combined systolic (congestive) and diastolic (congestive) heart failure: Secondary | ICD-10-CM | POA: Diagnosis not present

## 2024-05-28 DIAGNOSIS — J810 Acute pulmonary edema: Secondary | ICD-10-CM

## 2024-05-28 LAB — COMPREHENSIVE METABOLIC PANEL WITH GFR
ALT: 377 U/L — ABNORMAL HIGH (ref 0–44)
AST: 68 U/L — ABNORMAL HIGH (ref 15–41)
Albumin: 3 g/dL — ABNORMAL LOW (ref 3.5–5.0)
Alkaline Phosphatase: 95 U/L (ref 38–126)
Anion gap: 11 (ref 5–15)
BUN: 24 mg/dL — ABNORMAL HIGH (ref 6–20)
CO2: 26 mmol/L (ref 22–32)
Calcium: 8.4 mg/dL — ABNORMAL LOW (ref 8.9–10.3)
Chloride: 98 mmol/L (ref 98–111)
Creatinine, Ser: 1.09 mg/dL (ref 0.61–1.24)
GFR, Estimated: >60 mL/min
Glucose, Bld: 89 mg/dL (ref 70–99)
Potassium: 4.5 mmol/L (ref 3.5–5.1)
Sodium: 134 mmol/L — ABNORMAL LOW (ref 135–145)
Total Bilirubin: 1 mg/dL (ref 0.0–1.2)
Total Protein: 6.7 g/dL (ref 6.5–8.1)

## 2024-05-28 LAB — CBC
HCT: 34 % — ABNORMAL LOW (ref 39.0–52.0)
Hemoglobin: 11 g/dL — ABNORMAL LOW (ref 13.0–17.0)
MCH: 30.6 pg (ref 26.0–34.0)
MCHC: 32.4 g/dL (ref 30.0–36.0)
MCV: 94.4 fL (ref 80.0–100.0)
Platelets: 201 K/uL (ref 150–400)
RBC: 3.6 MIL/uL — ABNORMAL LOW (ref 4.22–5.81)
RDW: 15.9 % — ABNORMAL HIGH (ref 11.5–15.5)
WBC: 9.1 K/uL (ref 4.0–10.5)
nRBC: 0 % (ref 0.0–0.2)

## 2024-05-28 LAB — ECHOCARDIOGRAM COMPLETE
Calc EF: 25.1 %
Est EF: <20
Height: 78 in
S' Lateral: 5.4 cm
Single Plane A2C EF: 25.1 %
Single Plane A4C EF: 20.7 %
Weight: 3515.2 [oz_av]

## 2024-05-28 LAB — PROTIME-INR
INR: 1.6 — ABNORMAL HIGH (ref 0.8–1.2)
Prothrombin Time: 19.6 s — ABNORMAL HIGH (ref 11.4–15.2)

## 2024-05-28 MED ORDER — WARFARIN SODIUM 7.5 MG PO TABS
7.5000 mg | ORAL_TABLET | Freq: Once | ORAL | Status: AC
  Administered 2024-05-28: 7.5 mg via ORAL
  Filled 2024-05-28: qty 1

## 2024-05-28 MED ORDER — SPIRONOLACTONE 25 MG PO TABS
25.0000 mg | ORAL_TABLET | Freq: Every day | ORAL | Status: DC
  Administered 2024-05-28 – 2024-05-31 (×4): 25 mg via ORAL
  Filled 2024-05-28 (×4): qty 1

## 2024-05-28 MED ORDER — ALUM & MAG HYDROXIDE-SIMETH 200-200-20 MG/5ML PO SUSP
30.0000 mL | ORAL | Status: DC | PRN
  Administered 2024-05-28 (×2): 30 mL via ORAL
  Filled 2024-05-28 (×3): qty 30

## 2024-05-28 MED ORDER — SACUBITRIL-VALSARTAN 49-51 MG PO TABS
1.0000 | ORAL_TABLET | Freq: Two times a day (BID) | ORAL | Status: DC
  Administered 2024-05-28 – 2024-05-31 (×6): 1 via ORAL
  Filled 2024-05-28 (×6): qty 1

## 2024-05-28 MED ORDER — PERFLUTREN LIPID MICROSPHERE
1.0000 mL | INTRAVENOUS | Status: AC | PRN
  Administered 2024-05-28: 2 mL via INTRAVENOUS

## 2024-05-28 MED ORDER — SPIRONOLACTONE 12.5 MG HALF TABLET: 12.5000 mg | ORAL_TABLET | Freq: Every day | ORAL | Status: DC

## 2024-05-28 MED ORDER — TRANEXAMIC ACID FOR INHALATION: 500.0000 mg | Freq: Three times a day (TID) | RESPIRATORY_TRACT | Status: DC | PRN

## 2024-05-28 MED ORDER — TRANEXAMIC ACID FOR INHALATION
500.0000 mg | Freq: Three times a day (TID) | RESPIRATORY_TRACT | Status: DC
  Administered 2024-05-28 – 2024-05-31 (×8): 500 mg via RESPIRATORY_TRACT
  Filled 2024-05-28 (×2): qty 10
  Filled 2024-05-28: qty 5
  Filled 2024-05-28 (×8): qty 10

## 2024-05-28 NOTE — Plan of Care (Signed)
  Problem: Education: Goal: Knowledge of General Education information will improve Description: Including pain rating scale, medication(s)/side effects and non-pharmacologic comfort measures Outcome: Progressing   Problem: Clinical Measurements: Goal: Ability to maintain clinical measurements within normal limits will improve Outcome: Progressing   Problem: Clinical Measurements: Goal: Will remain free from infection Outcome: Progressing   Problem: Clinical Measurements: Goal: Diagnostic test results will improve Outcome: Progressing   Problem: Clinical Measurements: Goal: Diagnostic test results will improve Outcome: Progressing   Problem: Clinical Measurements: Goal: Respiratory complications will improve Outcome: Progressing   Problem: Clinical Measurements: Goal: Cardiovascular complication will be avoided Outcome: Progressing

## 2024-05-28 NOTE — Progress Notes (Signed)
 PHARMACY - ANTICOAGULATION CONSULT NOTE  Pharmacy Consult for warfarin Indication: pulmonary embolus  Allergies[1]  Patient Measurements: Height: 6' 6 (198.1 cm) Weight: 99.7 kg (219 lb 11.2 oz) IBW/kg (Calculated) : 91.4 HEPARIN  DW (KG): 101.2  Vital Signs: Temp: 99.2 F (37.3 C) (01/03 0719) Temp Source: Oral (01/03 0719) BP: 119/89 (01/03 0719) Pulse Rate: 132 (01/03 0719)  Labs: Recent Labs    05/26/24 2154 05/27/24 0139 05/27/24 0340 05/28/24 0241  HGB 12.0*  --  11.4* 11.0*  HCT 38.1*  --  36.4* 34.0*  PLT 180  --  181 201  LABPROT  --  19.2*  --  19.6*  INR  --  1.5*  --  1.6*  CREATININE 0.91  --  0.94 1.09    Estimated Creatinine Clearance: 103.7 mL/min (by C-G formula based on SCr of 1.09 mg/dL).   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   Past Week   calcium  elemental as carbonate (TUMS ULTRA 1000) 400 MG chewable tablet Chew 1,000 mg by mouth 2 (two) times daily as needed for heartburn (indigestion).   Past Week   dapagliflozin  propanediol (FARXIGA ) 10 MG TABS tablet Take 1 tablet (10 mg total) by mouth daily. 90 tablet 3 05/25/2024   digoxin  (LANOXIN ) 0.125 MG tablet Take 0.125 mg by mouth daily.   05/25/2024   diphenhydramine -acetaminophen  (TYLENOL  PM) 25-500 MG TABS tablet Take 1-2 tablets by mouth at bedtime as needed (sleep, pain).   Past Week   warfarin (COUMADIN ) 5 MG tablet Take 1 tablet (5 mg total) by mouth daily. (Patient taking differently: Take 2.5-5 mg by mouth See admin instructions. Take 1 tablet (5mg ) by mouth every Monday,  Wednesday and Friday in the morning. Take 1/2 tablet (2.5mg ) by mouth on all other days.) 30 tablet 1 05/25/2024   gabapentin  (NEURONTIN ) 300 MG capsule Take 1 capsule (300 mg total) by mouth at bedtime. (Patient not taking: Reported on 05/27/2024) 30 capsule 0 Not Taking   spironolactone  (ALDACTONE ) 25 MG tablet Take 0.5 tablets (12.5 mg total) by mouth daily. (Patient not taking: Reported on 05/27/2024) 45 tablet 11 Not Taking    Assessment: 54yo M presenting with heart failure exacerbation. Patient on warfarin PTA (LD: 1/1 at 9am) for a PE (01/2024). 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 INR on admission was subtherapeutic at 1.5. Given recent PE and subtherapeutic INR, will bridge with lovenox . Pharmacy consulted to dose warfarin.   Home regimen: 5mg  PO daily MWF, 2.5mg  PO daily all other days  05/29/2023: INR 1.6, which is subtherapeutic today after a dose of Warfarin 7 mg x 1. CBC stable and no bleeding charted.   Goal of Therapy:  INR 2-3 Monitor platelets by anticoagulation protocol: Yes   Plan:  Warfarin 7.5mg  PO x1 Lovenox  150mg  subcutaneous (1.5mg /kg) daily Daily INR and CBC  R. Samual Satterfield, PharmD PGY-1 Acute Care Pharmacy Resident [facility] Please refer to [facility] for [facility] Pharmacy numbers 05/28/2024 7:39 AM           [  1] No Known Allergies

## 2024-05-28 NOTE — Progress Notes (Signed)
" °  Echocardiogram 2D Echocardiogram has been performed.  Tinnie FORBES Gosling RDCS 05/28/2024, 3:55 PM "
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
## 2024-05-28 NOTE — Progress Notes (Signed)
 " PROGRESS NOTE    Anthony Singleton  FMW:[id number] DOB: [birth year] DOA: 05/26/2024 PCP: Sirivol, Mamatha, MD   Brief Narrative:  This 54 yrs old Male with PMH significant for combined systolic and diastolic heart failure, pulmonary embolism, Gilbert syndrome, hearing loss in the left ear, and anxiety presented in the ED with chest pain and coughing up blood.  He describes chest pain is so intense that he cannot even touch his chest , describing blood as bright red, pain radiating down to his stomach and prevented him from eating or drinking. He is on warfarin and taking it as prescribed. He regularly follows up with cardiologist. He also reports significant foot swelling that he could not put on his shoes.  His symptoms have significantly impacted his daily life including his ability to sleep and eat or perform normal activities. In the ED his heart rate noted to be in 130s, blood pressure 155/89.  Significant labs include proBNP 5425, Trop 27> 29, Creatinine 3.3, AST 152, ALT 563, total bilirubin 1.8,  INR 1.5.  CTA chest ruled out pulmonary embolism but shows bilateral bibasilar pulmonary consolidation likely multifocal infection or aspiration, right pleural effusion slightly enlarged, new shotty mediastinal lymphadenopathy and marked distal esophageal wall thickening concerning for esophagitis with small lateral hernia.  CT abdomen and pelvis no acute abnormality found.  Patient was admitted for further evaluation and started on empiric antibiotics,  ceftriaxone  and Zithromax  and IV Lasix .  Pulmonology and cardiology is consulted.  Assessment & Plan:   Principal Problem:   Acute on chronic combined systolic and diastolic heart failure (HCC) Active Problems:   CAP (community acquired pneumonia)   SIRS (systemic inflammatory response syndrome) (HCC)   Elevated troponin   History of pulmonary embolism   Subtherapeutic international normalized ratio (INR)   GERD with esophagitis   Normocytic  anemia   Hyponatremia   History of seizure   Elevated liver enzymes   Gilbert syndrome  Acute on chronic combined systolic and diastolic CHF: Patient presented with lower extremity edema, JVD+, proBNP 5425. CTA chest ruled out pulmonary embolism but shows slightly enlarged right-sided pleural effusion with stable left pleural effusion. Patient reports compliance with his medical regimen. Last echocardiogram showed LVEF less than 20% with grade 2 diastolic dysfunction Monitor daily weight, intake output charting. Continue IV Lasix  40 mg twice daily.  Continue digoxin  0.125 mg daily Obtain 2D echocardiogram. Cardiology is consulted.  Sepsis : Suspected community-acquired pneumonia: Patient reports coughing up blood.  Noted to be tachycardic and tachypneic meeting sepsis criteria.   CTA of the chest concerning for new bibasilar pulmonary consolidations concerning for multifocal infection or aspiration.  Procalcitonin noted to be 0.26.   Continue empiric antibiotics ceftriaxone  and Zithromax . Follow-up blood cultures Continue supplemental oxygen to maintain O2 saturation greater than 92% Incentive spirometry and flutter valve Influenza, RSV, COVID, RVP negative.  Hemoptysis :  It could be multifactorial, acute on chronic systolic CHF, pneumonia, history of PE with subtherapeutic INR. Continue IV antibiotics and IV diuresis. Pulmonology is consulted.   Elevated troponin: High-sensitivity troponins 27->29.   EKG without significant ischemic changes.   Suspect secondary to demand ischemia in the setting of infection.. Continue to monitor   History of pulmonary embolism: Subtherapeutic INR Patient were found to have concern for pulmonary embolism as well as RV thrombus during hospitalization back in September.  INR noted to be subtherapeutic at 1.5.  No signs of a pulmonary embolism noted on the CT of the chest.  Patient supposed  to be on Coumadin . Coumadin  per pharmacy with Lovenox   bridge   GERD with esophagitis: Patient reports substernal and abdominal pain.   CT noted marked distal esophageal wall thickening concerning for esophagitis with small hiatal hernia.  Suspect this could be the cause of patient's symptoms.  Patient had been given fentanyl  for pain. Continue Protonix  IV twice daily. Continue Carafate  3 times daily with meals Adequate Pain control as needed.   Normocytic anemia: Hemoglobin 12->11.4.   Patient reports hemoptysis, but no significant hemoptysis appreciated.   Vital signs otherwise noted to be stable. Continue to monitor H&H   Hyponatremia: Initial sodium noted to be 134->132.   Possibly secondary to patient being fluid overloaded.   Elevated liver function studies: Gilbert syndrome Labs revealed AST 152, ALT 563, and total bilirubin 1.8 with indirect bilirubin 1.3.  Possibly related with hepatic congestion. 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  Not on any antiepileptic medications at this time. Continue Seizure precautions Continue Ativan  IV as needed for seizure activity.  DVT prophylaxis: Coumadin . Code Status: Full code. Family Communication: Son at bed side. Disposition Plan:     Status is: Inpatient Remains inpatient appropriate because: Patient admitted for chest pain with hemoptysis and fluid overload , started on IV diuresis and IV antibiotics.  Patient is not medically ready for discharge.   Consultants:  Pulmonology Cardiology  Procedures: CTA chest  Antimicrobials:  Anti-infectives (From admission, onward)    Start     Dose/Rate Route Frequency Ordered Stop   05/28/24 0400  cefTRIAXone  (ROCEPHIN ) 1 g in sodium chloride  0.9 % 100 mL IVPB  Status:  Discontinued        1 g 200 mL/hr over 30 Minutes Intravenous Every 24 hours 05/27/24 0316 05/27/24 0740    05/28/24 0400  cefTRIAXone  (ROCEPHIN ) 2 g in sodium chloride  0.9 % 100 mL IVPB        2 g 200 mL/hr over 30 Minutes Intravenous Every 24 hours 05/27/24 0740     05/27/24 0400  azithromycin  (ZITHROMAX ) 500 mg in sodium chloride  0.9 % 250 mL IVPB        500 mg 250 mL/hr over 60 Minutes Intravenous Every 24 hours 05/27/24 0316     05/27/24 0245  cefTRIAXone  (ROCEPHIN ) 2 g in sodium chloride  0.9 % 100 mL IVPB        2 g 200 mL/hr over 30 Minutes Intravenous  Once 05/27/24 0236 05/27/24 0413   05/27/24 0245  azithromycin  (ZITHROMAX ) 500 mg in sodium chloride  0.9 % 250 mL IVPB  Status:  Discontinued        500 mg 250 mL/hr over 60 Minutes Intravenous  Once 05/27/24 0236 05/27/24 0320      Subjective: Patient was seen and examined at bedside.  Overnight events noted. Patient reports feeling slightly better but still coughing up blood , not a lot , reports not feeling well.  Objective: Vitals:   05/27/24 1923 05/27/24 2340 05/28/24 0312 05/28/24 0719  BP: (!) 125/93 115/79 117/78 119/89  Pulse: (!) 128  (!) 145 (!) 132  Resp: 19 16 17 17   Temp: 100.2 F (37.9 C) 99.1 F (37.3 C) 98.5 F (36.9 C) 99.2 F (37.3 C)  TempSrc: Oral Oral Oral Oral  SpO2: 95% 97% 94% 95%  Weight:   99.7 kg   Height:  Intake/Output Summary (Last 24 hours) at 05/28/2024 1138 Last data filed at 05/28/2024 0316 Gross per 24 hour  Intake 1400 ml  Output 1590 ml  Net -190 ml   Filed Weights   05/26/24 2148 05/27/24 1141 05/28/24 0312  Weight: 108 kg 101.2 kg 99.7 kg    Examination:  General exam: Appears calm and comfortable, not in any acute distress. Deconditioned. Respiratory system: CTA Bilaterally.  Respiratory effort normal.  RR 16 Cardiovascular system: S1 & S2 heard, RRR. No JVD, murmurs, rubs, gallops or clicks. Gastrointestinal system: Abdomen is non distended, soft and non tender.  Normal bowel sounds heard. Central nervous system: Alert and oriented x 3. No focal neurological  deficits. Extremities: No edema, no cyanosis, no clubbing. Skin: No rashes, lesions or ulcers Psychiatry: Judgement and insight appear normal. Mood & affect appropriate.     Data Reviewed: I have personally reviewed following labs and imaging studies  CBC: Recent Labs  Lab 05/26/24 2154 05/27/24 0340 05/28/24 0241  WBC 9.9 9.7 9.1  NEUTROABS  --  7.4  --   HGB 12.0* 11.4* 11.0*  HCT 38.1* 36.4* 34.0*  MCV 95.7 96.3 94.4  PLT 180 181 201   Basic Metabolic Panel: Recent Labs  Lab 05/26/24 2154 05/27/24 0340 05/28/24 0241  NA 134* 132* 134*  K 4.6 4.5 4.5  CL 103 99 98  CO2 21* 22 26  GLUCOSE 110* 99 89  BUN 15 14 24*  CREATININE 0.91 0.94 1.09  CALCIUM  8.4* 8.5* 8.4*  MG  --  1.8  --   PHOS  --  2.7  --    GFR: Estimated Creatinine Clearance: 103.7 mL/min (by C-G formula based on SCr of 1.09 mg/dL). Liver Function Tests: Recent Labs  Lab 05/27/24 0139 05/28/24 0241  AST 152* 68*  ALT 563* 377*  ALKPHOS 99 95  BILITOT 1.8* 1.0  PROT 7.1 6.7  ALBUMIN 3.3* 3.0*   Recent Labs  Lab 05/27/24 0139  LIPASE 24   No results for input(s): AMMONIA in the last 168 hours. Coagulation Profile: Recent Labs  Lab 05/27/24 0139 05/28/24 0241  INR 1.5* 1.6*   Cardiac Enzymes: No results for input(s): CKTOTAL, CKMB, CKMBINDEX, TROPONINI in the last 168 hours. BNP (last 3 results) Recent Labs    05/27/24 0139  PROBNP 5,425.0*   HbA1C: No results for input(s): HGBA1C in the last 72 hours. CBG: No results for input(s): GLUCAP in the last 168 hours. Lipid Profile: No results for input(s): CHOL, HDL, LDLCALC, TRIG, CHOLHDL, LDLDIRECT in the last 72 hours. Thyroid Function Tests: No results for input(s): TSH, T4TOTAL, FREET4, T3FREE, THYROIDAB in the last 72 hours. Anemia Panel: No results for input(s): VITAMINB12, FOLATE, FERRITIN, TIBC, IRON, RETICCTPCT in the last 72 hours. Sepsis Labs: Recent Labs  Lab  05/27/24 0340  PROCALCITON 0.26    Recent Results (from the past 240 hours)  Blood culture (routine x 2)     Status: None (Preliminary result)   Collection Time: 05/27/24  3:00 AM   Specimen: BLOOD  Result Value Ref Range Status   Specimen Description BLOOD LEFT ANTECUBITAL  Final   Special Requests   Final    BOTTLES DRAWN AEROBIC AND ANAEROBIC Blood Culture adequate volume   Culture   Final    NO GROWTH 1 DAY Performed at [facility], [address] [place], [place] [address]    Report Status PENDING  Incomplete  Blood culture (routine x 2)     Status: None (  Preliminary result)   Collection Time: 05/27/24  3:40 AM   Specimen: BLOOD  Result Value Ref Range Status   Specimen Description BLOOD RIGHT ANTECUBITAL  Final   Special Requests   Final    BOTTLES DRAWN AEROBIC AND ANAEROBIC Blood Culture adequate volume   Culture   Final    NO GROWTH 1 DAY Performed at [facility], [address] [place], [place] [address]    Report Status PENDING  Incomplete  Resp panel by RT-PCR (RSV, Flu A&B, Covid) Anterior Nasal Swab     Status: None   Collection Time: 05/27/24  7:43 AM   Specimen: Anterior Nasal Swab  Result Value Ref Range Status   SARS Coronavirus 2 by RT PCR NEGATIVE NEGATIVE Final   Influenza A by PCR NEGATIVE NEGATIVE Final   Influenza B by PCR NEGATIVE NEGATIVE Final    Comment: (NOTE) The Xpert Xpress SARS-CoV-2/FLU/RSV plus assay is intended as an aid in the diagnosis of influenza from Nasopharyngeal swab specimens and should not be used as a sole basis for treatment. Nasal washings and aspirates are unacceptable for Xpert Xpress SARS-CoV-2/FLU/RSV testing.  Fact Sheet for Patients: bloggercourse.com  Fact Sheet for Healthcare Providers: seriousbroker.it  This test is not yet approved or cleared by the [place]  FDA and has been authorized for detection and/or diagnosis of SARS-CoV-2 by FDA  under an Emergency Use Authorization (EUA). This EUA will remain in effect (meaning this test can be used) for the duration of the COVID-19 declaration under Section 564(b)(1) of the Act, 21 U.S.C. section 360bbb-3(b)(1), unless the authorization is terminated or revoked.     Resp Syncytial Virus by PCR NEGATIVE NEGATIVE Final    Comment: (NOTE) Fact Sheet for Patients: bloggercourse.com  Fact Sheet for Healthcare Providers: seriousbroker.it  This test is not yet approved or cleared by the [place]  FDA and has been authorized for detection and/or diagnosis of SARS-CoV-2 by FDA under an Emergency Use Authorization (EUA). This EUA will remain in effect (meaning this test can be used) for the duration of the COVID-19 declaration under Section 564(b)(1) of the Act, 21 U.S.C. section 360bbb-3(b)(1), unless the authorization is terminated or revoked.  Performed at [facility], [address], [place], [place] [address]     Radiology Studies: CT Angio Chest PE W and/or Wo Contrast Result Date: 05/27/2024 EXAM: CTA CHEST PE WITHOUT AND WITH CONTRAST CT ABDOMEN AND PELVIS WITHOUT AND WITH CONTRAST 05/27/2024 01:59:55 AM TECHNIQUE: CTA of the chest was performed without and with the administration of 75 mL of iohexol  (OMNIPAQUE ) 350 MG/ML injection. Multiplanar reformatted images are provided for review. MIP images are provided for review. CT of the abdomen and pelvis was performed without and with the administration of intravenous contrast. Automated exposure control, iterative reconstruction, and/or weight based adjustment of the mA/kV was utilized to reduce the radiation dose to as low as reasonably achievable. COMPARISON: 05/22/2024. CLINICAL HISTORY: Acute on localized abdominal pain, pulmonary embolism. High probability for pulmonary embolism. FINDINGS: CHEST: PULMONARY ARTERIES: Pulmonary arteries are adequately opacified for  evaluation. No intraluminal filling defect to suggest pulmonary embolism. Main pulmonary artery is normal in caliber. MEDIASTINUM: Cardiomegaly. Mild coronary artery calcification. Shotty mediastinal adenopathy is new since prior examination, likely reactive or inflammatory in nature. No frankly pathologic adenopathy identified. There is no acute abnormality of the thoracic aorta. LUNGS AND PLEURA: There is new bibasilar pulmonary consolidation which may relate to multifocal infection or aspiration. Associated airway impaction within the right  lower lobe. Volume loss and some degree of rounded atelectasis within the left lower lobe. These appear partially located on the right and relatively complex on the left with marked pleural thickening. Right pleural effusion is slightly enlarged. Left pleural effusion appears stable since prior examination. No pneumothorax. SOFT TISSUES AND BONES: No acute bone or soft tissue abnormality. ABDOMEN AND PELVIS: LIVER: The liver is unremarkable. GALLBLADDER AND BILE DUCTS: Gallbladder is unremarkable. No biliary ductal dilatation. SPLEEN: Spleen demonstrates no acute abnormality. PANCREAS: Pancreas demonstrates no acute abnormality. ADRENAL GLANDS: 16 mm nodule again noted within the left adrenal gland which is technically indeterminate but stable since remote prior examination of 05/09/2017 and most in keeping with a benign adrenal adenoma. No follow up imaging is recommended. KIDNEYS, URETERS AND BLADDER: 5 mm nonobstructing calculus noted within the upper pole of the left kidney. The kidneys are otherwise unremarkable. No hydronephrosis. No perinephric or periureteral stranding. Urinary bladder is unremarkable. GI AND BOWEL: There is marked thickening of the distal esophagus which is nonspecific but may relate to underlying esophagitis, such as reflux esophagitis. This could be better assessed with endoscopy if indicated. Small hiatal hernia. Appendexam. The stomach, small  bowel, and large bowel are otherwise unremarkable. There is no bowel obstruction. No abnormal bowel wall thickening or distension. REPRODUCTIVE: Reproductive organs are unremarkable. PERITONEUM AND RETROPERITONEUM: No ascites or free air. LYMPH NODES: No lymphadenopathy. BONES AND SOFT TISSUES: Osseous structures are age appropriate. No acute bone abnormality. No lytic or blastic bone lesion. No focal soft tissue abnormality. IMPRESSION: 1. No pulmonary embolism. 2. No acute abnormality identified in the abdomen or pelvis. 3. New bibasilar pulmonary consolidation, which may reflect multifocal infection or aspiration, with associated airway impaction in the right lower lobe and volume loss/rounded atelectasis in the left lower lobe. 4. Right pleural effusion is slightly enlarged, no partial lobulated. 5. Stable left pleural effusion again demonstrating complexity with marked pleural thickening. This is nonspecific , but may represent the sequela of prior infection or inflammation, such as the pulmonary infarction and associated reactive pleural effusion noted on 02/09/2024. . 6. New shotty mediastinal adenopathy, likely reactive/inflammatory. 7. Marked distal esophageal wall thickening, which may reflect esophagitis, with small hiatal hernia. Correlation with endoscopy may be helpful for further evaluation. 8. Cardiomegaly and mild coronary artery calcification. 9. Stable 16 mm left adrenal nodule since 2018, most consistent with a benign adrenal adenoma, with no follow-up imaging recommended. Electronically signed by: Dorethia Molt MD 05/27/2024 02:28 AM EST RP Workstation: [id number]   CT ABDOMEN PELVIS W CONTRAST Result Date: 05/27/2024 EXAM: CTA CHEST PE WITHOUT AND WITH CONTRAST CT ABDOMEN AND PELVIS WITHOUT AND WITH CONTRAST 05/27/2024 01:59:55 AM TECHNIQUE: CTA of the chest was performed without and with the administration of 75 mL of iohexol  (OMNIPAQUE ) 350 MG/ML injection. Multiplanar reformatted images  are provided for review. MIP images are provided for review. CT of the abdomen and pelvis was performed without and with the administration of intravenous contrast. Automated exposure control, iterative reconstruction, and/or weight based adjustment of the mA/kV was utilized to reduce the radiation dose to as low as reasonably achievable. COMPARISON: 05/22/2024. CLINICAL HISTORY: Acute on localized abdominal pain, pulmonary embolism. High probability for pulmonary embolism. FINDINGS: CHEST: PULMONARY ARTERIES: Pulmonary arteries are adequately opacified for evaluation. No intraluminal filling defect to suggest pulmonary embolism. Main pulmonary artery is normal in caliber. MEDIASTINUM: Cardiomegaly. Mild coronary artery calcification. Shotty mediastinal adenopathy is new since prior examination, likely reactive or inflammatory in nature. No frankly pathologic adenopathy  identified. There is no acute abnormality of the thoracic aorta. LUNGS AND PLEURA: There is new bibasilar pulmonary consolidation which may relate to multifocal infection or aspiration. Associated airway impaction within the right lower lobe. Volume loss and some degree of rounded atelectasis within the left lower lobe. These appear partially located on the right and relatively complex on the left with marked pleural thickening. Right pleural effusion is slightly enlarged. Left pleural effusion appears stable since prior examination. No pneumothorax. SOFT TISSUES AND BONES: No acute bone or soft tissue abnormality. ABDOMEN AND PELVIS: LIVER: The liver is unremarkable. GALLBLADDER AND BILE DUCTS: Gallbladder is unremarkable. No biliary ductal dilatation. SPLEEN: Spleen demonstrates no acute abnormality. PANCREAS: Pancreas demonstrates no acute abnormality. ADRENAL GLANDS: 16 mm nodule again noted within the left adrenal gland which is technically indeterminate but stable since remote prior examination of 05/09/2017 and most in keeping with a benign  adrenal adenoma. No follow up imaging is recommended. KIDNEYS, URETERS AND BLADDER: 5 mm nonobstructing calculus noted within the upper pole of the left kidney. The kidneys are otherwise unremarkable. No hydronephrosis. No perinephric or periureteral stranding. Urinary bladder is unremarkable. GI AND BOWEL: There is marked thickening of the distal esophagus which is nonspecific but may relate to underlying esophagitis, such as reflux esophagitis. This could be better assessed with endoscopy if indicated. Small hiatal hernia. Appendexam. The stomach, small bowel, and large bowel are otherwise unremarkable. There is no bowel obstruction. No abnormal bowel wall thickening or distension. REPRODUCTIVE: Reproductive organs are unremarkable. PERITONEUM AND RETROPERITONEUM: No ascites or free air. LYMPH NODES: No lymphadenopathy. BONES AND SOFT TISSUES: Osseous structures are age appropriate. No acute bone abnormality. No lytic or blastic bone lesion. No focal soft tissue abnormality. IMPRESSION: 1. No pulmonary embolism. 2. No acute abnormality identified in the abdomen or pelvis. 3. New bibasilar pulmonary consolidation, which may reflect multifocal infection or aspiration, with associated airway impaction in the right lower lobe and volume loss/rounded atelectasis in the left lower lobe. 4. Right pleural effusion is slightly enlarged, no partial lobulated. 5. Stable left pleural effusion again demonstrating complexity with marked pleural thickening. This is nonspecific , but may represent the sequela of prior infection or inflammation, such as the pulmonary infarction and associated reactive pleural effusion noted on 02/09/2024. . 6. New shotty mediastinal adenopathy, likely reactive/inflammatory. 7. Marked distal esophageal wall thickening, which may reflect esophagitis, with small hiatal hernia. Correlation with endoscopy may be helpful for further evaluation. 8. Cardiomegaly and mild coronary artery calcification. 9.  Stable 16 mm left adrenal nodule since 2018, most consistent with a benign adrenal adenoma, with no follow-up imaging recommended. Electronically signed by: Dorethia Molt MD 05/27/2024 02:28 AM EST RP Workstation: [id number]   DG Chest 2 View Result Date: 05/26/2024 EXAM: 2 VIEW(S) XRAY OF THE CHEST 05/26/2024 10:24:12 PM COMPARISON: 02/09/2024 CLINICAL HISTORY: chest pain FINDINGS: LUNGS AND PLEURA: New small bilateral pleural effusions. Bibasilar infiltrates have increased on the left. There is central pulmonary vascular congestion. No pneumothorax. HEART AND MEDIASTINUM: Moderate cardiomegaly. The heart is enlarged. BONES AND SOFT TISSUES: No acute osseous abnormality. IMPRESSION: 1. New small bilateral pleural effusions. 2. Increased bibasilar infiltrates, left greater than right. 3. Moderate cardiomegaly with central pulmonary vascular congestion. Electronically signed by: Greig Pique MD 05/26/2024 10:55 PM EST RP Workstation: [id number]   Scheduled Meds:  dapagliflozin  propanediol  10 mg Oral Daily   digoxin   0.125 mg Oral Daily   enoxaparin  (LOVENOX ) injection  150 mg Subcutaneous Q24H   furosemide   40  mg Intravenous BID   guaiFENesin   600 mg Oral BID   pantoprazole  (PROTONIX ) IV  40 mg Intravenous Q12H   sodium chloride  flush  3 mL Intravenous Q12H   sucralfate   1 g Oral TID WC & HS   warfarin  7.5 mg Oral ONCE-1600   Warfarin - Pharmacist Dosing Inpatient   Does not apply q1600   Continuous Infusions:  azithromycin  500 mg (05/28/24 0431)   cefTRIAXone  (ROCEPHIN )  IV 2 g (05/28/24 0351)     LOS: 1 day    Time spent: 50 mins    Darcel Dawley, MD [facility]   If 7PM-7AM, please contact night-coverage  "

## 2024-05-28 NOTE — Consult Note (Addendum)
 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  Also reported persistent fluid retention with associated lower extremity swelling and abdominal discomfort.  Workup on admission concerning for combined systolic and diastolic congestive heart failure exacerbation patient was admitted per hospitalist.  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  Ongoing mild hemoptysis with active production of sputum on my assessment  Objective    Blood pressure 119/89, pulse (!) 132, temperature 99.2 F (37.3 C), temperature source Oral, resp. rate 17, height 6' 6 (1.981 m), weight 99.7 kg, SpO2 95%.        Intake/Output Summary (Last 24 hours) at 05/28/2024 1213 Last data filed at 05/28/2024 0316 Gross per 24 hour  Intake 1080 ml  Output 1190 ml  Net -110 ml   Filed Weights    05/26/24 2148 05/27/24 1141 05/28/24 0312  Weight: 108 kg 101.2 kg 99.7 kg    Examination: General: Chronic ill-appearing middle-age male sitting up in bed in no acute distress but moderate discomfort HEENT: [place]/AT, MM pink/moist, PERRL,  Neuro: Alert and orient x 3, nonfocal, weak CV: s1s2 regular rate and rhythm, no murmur, rubs, or gallops,  PULM: Crackles bilaterally, no increased work of breathing, productive cough GI: soft, bowel sounds active in all 4 quadrants, non-tender, non-distended, tolerating oral diet Extremities: warm/dry, no edema  Skin: no rashes or lesions  Resolved problem list   Assessment and Plan  New onset hemoptysis -Differential diagnosis includes functional mitral regurgitation causing potential DAH type picture in the setting of end-stage cardiomyopathy and compliance issues versus pulmonary/alveolar irritation in the setting of persistent cough due to CAP with supratherapeutic INR Community-acquired pneumonia -Patient presented with dyspnea and cough with associated hemoptysis CTA negative for acute PE but did reveal new bibasilar pulmonary consolidation concerning for multifocal pneumonia or aspiration with a right impaction of the right lower lobe and volume loss in the left lower lobe Bilateral pleural effusions total volume removal -History of right parapneumonic effusion, s/p smallbore chest tube placed February 2025 History of pulmonary embolism anticoagulated with Coumadin  at baseline Supratherapeutic INR End-stage cardiomyopathy - Most recent echocardiogram September 2025 with EF less than 20% and grade 2 diastolic dysfunction P: Scheduled TXA nebs  No acute indications for bronchoscopy at this time but will discuss with attending Continue empiric ceftriaxone  and azithromycin  Encourage adequate pulmonary hygiene Optimization of end-stage cardiomyopathy per cardiology Consider palliative care involvement Mobilize as able Aspiration  precautions  Labs   CBC: Recent Labs  Lab 05/26/24 2154 05/27/24 0340  05/28/24 0241  WBC 9.9 9.7 9.1  NEUTROABS  --  7.4  --   HGB 12.0* 11.4* 11.0*  HCT 38.1* 36.4* 34.0*  MCV 95.7 96.3 94.4  PLT 180 181 201    Basic Metabolic Panel: Recent Labs  Lab 05/26/24 2154 05/27/24 0340 05/28/24 0241  NA 134* 132* 134*  K 4.6 4.5 4.5  CL 103 99 98  CO2 21* 22 26  GLUCOSE 110* 99 89  BUN 15 14 24*  CREATININE 0.91 0.94 1.09  CALCIUM  8.4* 8.5* 8.4*  MG  --  1.8  --   PHOS  --  2.7  --    GFR: Estimated Creatinine Clearance: 103.7 mL/min (by C-G formula based on SCr of 1.09 mg/dL). Recent Labs  Lab 05/26/24 2154 05/27/24 0340 05/28/24 0241  PROCALCITON  --  0.26  --   WBC 9.9 9.7 9.1    Liver Function Tests: Recent Labs  Lab 05/27/24 0139 05/28/24 0241  AST 152* 68*  ALT 563* 377*  ALKPHOS 99 95  BILITOT 1.8* 1.0  PROT 7.1 6.7  ALBUMIN 3.3* 3.0*   Recent Labs  Lab 05/27/24 0139  LIPASE 24   No results for input(s): AMMONIA in the last 168 hours.  ABG    Component Value Date/Time   PHART 7.225 (L) 05/09/2017 1528   PCO2ART 52.4 (H) 05/09/2017 1528   PO2ART 50.0 (L) 05/09/2017 1528   HCO3 21.7 05/09/2017 1528   TCO2 23 05/09/2017 1528   ACIDBASEDEF 6.0 (H) 05/09/2017 1528   O2SAT 72.3 02/12/2024 0913     Coagulation Profile: Recent Labs  Lab 05/27/24 0139 05/28/24 0241  INR 1.5* 1.6*    Cardiac Enzymes: No results for input(s): CKTOTAL, CKMB, CKMBINDEX, TROPONINI in the last 168 hours.  HbA1C: Hgb A1c MFr Bld  Date/Time Value Ref Range Status  07/16/2023 05:07 AM 5.7 (H) 4.8 - 5.6 % Final    Comment:    (NOTE) Pre diabetes:          5.7%-6.4%  Diabetes:              >6.4%  Glycemic control for   <7.0% adults with diabetes     CBG: No results for input(s): GLUCAP in the last 168 hours.  Review of Systems:   Please see the history of present illness. All other systems reviewed and are negative   Past Medical  History:  He,  has a past medical history of Anxiety disorder, Gilbert's syndrome, Hearing loss of left ear, and Tinnitus, left.   Surgical History:   Past Surgical History:  Procedure Laterality Date   APPENDECTOMY     when he was youmg, per wife   LEFT HEART CATH AND CORONARY ANGIOGRAPHY N/A 07/16/2023   Procedure: LEFT HEART CATH AND CORONARY ANGIOGRAPHY;  Surgeon: Rolan Ezra RAMAN, MD;  Location: [facility] INVASIVE CV LAB;  Service: Cardiovascular;  Laterality: N/A;     Social History:   reports that he has never smoked. He has never used smokeless tobacco. He reports current drug use. Drugs: Amphetamines and Marijuana. He reports that he does not drink alcohol.   Family History:  His family history includes Stroke in his father and mother.   Allergies Allergies[1]   Home Medications  Prior to Admission medications  Medication Sig Start Date End Date Taking? Authorizing Provider  acetaminophen  (TYLENOL ) 500 MG tablet Take 1,000 mg by mouth 2 (two) times daily as needed for moderate pain (pain score 4-6), headache or fever.   Yes  [provider]  calcium  elemental as carbonate (TUMS ULTRA 1000) 400 MG chewable tablet Chew 1,000 mg by mouth 2 (two) times daily as needed for heartburn (indigestion).   Yes [provider]  dapagliflozin  propanediol (FARXIGA ) 10 MG TABS tablet Take 1 tablet (10 mg total) by mouth daily. 02/24/24  Yes Milford, Harlene HERO, FNP  digoxin  (LANOXIN ) 0.125 MG tablet Take 0.125 mg by mouth daily.   Yes [provider]  diphenhydramine -acetaminophen  (TYLENOL  PM) 25-500 MG TABS tablet Take 1-2 tablets by mouth at bedtime as needed (sleep, pain).   Yes [provider]  warfarin (COUMADIN ) 5 MG tablet Take 1 tablet (5 mg total) by mouth daily. Patient taking differently: Take 2.5-5 mg by mouth See admin instructions. Take 1 tablet (5mg ) by mouth every Monday, Wednesday and Friday in the morning. Take 1/2 tablet (2.5mg ) by mouth on all  other days. 03/22/24  Yes Zenaida Morene PARAS, MD  gabapentin  (NEURONTIN ) 300 MG capsule Take 1 capsule (300 mg total) by mouth at bedtime. Patient not taking: Reported on 05/27/2024 02/15/24   Gomes, Adriana, DO  spironolactone  (ALDACTONE ) 25 MG tablet Take 0.5 tablets (12.5 mg total) by mouth daily. Patient not taking: Reported on 05/27/2024 03/28/24   Glena Harlene HERO, FNP     Critical care time: NA  Eaven Schwager D. Harris, NP-C [facility] Personal contact information can be found on Amion  If no contact or response made please call [phone] 05/28/2024, 2:04 PM             [1] No Known Allergies  "

## 2024-05-29 DIAGNOSIS — J9601 Acute respiratory failure with hypoxia: Secondary | ICD-10-CM

## 2024-05-29 DIAGNOSIS — I5023 Acute on chronic systolic (congestive) heart failure: Secondary | ICD-10-CM

## 2024-05-29 LAB — CBC
HCT: 36.1 % — ABNORMAL LOW (ref 39.0–52.0)
Hemoglobin: 11.8 g/dL — ABNORMAL LOW (ref 13.0–17.0)
MCH: 30.1 pg (ref 26.0–34.0)
MCHC: 32.7 g/dL (ref 30.0–36.0)
MCV: 92.1 fL (ref 80.0–100.0)
Platelets: 240 K/uL (ref 150–400)
RBC: 3.92 MIL/uL — ABNORMAL LOW (ref 4.22–5.81)
RDW: 15.5 % (ref 11.5–15.5)
WBC: 8.4 K/uL (ref 4.0–10.5)
nRBC: 0 % (ref 0.0–0.2)

## 2024-05-29 LAB — COMPREHENSIVE METABOLIC PANEL WITH GFR
ALT: 270 U/L — ABNORMAL HIGH (ref 0–44)
AST: 39 U/L (ref 15–41)
Albumin: 3 g/dL — ABNORMAL LOW (ref 3.5–5.0)
Alkaline Phosphatase: 104 U/L (ref 38–126)
Anion gap: 10 (ref 5–15)
BUN: 22 mg/dL — ABNORMAL HIGH (ref 6–20)
CO2: 27 mmol/L (ref 22–32)
Calcium: 8.3 mg/dL — ABNORMAL LOW (ref 8.9–10.3)
Chloride: 97 mmol/L — ABNORMAL LOW (ref 98–111)
Creatinine, Ser: 1.05 mg/dL (ref 0.61–1.24)
GFR, Estimated: >60 mL/min
Glucose, Bld: 119 mg/dL — ABNORMAL HIGH (ref 70–99)
Potassium: 3.9 mmol/L (ref 3.5–5.1)
Sodium: 133 mmol/L — ABNORMAL LOW (ref 135–145)
Total Bilirubin: 0.8 mg/dL (ref 0.0–1.2)
Total Protein: 7 g/dL (ref 6.5–8.1)

## 2024-05-29 LAB — D-DIMER, QUANTITATIVE: D-Dimer, Quant: 10.35 ug{FEU}/mL — ABNORMAL HIGH (ref 0.00–0.50)

## 2024-05-29 LAB — MAGNESIUM: Magnesium: 2 mg/dL (ref 1.7–2.4)

## 2024-05-29 LAB — TROPONIN T, HIGH SENSITIVITY: Troponin T High Sensitivity: 29 ng/L — ABNORMAL HIGH (ref 0–19)

## 2024-05-29 LAB — PHOSPHORUS: Phosphorus: 3 mg/dL (ref 2.5–4.6)

## 2024-05-29 LAB — PROTIME-INR
INR: 1.5 — ABNORMAL HIGH (ref 0.8–1.2)
Prothrombin Time: 18.4 s — ABNORMAL HIGH (ref 11.4–15.2)

## 2024-05-29 MED ORDER — WARFARIN SODIUM 5 MG PO TABS
10.0000 mg | ORAL_TABLET | Freq: Once | ORAL | Status: AC
  Administered 2024-05-29: 10 mg via ORAL
  Filled 2024-05-29: qty 2

## 2024-05-29 MED ORDER — AMOXICILLIN-POT CLAVULANATE 875-125 MG PO TABS
1.0000 | ORAL_TABLET | Freq: Two times a day (BID) | ORAL | Status: DC
  Administered 2024-05-29 – 2024-05-31 (×4): 1 via ORAL
  Filled 2024-05-29 (×7): qty 1
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
## 2024-05-29 NOTE — Progress Notes (Signed)
 PHARMACY - ANTICOAGULATION CONSULT NOTE  Pharmacy Consult for warfarin Indication: pulmonary embolus  Allergies[1]  Patient Measurements: Height: 6' 6 (198.1 cm) Weight: 96.8 kg (213 lb 8 oz) IBW/kg (Calculated) : 91.4 HEPARIN  DW (KG): 101.2  Vital Signs: Temp: 99.6 F (37.6 C) (01/04 0733) Temp Source: Axillary (01/04 0733) BP: 110/75 (01/04 0733) Pulse Rate: 118 (01/04 0733)  Labs: Recent Labs    05/27/24 0139 05/27/24 0340 05/28/24 0241 05/29/24 0224  HGB  --  11.4* 11.0* 11.8*  HCT  --  36.4* 34.0* 36.1*  PLT  --  181 201 240  LABPROT 19.2*  --  19.6* 18.4*  INR 1.5*  --  1.6* 1.5*  CREATININE  --  0.94 1.09 1.05    Estimated Creatinine Clearance: 107.6 mL/min (by C-G formula based on SCr of 1.05 mg/dL).   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   Past Week   calcium  elemental as carbonate (TUMS ULTRA 1000) 400 MG chewable tablet Chew 1,000 mg by mouth 2 (two) times daily as needed for heartburn (indigestion).   Past Week   dapagliflozin  propanediol (FARXIGA ) 10 MG TABS tablet Take 1 tablet (10 mg total) by mouth daily. 90 tablet 3 05/25/2024   digoxin  (LANOXIN ) 0.125 MG tablet Take 0.125 mg by mouth daily.   05/25/2024   diphenhydramine -acetaminophen  (TYLENOL  PM) 25-500 MG TABS tablet Take 1-2 tablets by mouth at bedtime as needed (sleep, pain).   Past Week   warfarin (COUMADIN ) 5 MG tablet Take 1 tablet (5 mg total) by mouth daily. (Patient taking differently: Take 2.5-5 mg by mouth See admin instructions. Take 1 tablet (5mg ) by mouth every Monday,  Wednesday and Friday in the morning. Take 1/2 tablet (2.5mg ) by mouth on all other days.) 30 tablet 1 05/25/2024   gabapentin  (NEURONTIN ) 300 MG capsule Take 1 capsule (300 mg total) by mouth at bedtime. (Patient not taking: Reported on 05/27/2024) 30 capsule 0 Not Taking   spironolactone  (ALDACTONE ) 25 MG tablet Take 0.5 tablets (12.5 mg total) by mouth daily. (Patient not taking: Reported on 05/27/2024) 45 tablet 11 Not Taking    Assessment: 54yo M presenting with heart failure exacerbation. Patient on warfarin PTA (LD: 1/1 at 9am) for a PE (01/2024). 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 INR on admission was subtherapeutic at 1.5. Given recent PE and subtherapeutic INR, will bridge with lovenox . Pharmacy consulted to dose warfarin.   Home regimen: 5mg  PO daily MWF, 2.5mg  PO daily all other days  05/30/2023: INR 1.5, which is subtherapeutic today after a dose of Warfarin 7.5 mg x 1. CBC stable and no bleeding charted.   Goal of Therapy:  INR 2-3 Monitor platelets by anticoagulation protocol: Yes   Plan:  Warfarin 10mg  PO x1 Lovenox  150mg  subcutaneous (1.5mg /kg) daily Daily INR and CBC  R. Samual Satterfield, PharmD PGY-1 Acute Care Pharmacy Resident [facility] Please refer to [facility] for [facility] Pharmacy numbers 05/29/2024 8:05 AM            [1] No Known  Allergies

## 2024-05-29 NOTE — Progress Notes (Signed)
 "  NAME:  Anthony Singleton, MRN:  [id number], DOB:  1970-08-20, LOS: 2 ADMISSION DATE:  05/26/2024, CONSULTATION DATE:  05/28/24 REFERRING MD: TRH, CHIEF COMPLAINT: Hemoptysis  History of Present Illness:  Anthony Singleton is a 54 year old male with a past medical history significant for combined systolic and diastolic congestive heart failure, prior pulmonary embolism anticoagulated at baseline with Coumadin , hearing loss in left ear Gilberts syndrome, and anxiety disorder who presented to the ED at [facility] 1/1 for complaints of shortness of breath and cough with associated hemoptysis.  Also reported persistent fluid retention with associated lower extremity swelling and abdominal discomfort.  Workup on admission concerning for combined systolic and diastolic congestive heart failure exacerbation patient was admitted per hospitalist.  Pertinent  Medical History  Combined systolic and diastolic congestive heart failure, prior pulmonary embolism anticoagulated at baseline with Coumadin , hearing loss in left ear Gilberts syndrome, and anxiety disorde  Significant Hospital Events: Including procedures, antibiotic start and stop dates in addition to other pertinent events   1/2 presented for multiple complaints including chest pain, abdominal pain, swelling, cough, and hemoptysis 1/3 PCCM consulted for assistance in managing hemoptysis 1/4 no significant events overnight patient reports no significant change to hemoptysis over the last 24 hours  Interim History / Subjective:  Patient is seen in the side-lying position asleep on my entry.  When trying to engage with the patient his main request is I'd like to continue to sleep but he did report hemoptysis has not changed significantly and is mostly associated with movement.  Objective    Blood pressure 110/75, pulse (!) 118, temperature 99.6 F (37.6 C), temperature source Axillary, resp. rate 20, height 6' 6 (1.981 m), weight 96.8 kg,  SpO2 97%.        Intake/Output Summary (Last 24 hours) at 05/29/2024 0750 Last data filed at 05/28/2024 2211 Gross per 24 hour  Intake 240 ml  Output --  Net 240 ml   Filed Weights   05/27/24 1141 05/28/24 0312 05/29/24 0500  Weight: 101.2 kg 99.7 kg 96.8 kg    Examination: General: Acute on chronic ill-appearing middle-aged male lying in bed in no acute distress HEENT: [place]/AT, MM pink/moist, PERRL,  Neuro: Alert and oriented x 3, nonfocal, flat affect CV: s1s2 regular rate and rhythm, no murmur, rubs, or gallops,  PULM: Acute crackles to bilateral bases, no increased work of breathing, on room air GI: soft, bowel sounds active in all 4 quadrants, non-tender, non-distended, tolerating diet Extremities: warm/dry, no edema  Skin: no rashes or lesions  Resolved problem list   Assessment and Plan  New onset hemoptysis -Differential diagnosis includes functional mitral regurgitation causing potential DAH type picture in the setting of end-stage cardiomyopathy and compliance issues versus pulmonary/alveolar irritation in the setting of persistent cough due to CAP with supratherapeutic INR Community-acquired pneumonia -Patient presented with dyspnea and cough with associated hemoptysis CTA negative for acute PE but did reveal new bibasilar pulmonary consolidation concerning for multifocal pneumonia or aspiration with a right impaction of the right lower lobe and volume loss in the left lower lobe.  However procalcitonin is within normal Bilateral pleural effusions total volume removal -History of right parapneumonic effusion, s/p smallbore chest tube placed February 2025 History of pulmonary embolism anticoagulated with Coumadin  at baseline Supratherapeutic INR End-stage cardiomyopathy - Most recent echocardiogram September 2025 with EF less than 20% and grade 2 diastolic dysfunction P: Continue scheduled TXA nebs  Given abnormal CT reasonable to continue short course ceftriaxone  and  azithromycin  but would have a low threshold to stop will defer to primary team Encourage adequate pulmonary hygiene Optimize end-stage cardiomyopathy as able Mobilize Aspiration precautions Consider palliative care involvement  Critical care time: NA  Kenshawn Maciolek D. Harris, NP-C [facility] Personal contact information can be found on Amion  If no contact or response made please call [phone] 05/29/2024, 7:50 AM           "

## 2024-05-29 NOTE — Progress Notes (Signed)
" °   05/28/24 2203  Assess: MEWS Score  Temp 97.7 F (36.5 C)  BP 125/85  MAP (mmHg) 97  Pulse Rate (!) 118  ECG Heart Rate (!) 117  Resp 20  Level of Consciousness Alert  SpO2 97 %  O2 Device Room Air  Assess: MEWS Score  MEWS Temp 0  MEWS Systolic 0  MEWS Pulse 2  MEWS RR 0  MEWS LOC 0  MEWS Score 2  MEWS Score Color Yellow  Assess: if the MEWS score is Yellow or Red  Were vital signs accurate and taken at a resting state? Yes  Does the patient meet 2 or more of the SIRS criteria? No  MEWS guidelines implemented  Yes, yellow  Treat  MEWS Interventions Considered administering scheduled or prn medications/treatments as ordered  Take Vital Signs  Increase Vital Sign Frequency  Yellow: Q2hr x1, continue Q4hrs until patient remains green for 12hrs  Escalate  MEWS: Escalate Yellow: Discuss with charge nurse and consider notifying provider and/or RRT  Notify: Charge Nurse/RN  Name of Charge Nurse/RN Notified Vertell, RN  Assess: SIRS CRITERIA  SIRS Temperature  0  SIRS Respirations  0  SIRS Pulse 1  SIRS WBC 0  SIRS Score Sum  1    "

## 2024-05-29 NOTE — Progress Notes (Signed)
 " PROGRESS NOTE    Anthony Singleton  FMW:[id number] DOB: 1970-11-05 DOA: 05/26/2024 PCP: Sirivol, Mamatha, MD   Brief Narrative:  This 54 yrs old Male with PMH significant for combined systolic and diastolic heart failure, pulmonary embolism, Gilbert syndrome, hearing loss in the left ear, and anxiety presented in the ED with chest pain and coughing up blood.  He describes chest pain is so intense that he cannot even touch his chest , describing blood as bright red, pain radiating down to his stomach and prevented him from eating or drinking. He is on warfarin and taking it as prescribed. He regularly follows up with cardiologist. He also reports significant foot swelling that he could not put on his shoes.  His symptoms have significantly impacted his daily life including his ability to sleep and eat or perform normal activities. In the ED his heart rate noted to be in 130s, blood pressure 155/89.  Significant labs include proBNP 5425, Trop 27> 29, Creatinine 3.3, AST 152, ALT 563, total bilirubin 1.8,  INR 1.5.  CTA chest ruled out pulmonary embolism but shows bilateral bibasilar pulmonary consolidation likely multifocal infection or aspiration, right pleural effusion slightly enlarged, new shotty mediastinal lymphadenopathy and marked distal esophageal wall thickening concerning for esophagitis with small lateral hernia.  CT abdomen and pelvis no acute abnormality found.  Patient was admitted for further evaluation and started on empiric antibiotics,  ceftriaxone  and Zithromax  and IV Lasix .  Pulmonology and cardiology is consulted.  Assessment & Plan:   Principal Problem:   Acute on chronic combined systolic and diastolic heart failure (HCC) Active Problems:   CAP (community acquired pneumonia)   SIRS (systemic inflammatory response syndrome) (HCC)   Elevated troponin   History of pulmonary embolism   Subtherapeutic international normalized ratio (INR)   GERD with esophagitis   Normocytic  anemia   Hyponatremia   History of seizure   Elevated liver enzymes   Gilbert syndrome   NICM (nonischemic cardiomyopathy) (HCC)   Chronic pulmonary embolism (HCC)  Acute on chronic combined systolic and diastolic CHF: Patient presented with lower extremity edema, JVD+, proBNP 5425. CTA chest ruled out pulmonary embolism but shows slightly enlarged right-sided pleural effusion with stable left pleural effusion. Patient reports compliance with his medical regimen. Last echocardiogram showed LVEF less than 20% with grade 2 diastolic dysfunction Monitor daily weight, intake output charting. Continue IV Lasix  40 mg twice daily.  Continue digoxin  0.125 mg daily Repeat Echo showed EF less than 20% left ventricle has severely decreased function left ventricle demonstrate global hypokinesis right ventricular systolic function severely reduced. Cardiology is consulted.  Continue IV Lasix  40 twice daily, continue GDMT (Entresto , Farxiga , spironolactone ). Patient needs advanced heart failure team assessment.  Sepsis : Suspected community-acquired pneumonia: Patient reports coughing up blood.  Noted to be tachycardic and tachypneic meeting sepsis criteria.   CTA of the chest concerning for new bibasilar pulmonary consolidations concerning for multifocal infection or aspiration.  Procalcitonin noted to be 0.26.   Continue empiric antibiotics ceftriaxone  and Zithromax . Blood cultures no growth for 2 days. Continue supplemental oxygen to maintain O2 saturation greater than 92% Incentive spirometry and flutter valve Influenza, RSV, COVID, RVP negative.  Hemoptysis :  It could be multifactorial, acute on chronic systolic CHF, pneumonia, history of PE with subtherapeutic INR. Continue IV antibiotics and IV diuresis. Pulmonology is consulted.  Continue scheduled TXA nebs.  Recommended palliative care referral made.   Elevated troponin: High-sensitivity troponins 27->29.   EKG without significant  ischemic  changes.   Suspect secondary to demand ischemia in the setting of infection.. Continue to monitor   History of pulmonary embolism: Subtherapeutic INR Patient were found to have concern for pulmonary embolism as well as RV thrombus during hospitalization back in September.  INR noted to be subtherapeutic at 1.5.  No signs of a pulmonary embolism noted on the CT of the chest.  Patient supposed to be on Coumadin . Coumadin  per pharmacy with Lovenox  bridge.   GERD with esophagitis: Patient reports substernal and abdominal pain.   CT noted marked distal esophageal wall thickening concerning for esophagitis with small hiatal hernia.  Suspect this could be the cause of patient's symptoms.  Patient had been given fentanyl  for pain. Continue Protonix  IV twice daily. Continue Carafate  3 times daily with meals Adequate Pain control as needed.   Normocytic anemia: Hemoglobin 12->11.4.   Patient reports hemoptysis, but no significant hemoptysis appreciated.   Vital signs otherwise noted to be stable. Continue to monitor H&H   Hyponatremia: Initial sodium noted to be 134->132.   Possibly secondary to patient being fluid overloaded.   Elevated liver function studies: Gilbert syndrome Labs revealed AST 152, ALT 563, and total bilirubin 1.8 with indirect bilirubin 1.3.  Possibly related with hepatic congestion. 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  Not on any antiepileptic medications at this time. Continue Seizure precautions Continue Ativan  IV as needed for seizure activity.  DVT prophylaxis: Coumadin . Code Status: Full code. Family Communication: Son at bed side. Disposition Plan:     Status is: Inpatient Remains inpatient appropriate because: Patient admitted for chest pain with hemoptysis and fluid overload , started on  IV diuresis and IV antibiotics.  Pulmonology  and cardiology following.  Patient is not medically ready for discharge.   Consultants:  Pulmonology Cardiology  Procedures: CTA chest  Antimicrobials:  Anti-infectives (From admission, onward)    Start     Dose/Rate Route Frequency Ordered Stop   05/29/24 1000  amoxicillin -clavulanate (AUGMENTIN ) 875-125 MG per tablet 1 tablet        1 tablet Oral Every 12 hours 05/29/24 0820 06/02/24 0959   05/28/24 0400  cefTRIAXone  (ROCEPHIN ) 1 g in sodium chloride  0.9 % 100 mL IVPB  Status:  Discontinued        1 g 200 mL/hr over 30 Minutes Intravenous Every 24 hours 05/27/24 0316 05/27/24 0740   05/28/24 0400  cefTRIAXone  (ROCEPHIN ) 2 g in sodium chloride  0.9 % 100 mL IVPB  Status:  Discontinued        2 g 200 mL/hr over 30 Minutes Intravenous Every 24 hours 05/27/24 0740 05/29/24 0820   05/27/24 0400  azithromycin  (ZITHROMAX ) 500 mg in sodium chloride  0.9 % 250 mL IVPB  Status:  Discontinued        500 mg 250 mL/hr over 60 Minutes Intravenous Every 24 hours 05/27/24 0316 05/29/24 0820   05/27/24 0245  cefTRIAXone  (ROCEPHIN ) 2 g in sodium chloride  0.9 % 100 mL IVPB        2 g 200 mL/hr over 30 Minutes Intravenous  Once 05/27/24 0236 05/27/24 0413   05/27/24 0245  azithromycin  (ZITHROMAX ) 500 mg in sodium chloride  0.9 % 250 mL IVPB  Status:  Discontinued        500 mg 250 mL/hr over 60 Minutes Intravenous  Once 05/27/24 0236 05/27/24 0320  Subjective: Patient was seen and examined at bedside.  Overnight events noted. Patient reports feeling slightly better but still coughing up blood. He reports peeing a lot.  Objective: Vitals:   05/29/24 0500 05/29/24 0515 05/29/24 0733 05/29/24 1042  BP:  112/73 110/75 (!) 88/60  Pulse:  (!) 120 (!) 118 (!) 116  Resp:  18 20 18   Temp:  99.1 F (37.3 C) 99.6 F (37.6 C) (!) 97 F (36.1 C)  TempSrc:  Oral Axillary Axillary  SpO2:  96% 97% 98%  Weight: 96.8 kg     Height:         Intake/Output Summary (Last 24 hours) at 05/29/2024 1345 Last data filed at 05/29/2024 0857 Gross per 24 hour  Intake 594 ml  Output --  Net 594 ml   Filed Weights   05/27/24 1141 05/28/24 0312 05/29/24 0500  Weight: 101.2 kg 99.7 kg 96.8 kg    Examination:  General exam: Appears calm and comfortable, not in any acute distress. Deconditioned. Respiratory system: CTA Bilaterally.  Respiratory effort normal.  RR 14 Cardiovascular system: S1 & S2 heard, RRR. No JVD, murmurs, rubs, gallops or clicks.  Gastrointestinal system: Abdomen is non distended, soft and non tender.  Normal bowel sounds heard. Central nervous system: Alert and oriented x 3. No focal neurological deficits. Extremities: No edema, no cyanosis, no clubbing. Skin: No rashes, lesions or ulcers Psychiatry: Judgement and insight appear normal. Mood & affect appropriate.     Data Reviewed: I have personally reviewed following labs and imaging studies  CBC: Recent Labs  Lab 05/26/24 2154 05/27/24 0340 05/28/24 0241 05/29/24 0224  WBC 9.9 9.7 9.1 8.4  NEUTROABS  --  7.4  --   --   HGB 12.0* 11.4* 11.0* 11.8*  HCT 38.1* 36.4* 34.0* 36.1*  MCV 95.7 96.3 94.4 92.1  PLT 180 181 201 240   Basic Metabolic Panel: Recent Labs  Lab 05/26/24 2154 05/27/24 0340 05/28/24 0241 05/29/24 0224  NA 134* 132* 134* 133*  K 4.6 4.5 4.5 3.9  CL 103 99 98 97*  CO2 21* 22 26 27   GLUCOSE 110* 99 89 119*  BUN 15 14 24* 22*  CREATININE 0.91 0.94 1.09 1.05  CALCIUM  8.4* 8.5* 8.4* 8.3*  MG  --  1.8  --  2.0  PHOS  --  2.7  --  3.0   GFR: Estimated Creatinine Clearance: 107.6 mL/min (by C-G formula based on SCr of 1.05 mg/dL). Liver Function Tests: Recent Labs  Lab 05/27/24 0139 05/28/24 0241 05/29/24 0224  AST 152* 68* 39  ALT 563* 377* 270*  ALKPHOS 99 95 104  BILITOT 1.8* 1.0 0.8  PROT 7.1 6.7 7.0  ALBUMIN 3.3* 3.0* 3.0*   Recent Labs  Lab 05/27/24 0139  LIPASE 24   No results for input(s): AMMONIA in  the last 168 hours. Coagulation Profile: Recent Labs  Lab 05/27/24 0139 05/28/24 0241 05/29/24 0224  INR 1.5* 1.6* 1.5*   Cardiac Enzymes: No results for input(s): CKTOTAL, CKMB, CKMBINDEX, TROPONINI in the last 168 hours. BNP (last 3 results) Recent Labs    05/27/24 0139  PROBNP 5,425.0*   HbA1C: No results for input(s): HGBA1C in the last 72 hours. CBG: No results for input(s): GLUCAP in the last 168 hours. Lipid Profile: No results for input(s): CHOL, HDL, LDLCALC, TRIG, CHOLHDL, LDLDIRECT in the last 72 hours. Thyroid Function Tests: No results for input(s): TSH, T4TOTAL, FREET4, T3FREE, THYROIDAB in the last 72 hours. Anemia Panel: No results for  input(s): VITAMINB12, FOLATE, FERRITIN, TIBC, IRON, RETICCTPCT in the last 72 hours. Sepsis Labs: Recent Labs  Lab 05/27/24 0340  PROCALCITON 0.26    Recent Results (from the past 240 hours)  Blood culture (routine x 2)     Status: None (Preliminary result)   Collection Time: 05/27/24  3:00 AM   Specimen: BLOOD  Result Value Ref Range Status   Specimen Description BLOOD LEFT ANTECUBITAL  Final   Special Requests   Final    BOTTLES DRAWN AEROBIC AND ANAEROBIC Blood Culture adequate volume   Culture   Final    NO GROWTH 2 DAYS Performed at [facility], [address], [place], [place] [address]    Report Status PENDING  Incomplete  Blood culture (routine x 2)     Status: None (Preliminary result)   Collection Time: 05/27/24  3:40 AM   Specimen: BLOOD  Result Value Ref Range Status   Specimen Description BLOOD RIGHT ANTECUBITAL  Final   Special Requests   Final    BOTTLES DRAWN AEROBIC AND ANAEROBIC Blood Culture adequate volume   Culture   Final    NO GROWTH 2 DAYS Performed at [facility], [address], [place], [place] [address]    Report Status PENDING  Incomplete  Resp panel by RT-PCR (RSV, Flu A&B, Covid) Anterior Nasal Swab     Status: None    Collection Time: 05/27/24  7:43 AM   Specimen: Anterior Nasal Swab  Result Value Ref Range Status   SARS Coronavirus 2 by RT PCR NEGATIVE NEGATIVE Final   Influenza A by PCR NEGATIVE NEGATIVE Final   Influenza B by PCR NEGATIVE NEGATIVE Final    Comment: (NOTE) The Xpert Xpress SARS-CoV-2/FLU/RSV plus assay is intended as an aid in the diagnosis of influenza from Nasopharyngeal swab specimens and should not be used as a sole basis for treatment. Nasal washings and aspirates are unacceptable for Xpert Xpress SARS-CoV-2/FLU/RSV testing.  Fact Sheet for Patients: bloggercourse.com  Fact Sheet for Healthcare Providers: seriousbroker.it  This test is not yet approved or cleared by the [place]  FDA and has been authorized for detection and/or diagnosis of SARS-CoV-2 by FDA under an Emergency Use Authorization (EUA). This EUA will remain in effect (meaning this test can be used) for the duration of the COVID-19 declaration under Section 564(b)(1) of the Act, 21 U.S.C. section 360bbb-3(b)(1), unless the authorization is terminated or revoked.     Resp Syncytial Virus by PCR NEGATIVE NEGATIVE Final    Comment: (NOTE) Fact Sheet for Patients: bloggercourse.com  Fact Sheet for Healthcare Providers: seriousbroker.it  This test is not yet approved or cleared by the [place]  FDA and has been authorized for detection and/or diagnosis of SARS-CoV-2 by FDA under an Emergency Use Authorization (EUA). This EUA will remain in effect (meaning this test can be used) for the duration of the COVID-19 declaration under Section 564(b)(1) of the Act, 21 U.S.C. section 360bbb-3(b)(1), unless the authorization is terminated or revoked.  Performed at [facility], [address] [place], [place] [address]     Radiology Studies: ECHOCARDIOGRAM COMPLETE Result Date: 05/28/2024     ECHOCARDIOGRAM REPORT   Patient Name:   Anthony Singleton Date of Exam: 05/28/2024 Medical Rec #:  [id number]             Height:       78.0 in Accession #:    [id number]  Weight:       219.7 lb Date of Birth:  1971/01/16             BSA:          2.349 m Patient Age:    53 years              BP:           134/93 mmHg Patient Gender: M                     HR:           130 bpm. Exam Location:  Inpatient Procedure: 2D Echo, Color Doppler, Cardiac Doppler and Intracardiac            Opacification Agent (Both Spectral and Color Flow Doppler were            utilized during procedure). Indications:    CHF I50.9  History:        Patient has prior history of Echocardiogram examinations, most                 recent 02/09/2024.  Sonographer:    Tinnie Gosling RDCS Referring Phys: [id number] LOGAN N LOCKWOOD IMPRESSIONS  1. Left ventricular ejection fraction, by estimation, is <20%. The left ventricle has severely decreased function. The left ventricle demonstrates global hypokinesis. The left ventricular internal cavity size was severely dilated. Left ventricular diastolic function could not be evaluated.  2. Right ventricular systolic function is severely reduced. The right ventricular size is moderately enlarged. Tricuspid regurgitation signal is inadequate for assessing PA pressure.  3. The mitral valve is normal in structure. Trivial mitral valve regurgitation. No evidence of mitral stenosis.  4. The aortic valve is normal in structure. Aortic valve regurgitation is not visualized. No aortic stenosis is present.  5. The inferior vena cava is normal in size with <50% respiratory variability, suggesting right atrial pressure of 8 mmHg. FINDINGS  Left Ventricle: Left ventricular ejection fraction, by estimation, is <20%. The left ventricle has severely decreased function. The left ventricle demonstrates global hypokinesis. The left ventricular internal cavity size was severely dilated. There is no left ventricular  hypertrophy. Left ventricular diastolic function could not be evaluated. Right Ventricle: The right ventricular size is moderately enlarged. No increase in right ventricular wall thickness. Right ventricular systolic function is severely reduced. Tricuspid regurgitation signal is inadequate for assessing PA pressure. Left Atrium: Left atrial size was normal in size. Right Atrium: Right atrial size was normal in size. Pericardium: There is no evidence of pericardial effusion. Mitral Valve: The mitral valve is normal in structure. Trivial mitral valve regurgitation. No evidence of mitral valve stenosis. Tricuspid Valve: The tricuspid valve is normal in structure. Tricuspid valve regurgitation is not demonstrated. No evidence of tricuspid stenosis. Aortic Valve: The aortic valve is normal in structure. Aortic valve regurgitation is not visualized. No aortic stenosis is present. Pulmonic Valve: The pulmonic valve was normal in structure. Pulmonic valve regurgitation is trivial. No evidence of pulmonic stenosis. Aorta: The aortic root is normal in size and structure. Venous: The inferior vena cava is normal in size with less than 50% respiratory variability, suggesting right atrial pressure of 8 mmHg. IAS/Shunts: No atrial level shunt detected by color flow Doppler.  LEFT VENTRICLE PLAX 2D LVIDd:         6.60 cm      Diastology LVIDs:         5.40 cm      LV  e' medial:  5.11 cm/s LV PW:         1.00 cm      LV e' lateral: 7.83 cm/s LV IVS:        0.90 cm LVOT diam:     2.38 cm LV SV:         44 LV SV Index:   19 LVOT Area:     4.45 cm  LV Volumes (MOD) LV vol d, MOD A2C: 319.0 ml LV vol d, MOD A4C: 242.0 ml LV vol s, MOD A2C: 239.0 ml LV vol s, MOD A4C: 192.0 ml LV SV MOD A2C:     80.0 ml LV SV MOD A4C:     242.0 ml LV SV MOD BP:      71.8 ml RIGHT VENTRICLE            IVC RV Basal diam:  4.89 cm    IVC diam: 1.43 cm RV S prime:     8.70 cm/s TAPSE (M-mode): 1.1 cm LEFT ATRIUM             Index        RIGHT ATRIUM            Index LA diam:        4.28 cm 1.82 cm/m   RA Area:     19.00 cm LA Vol (A2C):   60.1 ml 25.58 ml/m  RA Volume:   59.30 ml  25.24 ml/m LA Vol (A4C):   69.0 ml 29.37 ml/m LA Biplane Vol: 66.3 ml 28.22 ml/m  AORTIC VALVE LVOT Vmax:   75.80 cm/s LVOT Vmean:  56.400 cm/s LVOT VTI:    0.099 m  AORTA Ao Root diam: 3.24 cm Ao Asc diam:  3.18 cm  SHUNTS Systemic VTI:  0.10 m Systemic Diam: 2.38 cm Anthony Bihari MD Electronically signed by Anthony Bihari MD Signature Date/Time: 05/28/2024/4:22:16 PM    Final    Scheduled Meds:  amoxicillin -clavulanate  1 tablet Oral Q12H   dapagliflozin  propanediol  10 mg Oral Daily   digoxin   0.125 mg Oral Daily   enoxaparin  (LOVENOX ) injection  150 mg Subcutaneous Q24H   furosemide   40 mg Intravenous BID   guaiFENesin   600 mg Oral BID   pantoprazole  (PROTONIX ) IV  40 mg Intravenous Q12H   sacubitril -valsartan   1 tablet Oral BID   sodium chloride  flush  3 mL Intravenous Q12H   spironolactone   25 mg Oral Daily   sucralfate   1 g Oral TID WC & HS   tranexamic acid   500 mg Nebulization Q8H   warfarin  10 mg Oral ONCE-1600   Warfarin - Pharmacist Dosing Inpatient   Does not apply q1600   Continuous Infusions:     LOS: 2 days    Time spent: 50 mins    Darcel Dawley, MD [facility]   If 7PM-7AM, please contact night-coverage  "

## 2024-05-29 NOTE — Plan of Care (Signed)
  Problem: Education: Goal: Knowledge of General Education information will improve Description: Including pain rating scale, medication(s)/side effects and non-pharmacologic comfort measures Outcome: Progressing   Problem: Clinical Measurements: Goal: Will remain free from infection Outcome: Progressing   Problem: Clinical Measurements: Goal: Respiratory complications will improve Outcome: Progressing   Problem: Clinical Measurements: Goal: Diagnostic test results will improve Outcome: Progressing

## 2024-05-30 ENCOUNTER — Encounter: Payer: Self-pay | Attending: Family Medicine

## 2024-05-30 DIAGNOSIS — I5021 Acute systolic (congestive) heart failure: Secondary | ICD-10-CM

## 2024-05-30 DIAGNOSIS — I5043 Acute on chronic combined systolic (congestive) and diastolic (congestive) heart failure: Secondary | ICD-10-CM | POA: Diagnosis not present

## 2024-05-30 HISTORY — PX: RIGHT HEART CATH: CATH118263

## 2024-05-30 LAB — CBC
HCT: 38.9 % — ABNORMAL LOW (ref 39.0–52.0)
Hemoglobin: 12.7 g/dL — ABNORMAL LOW (ref 13.0–17.0)
MCH: 30.5 pg (ref 26.0–34.0)
MCHC: 32.6 g/dL (ref 30.0–36.0)
MCV: 93.3 fL (ref 80.0–100.0)
Platelets: 330 K/uL (ref 150–400)
RBC: 4.17 MIL/uL — ABNORMAL LOW (ref 4.22–5.81)
RDW: 15.3 % (ref 11.5–15.5)
WBC: 8 K/uL (ref 4.0–10.5)
nRBC: 0 % (ref 0.0–0.2)

## 2024-05-30 LAB — PHOSPHORUS: Phosphorus: 2.3 mg/dL — ABNORMAL LOW (ref 2.5–4.6)

## 2024-05-30 LAB — POCT I-STAT EG7
Acid-Base Excess: 3 mmol/L — ABNORMAL HIGH (ref 0.0–2.0)
Acid-Base Excess: 3 mmol/L — ABNORMAL HIGH (ref 0.0–2.0)
Bicarbonate: 28.3 mmol/L — ABNORMAL HIGH (ref 20.0–28.0)
Bicarbonate: 28.4 mmol/L — ABNORMAL HIGH (ref 20.0–28.0)
Calcium, Ion: 1.11 mmol/L — ABNORMAL LOW (ref 1.15–1.40)
Calcium, Ion: 1.13 mmol/L — ABNORMAL LOW (ref 1.15–1.40)
HCT: 38 % — ABNORMAL LOW (ref 39.0–52.0)
HCT: 38 % — ABNORMAL LOW (ref 39.0–52.0)
Hemoglobin: 12.9 g/dL — ABNORMAL LOW (ref 13.0–17.0)
Hemoglobin: 12.9 g/dL — ABNORMAL LOW (ref 13.0–17.0)
O2 Saturation: 45 %
O2 Saturation: 45 %
Potassium: 3.9 mmol/L (ref 3.5–5.1)
Potassium: 4 mmol/L (ref 3.5–5.1)
Sodium: 138 mmol/L (ref 135–145)
Sodium: 139 mmol/L (ref 135–145)
TCO2: 30 mmol/L (ref 22–32)
TCO2: 30 mmol/L (ref 22–32)
pCO2, Ven: 45.6 mmHg (ref 44–60)
pCO2, Ven: 46.4 mmHg (ref 44–60)
pH, Ven: 7.394 (ref 7.25–7.43)
pH, Ven: 7.402 (ref 7.25–7.43)
pO2, Ven: 25 mmHg — CL (ref 32–45)
pO2, Ven: 25 mmHg — CL (ref 32–45)

## 2024-05-30 LAB — BASIC METABOLIC PANEL WITH GFR
Anion gap: 11 (ref 5–15)
BUN: 25 mg/dL — ABNORMAL HIGH (ref 6–20)
CO2: 23 mmol/L (ref 22–32)
Calcium: 8.1 mg/dL — ABNORMAL LOW (ref 8.9–10.3)
Chloride: 100 mmol/L (ref 98–111)
Creatinine, Ser: 1.05 mg/dL (ref 0.61–1.24)
GFR, Estimated: >60 mL/min
Glucose, Bld: 136 mg/dL — ABNORMAL HIGH (ref 70–99)
Potassium: 3.8 mmol/L (ref 3.5–5.1)
Sodium: 134 mmol/L — ABNORMAL LOW (ref 135–145)

## 2024-05-30 LAB — PROTIME-INR
INR: 1.8 — ABNORMAL HIGH (ref 0.8–1.2)
Prothrombin Time: 21.8 s — ABNORMAL HIGH (ref 11.4–15.2)

## 2024-05-30 LAB — MAGNESIUM: Magnesium: 2.1 mg/dL (ref 1.7–2.4)

## 2024-05-30 MED ORDER — MIDAZOLAM HCL (PF) 2 MG/2ML IJ SOLN
INTRAMUSCULAR | Status: DC | PRN
  Administered 2024-05-30: 1 mg via INTRAVENOUS

## 2024-05-30 MED ORDER — MIDAZOLAM HCL 2 MG/2ML IJ SOLN
INTRAMUSCULAR | Status: AC
  Filled 2024-05-30: qty 2

## 2024-05-30 MED ORDER — SODIUM CHLORIDE 0.9% FLUSH: 3.0000 mL | Freq: Two times a day (BID) | INTRAVENOUS | Status: DC

## 2024-05-30 MED ORDER — WARFARIN SODIUM 7.5 MG PO TABS
7.5000 mg | ORAL_TABLET | Freq: Once | ORAL | Status: AC
  Administered 2024-05-30: 7.5 mg via ORAL
  Filled 2024-05-30: qty 1

## 2024-05-30 MED ORDER — LIDOCAINE HCL (PF) 1 % IJ SOLN
INTRAMUSCULAR | Status: AC
  Filled 2024-05-30: qty 30

## 2024-05-30 MED ORDER — HYDROMORPHONE HCL 1 MG/ML IJ SOLN
0.5000 mg | INTRAMUSCULAR | Status: DC | PRN
  Administered 2024-05-30 – 2024-05-31 (×2): 0.5 mg via INTRAVENOUS
  Filled 2024-05-30 (×2): qty 1

## 2024-05-30 MED ORDER — SODIUM CHLORIDE 0.9 % IV SOLN: 250.0000 mL | INTRAVENOUS | Status: DC | PRN

## 2024-05-30 MED ORDER — LIDOCAINE HCL (PF) 1 % IJ SOLN
INTRAMUSCULAR | Status: DC | PRN
  Administered 2024-05-30: 5 mL

## 2024-05-30 MED ORDER — FENTANYL CITRATE (PF) 100 MCG/2ML IJ SOLN
INTRAMUSCULAR | Status: AC
  Filled 2024-05-30: qty 2

## 2024-05-30 MED ORDER — SODIUM CHLORIDE 0.9% FLUSH: 3.0000 mL | INTRAVENOUS | Status: DC | PRN

## 2024-05-30 MED ORDER — FENTANYL CITRATE (PF) 100 MCG/2ML IJ SOLN
INTRAMUSCULAR | Status: DC | PRN
  Administered 2024-05-30: 50 ug via INTRAVENOUS

## 2024-05-30 MED ORDER — HEPARIN (PORCINE) IN NACL 1000-0.9 UT/500ML-% IV SOLN
INTRAVENOUS | Status: DC | PRN
  Administered 2024-05-30: 500 mL

## 2024-05-30 MED ORDER — POTASSIUM PHOSPHATES 15 MMOLE/5ML IV SOLN
15.0000 mmol | Freq: Once | INTRAVENOUS | Status: AC
  Administered 2024-05-30: 15 mmol via INTRAVENOUS
  Filled 2024-05-30: qty 5

## 2024-05-30 NOTE — Progress Notes (Signed)
 " PROGRESS NOTE  Anthony Singleton  FMW:[id number] DOB: [birth year] DOA: 05/26/2024 PCP: Sirivol, Mamatha, MD   Brief Narrative: Patient is a 54 year old male with history of combined systolic/diastolic CHF, pulmonary embolism, anxiety who presented with chest pain, blood in the sputum.  Patient takes warfarin.  On prednisone he was in sinus tachycardia, elevated blood pressure.  Lab work showed elevated proBNP, creatinine 3.3, elevated LFTs.  CTA chest ruled out PE but showed bilateral bibasilar pulmonary consolidation likely multifocal infection or aspiration, right pleural effusion, mediastinal lymphadenopathy, distal esophageal wall thickening.  Started on antibiotics for community-acquired pneumonia.  Pulmonology, cardiology consulted.  Underwent right heart cath today.  Assessment & Plan:  Principal Problem:   Acute on chronic combined systolic and diastolic heart failure (HCC) Active Problems:   CAP (community acquired pneumonia)   SIRS (systemic inflammatory response syndrome) (HCC)   Elevated troponin   History of pulmonary embolism   Subtherapeutic international normalized ratio (INR)   GERD with esophagitis   Normocytic anemia   Hyponatremia   History of seizure   Elevated liver enzymes   Gilbert syndrome   NICM (nonischemic cardiomyopathy) (HCC)   Chronic pulmonary embolism (HCC)  Acute on chronic combined systolic/diastolic CHF: Presented with lower extremity edema, elevated JVP, elevated proBNP.  Chest imaging showed right-sided pleural effusion.  Last echo had shown EF of less than 20%, grade 2 diastolic dysfunction.  Started on IV Lasix , digoxin .  Repeat imaging showed EF of less than 20%, severely decreased left ventricular systolic function, global hypokinesis.  Advanced heart failure team following.  Underwent right heart cath today  Hemoptysis /community-acquired pneumonia: Reported coughing up blood.  CTA chest showed new bibasilar pulmonary consolidation concerning  for multifocal infection/aspiration.  Currently on ceftriaxone , erythromycin.  Blood cultures no growth to date.  Influenza/RSV/flu negative.  PCCM was consulted for hemoptysis.  Recommended to continue current antibiotics.  Stat CT angiogram for worsening hemoptysis and consideration of bronchoscopy later this week if no improvement  Elevated troponin: Mild elevated with flat trend, EKG without significant ischemic changes.  Likely from demand ischemia  History of EZ:Ejupzwu were found to have concern for pulmonary embolism as well as RV thrombus during hospitalization back in September.  Currently on Coumadin   GERD/esophagitis: Protonix , Carafate   Normocytic anemia: Currently hemoglobin stable  History of Gilbert syndrome/elevated liver enzymes: Elevated liver enzymes.  Likely from hepatic congestion from CHF.  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  Not on any antiepileptic medications at this time. Continue Seizure precautions  Hypophosphatemia: Supplemented with phosphorus         DVT prophylaxis:SCDs Start: 05/27/24 9687     Code Status: Full Code  Family Communication: None at bedside  Patient status:Inpatient  Patient is from :home  Anticipated discharge un:ynfz  Estimated DC date: After cardiology clearance   Consultants: Cardiology, PCCM  Procedures: Cardiac cath  Antimicrobials:  Anti-infectives (From admission, onward)    Start     Dose/Rate Route Frequency Ordered Stop   05/29/24 1000  amoxicillin -clavulanate (AUGMENTIN ) 875-125 MG per tablet 1 tablet        1 tablet Oral Every 12 hours 05/29/24 0820 06/02/24 0959   05/28/24 0400  cefTRIAXone  (ROCEPHIN ) 1 g in sodium chloride  0.9 % 100 mL IVPB  Status:  Discontinued        1 g 200 mL/hr over 30 Minutes Intravenous Every 24 hours 05/27/24 0316  05/27/24 0740    05/28/24 0400  cefTRIAXone  (ROCEPHIN ) 2 g in sodium chloride  0.9 % 100 mL IVPB  Status:  Discontinued        2 g 200 mL/hr over 30 Minutes Intravenous Every 24 hours 05/27/24 0740 05/29/24 0820   05/27/24 0400  azithromycin  (ZITHROMAX ) 500 mg in sodium chloride  0.9 % 250 mL IVPB  Status:  Discontinued        500 mg 250 mL/hr over 60 Minutes Intravenous Every 24 hours 05/27/24 0316 05/29/24 0820   05/27/24 0245  cefTRIAXone  (ROCEPHIN ) 2 g in sodium chloride  0.9 % 100 mL IVPB        2 g 200 mL/hr over 30 Minutes Intravenous  Once 05/27/24 0236 05/27/24 0413   05/27/24 0245  azithromycin  (ZITHROMAX ) 500 mg in sodium chloride  0.9 % 250 mL IVPB  Status:  Discontinued        500 mg 250 mL/hr over 60 Minutes Intravenous  Once 05/27/24 0236 05/27/24 0320       Subjective: Patient seen and examined at bedside today.  Hemodynamically stable.  Lying in bed.  Just came from vital.  On room air.  Does not appear to be significant volume overloaded.  Speaking in full sentences.  Denies any cough but says he sees blood in the sputum every time he coughs out  Objective: Vitals:   05/30/24 1018 05/30/24 1023 05/30/24 1033 05/30/24 1038  BP: 121/83 115/81 117/76 118/84  Pulse: (!) 115 (!) 113 (!) 113 (!) 113  Resp: (!) 21 (!) 29 18 20   Temp:      TempSrc:      SpO2: 94% 94% 90% 93%  Weight:      Height:        Intake/Output Summary (Last 24 hours) at 05/30/2024 1121 Last data filed at 05/29/2024 2200 Gross per 24 hour  Intake 357 ml  Output --  Net 357 ml   Filed Weights   05/28/24 0312 05/29/24 0500 05/30/24 0242  Weight: 99.7 kg 96.8 kg 95.8 kg    Examination:  General exam: Overall comfortable, not in distress HEENT: PERRL Respiratory system: Bilateral basal crackles Cardiovascular system: S1 & S2 heard, RRR.  Gastrointestinal system: Abdomen is nondistended, soft and nontender. Central nervous system: Alert and oriented Extremities: No edema, no clubbing ,no cyanosis Skin: No  rashes, no ulcers,no icterus     Data Reviewed: I have personally reviewed following labs and imaging studies  CBC: Recent Labs  Lab 05/26/24 2154 05/27/24 0340 05/28/24 0241 05/29/24 0224 05/30/24 0300  WBC 9.9 9.7 9.1 8.4 8.0  NEUTROABS  --  7.4  --   --   --   HGB 12.0* 11.4* 11.0* 11.8* 12.7*  HCT 38.1* 36.4* 34.0* 36.1* 38.9*  MCV 95.7 96.3 94.4 92.1 93.3  PLT 180 181 201 240 330   Basic Metabolic Panel: Recent Labs  Lab 05/26/24 2154 05/27/24 0340 05/28/24 0241 05/29/24 0224 05/30/24 0300  NA 134* 132* 134* 133* 134*  K 4.6 4.5 4.5 3.9 3.8  CL 103 99 98 97* 100  CO2 21* 22 26 27 23   GLUCOSE 110* 99 89 119* 136*  BUN 15 14 24* 22* 25*  CREATININE 0.91 0.94 1.09 1.05 1.05  CALCIUM  8.4* 8.5* 8.4* 8.3* 8.1*  MG  --  1.8  --  2.0 2.1  PHOS  --  2.7  --  3.0 2.3*     Recent Results (from the past 240 hours)  Blood culture (routine x 2)  Status: None (Preliminary result)   Collection Time: 05/27/24  3:00 AM   Specimen: BLOOD  Result Value Ref Range Status   Specimen Description BLOOD LEFT ANTECUBITAL  Final   Special Requests   Final    BOTTLES DRAWN AEROBIC AND ANAEROBIC Blood Culture adequate volume   Culture   Final    NO GROWTH 3 DAYS Performed at [facility], [address] [place], [place] [address]    Report Status PENDING  Incomplete  Blood culture (routine x 2)     Status: None (Preliminary result)   Collection Time: 05/27/24  3:40 AM   Specimen: BLOOD  Result Value Ref Range Status   Specimen Description BLOOD RIGHT ANTECUBITAL  Final   Special Requests   Final    BOTTLES DRAWN AEROBIC AND ANAEROBIC Blood Culture adequate volume   Culture   Final    NO GROWTH 3 DAYS Performed at [facility], [address], [place], [place] [address]    Report Status PENDING  Incomplete  Resp panel by RT-PCR (RSV, Flu A&B, Covid) Anterior Nasal Swab     Status: None   Collection Time: 05/27/24  7:43 AM   Specimen: Anterior Nasal Swab   Result Value Ref Range Status   SARS Coronavirus 2 by RT PCR NEGATIVE NEGATIVE Final   Influenza A by PCR NEGATIVE NEGATIVE Final   Influenza B by PCR NEGATIVE NEGATIVE Final    Comment: (NOTE) The Xpert Xpress SARS-CoV-2/FLU/RSV plus assay is intended as an aid in the diagnosis of influenza from Nasopharyngeal swab specimens and should not be used as a sole basis for treatment. Nasal washings and aspirates are unacceptable for Xpert Xpress SARS-CoV-2/FLU/RSV testing.  Fact Sheet for Patients: bloggercourse.com  Fact Sheet for Healthcare Providers: seriousbroker.it  This test is not yet approved or cleared by the [place]  FDA and has been authorized for detection and/or diagnosis of SARS-CoV-2 by FDA under an Emergency Use Authorization (EUA). This EUA will remain in effect (meaning this test can be used) for the duration of the COVID-19 declaration under Section 564(b)(1) of the Act, 21 U.S.C. section 360bbb-3(b)(1), unless the authorization is terminated or revoked.     Resp Syncytial Virus by PCR NEGATIVE NEGATIVE Final    Comment: (NOTE) Fact Sheet for Patients: bloggercourse.com  Fact Sheet for Healthcare Providers: seriousbroker.it  This test is not yet approved or cleared by the [place]  FDA and has been authorized for detection and/or diagnosis of SARS-CoV-2 by FDA under an Emergency Use Authorization (EUA). This EUA will remain in effect (meaning this test can be used) for the duration of the COVID-19 declaration under Section 564(b)(1) of the Act, 21 U.S.C. section 360bbb-3(b)(1), unless the authorization is terminated or revoked.  Performed at [facility], [address], [place], [place] [address]      Radiology Studies: CARDIAC CATHETERIZATION Result Date: 05/30/2024 HEMODYNAMICS: RA:       5 mmHg (mean) RV:       32/1, 5 mmHg PA:        32/16 mmHg (22 mean) PCWP: 8 mmHg (mean)    Estimated Fick CO/CI   3.87L/min, 1.67L/min/m2 Thermodilution CO/CI   5.91L/min, 2.56L/min/m2    TPG  14  mmHg     PVR  2.36 Wood Units PAPi  3.2  IMPRESSION: Normal left and right heart filling pressures Normal cardiac output by thermodilution, more accurate than Fick especially with hypoxemia Mildly elevated PA pressure RECOMMENDATIONS: Hold further diuretics Ongoing  cough and shortness of breath more likely related to pulmonary disease Continue GDMT   ECHOCARDIOGRAM COMPLETE Result Date: 05/28/2024    ECHOCARDIOGRAM REPORT   Patient Name:   Anthony Singleton Date of Exam: 05/28/2024 Medical Rec #:  [id number]             Height:       78.0 in Accession #:    [id number]            Weight:       219.7 lb Date of Birth:  [birth year]             BSA:          2.349 m Patient Age:    53 years              BP:           134/93 mmHg Patient Gender: M                     HR:           130 bpm. Exam Location:  Inpatient Procedure: 2D Echo, Color Doppler, Cardiac Doppler and Intracardiac            Opacification Agent (Both Spectral and Color Flow Doppler were            utilized during procedure). Indications:    CHF I50.9  History:        Patient has prior history of Echocardiogram examinations, most                 recent 02/09/2024.  Sonographer:    Tinnie Gosling RDCS Referring Phys: [id number] LOGAN N LOCKWOOD IMPRESSIONS  1. Left ventricular ejection fraction, by estimation, is <20%. The left ventricle has severely decreased function. The left ventricle demonstrates global hypokinesis. The left ventricular internal cavity size was severely dilated. Left ventricular diastolic function could not be evaluated.  2. Right ventricular systolic function is severely reduced. The right ventricular size is moderately enlarged. Tricuspid regurgitation signal is inadequate for assessing PA pressure.  3. The mitral valve is normal in structure. Trivial mitral valve regurgitation. No  evidence of mitral stenosis.  4. The aortic valve is normal in structure. Aortic valve regurgitation is not visualized. No aortic stenosis is present.  5. The inferior vena cava is normal in size with <50% respiratory variability, suggesting right atrial pressure of 8 mmHg. FINDINGS  Left Ventricle: Left ventricular ejection fraction, by estimation, is <20%. The left ventricle has severely decreased function. The left ventricle demonstrates global hypokinesis. The left ventricular internal cavity size was severely dilated. There is no left ventricular hypertrophy. Left ventricular diastolic function could not be evaluated. Right Ventricle: The right ventricular size is moderately enlarged. No increase in right ventricular wall thickness. Right ventricular systolic function is severely reduced. Tricuspid regurgitation signal is inadequate for assessing PA pressure. Left Atrium: Left atrial size was normal in size. Right Atrium: Right atrial size was normal in size. Pericardium: There is no evidence of pericardial effusion. Mitral Valve: The mitral valve is normal in structure. Trivial mitral valve regurgitation. No evidence of mitral valve stenosis. Tricuspid Valve: The tricuspid valve is normal in structure. Tricuspid valve regurgitation is not demonstrated. No evidence of tricuspid stenosis. Aortic Valve: The aortic valve is normal in structure. Aortic valve regurgitation is not visualized. No aortic stenosis is present. Pulmonic Valve: The pulmonic valve was normal in structure. Pulmonic valve regurgitation is trivial. No evidence of pulmonic stenosis.  Aorta: The aortic root is normal in size and structure. Venous: The inferior vena cava is normal in size with less than 50% respiratory variability, suggesting right atrial pressure of 8 mmHg. IAS/Shunts: No atrial level shunt detected by color flow Doppler.  LEFT VENTRICLE PLAX 2D LVIDd:         6.60 cm      Diastology LVIDs:         5.40 cm      LV e' medial:   5.11 cm/s LV PW:         1.00 cm      LV e' lateral: 7.83 cm/s LV IVS:        0.90 cm LVOT diam:     2.38 cm LV SV:         44 LV SV Index:   19 LVOT Area:     4.45 cm  LV Volumes (MOD) LV vol d, MOD A2C: 319.0 ml LV vol d, MOD A4C: 242.0 ml LV vol s, MOD A2C: 239.0 ml LV vol s, MOD A4C: 192.0 ml LV SV MOD A2C:     80.0 ml LV SV MOD A4C:     242.0 ml LV SV MOD BP:      71.8 ml RIGHT VENTRICLE            IVC RV Basal diam:  4.89 cm    IVC diam: 1.43 cm RV S prime:     8.70 cm/s TAPSE (M-mode): 1.1 cm LEFT ATRIUM             Index        RIGHT ATRIUM           Index LA diam:        4.28 cm 1.82 cm/m   RA Area:     19.00 cm LA Vol (A2C):   60.1 ml 25.58 ml/m  RA Volume:   59.30 ml  25.24 ml/m LA Vol (A4C):   69.0 ml 29.37 ml/m LA Biplane Vol: 66.3 ml 28.22 ml/m  AORTIC VALVE LVOT Vmax:   75.80 cm/s LVOT Vmean:  56.400 cm/s LVOT VTI:    0.099 m  AORTA Ao Root diam: 3.24 cm Ao Asc diam:  3.18 cm  SHUNTS Systemic VTI:  0.10 m Systemic Diam: 2.38 cm Wilbert Bihari MD Electronically signed by Wilbert Bihari MD Signature Date/Time: 05/28/2024/4:22:16 PM    Final     Scheduled Meds:  amoxicillin -clavulanate  1 tablet Oral Q12H   dapagliflozin  propanediol  10 mg Oral Daily   digoxin   0.125 mg Oral Daily   enoxaparin  (LOVENOX ) injection  150 mg Subcutaneous Q24H   guaiFENesin   600 mg Oral BID   pantoprazole  (PROTONIX ) IV  40 mg Intravenous Q12H   sacubitril -valsartan   1 tablet Oral BID   sodium chloride  flush  3 mL Intravenous Q12H   spironolactone   25 mg Oral Daily   sucralfate   1 g Oral TID WC & HS   tranexamic acid   500 mg Nebulization Q8H   Warfarin - Pharmacist Dosing Inpatient   Does not apply q1600   Continuous Infusions:  potassium PHOSPHATE  IVPB (in mmol)       LOS: 3 days   Ivonne Mustache, MD [facility] P1/09/2024, 11:21 AM  "

## 2024-05-30 NOTE — Progress Notes (Signed)
 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  Also reported persistent fluid retention with associated lower extremity swelling and abdominal discomfort.  Workup on admission concerning for combined systolic and diastolic congestive heart failure exacerbation patient was admitted per hospitalist.  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  Since then states that he has not had any further hemoptysis.  Underwent right heart cath which showed a normal wedge pressure with good cardiac output Objective    Blood pressure (P) 120/73, pulse (!) (P) 112, temperature (P) 98.6 F (37 C), temperature source (P) Oral, resp. rate 20, height 6' 6 (1.981 m), weight 95.8 kg, SpO2 93%.         Intake/Output Summary (Last 24 hours) at 05/30/2024 1425 Last data filed at 05/29/2024 2200 Gross per 24 hour  Intake 357 ml  Output --  Net 357 ml   Filed Weights   05/28/24 0312 05/29/24 0500 05/30/24 0242  Weight: 99.7 kg 96.8 kg 95.8 kg    Examination: General : No acute distress HEENT: AC/NT Chest: Clear to auscultation bilaterally Heart: RRR, normal S1, S2, no M/R/G Abdomen: Soft, nontender, nondistended Neuro: Motor and sensation grossly intact Extremities: Warm, well-perfused Skin: no rashes or lesions  Resolved problem list   Assessment and Plan  New onset hemoptysis Hemoptysis initially suspected to be in the setting of pulmonary edema and anticoagulation however right heart cath with normal wedge pressure left suggestive of etiology.  Other potential include committee acquired pneumonia with airway irritation causing bleeding especially on anticoagulation.  Possibly improving with antibiotics and TXA nebs.  Will continue to monitor and decide if bronchoscopy is warranted.   Community-acquired pneumonia -Patient presented with dyspnea and cough with associated hemoptysis CTA negative for acute PE but did reveal new bibasilar pulmonary consolidation concerning for multifocal pneumonia or aspiration with a right impaction of the right lower lobe and volume loss in the left lower lobe.     Bilateral pleural effusions  -History of right parapneumonic effusion, s/p smallbore chest tube placed February 2025   History of pulmonary embolism anticoagulated with Coumadin  at baseline Supratherapeutic INR End-stage cardiomyopathy - Most recent echocardiogram September 2025 with EF less than 20% and grade 2 diastolic dysfunction P: Continue scheduled TXA nebs  Continue ceftriaxone  and azithromycin    Critical care time: NA    Zola Herter, MD [facility] Office: [phone]   See Amion for personal pager  PCCM on call pager [phone]  until  7pm. Please call Elink 7p-7a. [phone]          "

## 2024-05-30 NOTE — Progress Notes (Signed)
 PHARMACY - ANTICOAGULATION CONSULT NOTE  Pharmacy Consult for warfarin Indication: pulmonary embolus  Allergies[1]  Patient Measurements: Height: 6' 6 (198.1 cm) Weight: 95.8 kg (211 lb 3.2 oz) IBW/kg (Calculated) : 91.4 HEPARIN  DW (KG): 101.2  Vital Signs: Temp: 98.4 F (36.9 C) (01/05 0835) Temp Source: Oral (01/05 0835) BP: 118/84 (01/05 1038) Pulse Rate: 113 (01/05 1038)  Labs: Recent Labs    05/28/24 0241 05/29/24 0224 05/30/24 0300  HGB 11.0* 11.8* 12.7*  HCT 34.0* 36.1* 38.9*  PLT 201 240 330  LABPROT 19.6* 18.4* 21.8*  INR 1.6* 1.5* 1.8*  CREATININE 1.09 1.05 1.05    Estimated Creatinine Clearance: 107.6 mL/min (by C-G formula based on SCr of 1.05 mg/dL).   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   Past Week   calcium  elemental as carbonate (TUMS ULTRA 1000) 400 MG chewable tablet Chew 1,000 mg by mouth 2 (two) times daily as needed for heartburn (indigestion).   Past Week   dapagliflozin  propanediol (FARXIGA ) 10 MG TABS tablet Take 1 tablet (10 mg total) by mouth daily. 90 tablet 3 05/25/2024   digoxin  (LANOXIN ) 0.125 MG tablet Take 0.125 mg by mouth daily.   05/25/2024   diphenhydramine -acetaminophen  (TYLENOL  PM) 25-500 MG TABS tablet Take 1-2 tablets by mouth at bedtime as needed (sleep, pain).   Past Week   warfarin (COUMADIN ) 5 MG tablet Take 1 tablet (5 mg total) by mouth daily. (Patient taking differently: Take 2.5-5 mg by mouth See admin instructions. Take 1 tablet (5mg ) by mouth every Monday, Wednesday and Friday in the morning. Take 1/2  tablet (2.5mg ) by mouth on all other days.) 30 tablet 1 05/25/2024   gabapentin  (NEURONTIN ) 300 MG capsule Take 1 capsule (300 mg total) by mouth at bedtime. (Patient not taking: Reported on 05/27/2024) 30 capsule 0 Not Taking   spironolactone  (ALDACTONE ) 25 MG tablet Take 0.5 tablets (12.5 mg total) by mouth daily. (Patient not taking: Reported on 05/27/2024) 45 tablet 11 Not Taking    Assessment: 54yo M presenting with heart failure exacerbation. Patient on warfarin PTA (LD: 1/1 at 9am) for a PE (01/2024). 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 INR on admission was subtherapeutic at 1.5. Given recent PE and subtherapeutic INR, will bridge with lovenox . Pharmacy consulted to dose warfarin.   PTA regimen: 5mg  PO daily MWF, 2.5mg  PO all other days  05/31/2024: INR 1.8 today (subtherapeutic) but trending up after 3 doses of warfarin.  CBC stable (Hgb 12.9, pltc 330).  75% PO intake yesterday. Notable DDIs - Augmentin  (1/4 > 1/7); s/p Rocephin  x3d (1/2-1/4).  Goal of Therapy:  INR 2-3 Monitor platelets by anticoagulation protocol: Yes   Plan:  Warfarin 7.5 mg PO x1 Lovenox  150mg  subcutaneous (1.5mg /kg) daily until INR >2 Daily INR and CBC  Maurilio Fila, PharmD Clinical Pharmacist 05/30/2024  2:22 PM    [1] No Known Allergies

## 2024-05-30 NOTE — Progress Notes (Signed)
" °   05/30/24 0255  Provider Notification  Provider Name/Title Lee, MD  Date Provider Notified 05/30/24  Time Provider Notified [phone]  Method of Notification Page  Notification Reason Medical equipment refusal  Medical equipment refused/Pt. educated regarding refusal Telemetry / Continuous monitoring  Provider response No new orders  Date of Provider Response 05/30/24  Time of Provider Response 0255    "

## 2024-05-30 NOTE — Progress Notes (Signed)
" °   05/30/24 0726  Assess: MEWS Score  Temp 98.4 F (36.9 C)  BP (!) 89/73  MAP (mmHg) 79  Pulse Rate (!) 113  Resp 20  SpO2 96 %  O2 Device Room Air  Assess: MEWS Score  MEWS Temp 0  MEWS Systolic 1  MEWS Pulse 2  MEWS RR 0  MEWS LOC 0  MEWS Score 3  MEWS Score Color Yellow  Assess: if the MEWS score is Yellow or Red  Were vital signs accurate and taken at a resting state? Yes  Does the patient meet 2 or more of the SIRS criteria? No  MEWS guidelines implemented  No, previously yellow, continue vital signs every 4 hours  Provider Notification  Provider Name/Title Dr Jillian Coil  Date Provider Notified 05/30/24  Time Provider Notified [phone]  Method of Notification Page  Notification Reason Medical equipment refusal  Provider response See new orders  Date of Provider Response 05/30/24  Time of Provider Response [phone]  Notify: Rapid Response  Name of Rapid Response RN Notified n/a  Assess: SIRS CRITERIA  SIRS Temperature  0  SIRS Respirations  0  SIRS Pulse 1  SIRS WBC 0  SIRS Score Sum  1   MD Aware  "

## 2024-05-30 NOTE — Progress Notes (Signed)
 Mobility Specialist Progress Note:    05/30/24 1420  Mobility  Activity Turned to back - supine (Ankle Pumps, Leg Lifts, Heel Slides)  Level of Assistance Standby assist, set-up cues, supervision of patient - no hands on  Assistive Device None  Range of Motion/Exercises Active Assistive  Activity Response Tolerated fair;Other (Comment) (complaint about right sided pain)  Mobility Referral Yes  Mobility visit 1 Mobility  Mobility Specialist Start Time (ACUTE ONLY) 1420  Mobility Specialist Stop Time (ACUTE ONLY) 1430  Mobility Specialist Time Calculation (min) (ACUTE ONLY) 10 min   Received pt laying in bed w/ son in room reluctant but willing to do session. C/o of right sided pain and fatigue. Pt able to perform movements some w/ active assist. Left pt in bed w/ all needs met.   Venetia Keel Mobility Specialist Please Neurosurgeon or Rehab Office at [phone]
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
## 2024-05-30 NOTE — Interval H&P Note (Signed)
 History and Physical Interval Note:  05/30/2024 10:22 AM  Anthony Singleton  has presented today for surgery, with the diagnosis of Acute systolic heart failure.  The various methods of treatment have been discussed with the patient and family. After consideration of risks, benefits and other options for treatment, the patient has consented to  Procedures with comments: RIGHT HEART CATH (Right) - Will place IV in cath lab as a surgical intervention.  The patient's history has been reviewed, patient examined, no change in status, stable for surgery.  I have reviewed the patient's chart and labs.  Questions were answered to the patient's satisfaction.     Morene JINNY Brownie

## 2024-05-31 ENCOUNTER — Other Ambulatory Visit (HOSPITAL_COMMUNITY): Payer: Self-pay

## 2024-05-31 ENCOUNTER — Encounter (HOSPITAL_COMMUNITY): Payer: Self-pay | Admitting: Cardiology

## 2024-05-31 ENCOUNTER — Ambulatory Visit

## 2024-05-31 DIAGNOSIS — I5043 Acute on chronic combined systolic (congestive) and diastolic (congestive) heart failure: Secondary | ICD-10-CM | POA: Diagnosis not present

## 2024-05-31 LAB — CBC
HCT: 38.9 % — ABNORMAL LOW (ref 39.0–52.0)
Hemoglobin: 12.3 g/dL — ABNORMAL LOW (ref 13.0–17.0)
MCH: 29.7 pg (ref 26.0–34.0)
MCHC: 31.6 g/dL (ref 30.0–36.0)
MCV: 94 fL (ref 80.0–100.0)
Platelets: 362 K/uL (ref 150–400)
RBC: 4.14 MIL/uL — ABNORMAL LOW (ref 4.22–5.81)
RDW: 15.6 % — ABNORMAL HIGH (ref 11.5–15.5)
WBC: 6.5 K/uL (ref 4.0–10.5)
nRBC: 0 % (ref 0.0–0.2)

## 2024-05-31 LAB — COMPREHENSIVE METABOLIC PANEL WITH GFR
ALT: 134 U/L — ABNORMAL HIGH (ref 0–44)
AST: 27 U/L (ref 15–41)
Albumin: 2.8 g/dL — ABNORMAL LOW (ref 3.5–5.0)
Alkaline Phosphatase: 86 U/L (ref 38–126)
Anion gap: 10 (ref 5–15)
BUN: 22 mg/dL — ABNORMAL HIGH (ref 6–20)
CO2: 22 mmol/L (ref 22–32)
Calcium: 8.2 mg/dL — ABNORMAL LOW (ref 8.9–10.3)
Chloride: 102 mmol/L (ref 98–111)
Creatinine, Ser: 0.88 mg/dL (ref 0.61–1.24)
GFR, Estimated: >60 mL/min
Glucose, Bld: 128 mg/dL — ABNORMAL HIGH (ref 70–99)
Potassium: 4.3 mmol/L (ref 3.5–5.1)
Sodium: 135 mmol/L (ref 135–145)
Total Bilirubin: 0.5 mg/dL (ref 0.0–1.2)
Total Protein: 7 g/dL (ref 6.5–8.1)

## 2024-05-31 LAB — PROTIME-INR
INR: 2.1 — ABNORMAL HIGH (ref 0.8–1.2)
Prothrombin Time: 24.5 s — ABNORMAL HIGH (ref 11.4–15.2)

## 2024-05-31 MED ORDER — SACUBITRIL-VALSARTAN 49-51 MG PO TABS
1.0000 | ORAL_TABLET | Freq: Two times a day (BID) | ORAL | 0 refills | Status: AC
  Filled 2024-05-31: qty 60, 30d supply, fill #0

## 2024-05-31 MED ORDER — AMOXICILLIN-POT CLAVULANATE 875-125 MG PO TABS
1.0000 | ORAL_TABLET | Freq: Two times a day (BID) | ORAL | 0 refills | Status: AC
  Filled 2024-05-31: qty 4, 2d supply, fill #0

## 2024-05-31 MED ORDER — SPIRONOLACTONE 25 MG PO TABS
25.0000 mg | ORAL_TABLET | Freq: Every day | ORAL | 0 refills | Status: AC
  Filled 2024-05-31: qty 30, 30d supply, fill #0

## 2024-05-31 MED ORDER — FUROSEMIDE 20 MG PO TABS
20.0000 mg | ORAL_TABLET | Freq: Every day | ORAL | Status: DC
  Administered 2024-05-31: 20 mg via ORAL
  Filled 2024-05-31: qty 1

## 2024-05-31 MED ORDER — BENZONATATE 100 MG PO CAPS
200.0000 mg | ORAL_CAPSULE | Freq: Three times a day (TID) | ORAL | Status: DC
  Administered 2024-05-31 (×2): 200 mg via ORAL
  Filled 2024-05-31 (×2): qty 2

## 2024-05-31 MED ORDER — DM-GUAIFENESIN ER 30-600 MG PO TB12: 1.0000 | ORAL_TABLET | Freq: Two times a day (BID) | ORAL | Status: DC | PRN

## 2024-05-31 MED ORDER — SUCRALFATE 1 G PO TABS
1.0000 g | ORAL_TABLET | Freq: Four times a day (QID) | ORAL | 0 refills | Status: AC
  Filled 2024-05-31: qty 40, 10d supply, fill #0

## 2024-05-31 MED ORDER — WARFARIN SODIUM 2.5 MG PO TABS
2.5000 mg | ORAL_TABLET | ORAL | Status: AC
  Administered 2024-05-31: 2.5 mg via ORAL
  Filled 2024-05-31: qty 1

## 2024-05-31 MED ORDER — FUROSEMIDE 20 MG PO TABS
20.0000 mg | ORAL_TABLET | Freq: Every day | ORAL | 0 refills | Status: AC
  Filled 2024-05-31: qty 30, 30d supply, fill #0

## 2024-05-31 MED ORDER — PANTOPRAZOLE SODIUM 40 MG PO TBEC
40.0000 mg | DELAYED_RELEASE_TABLET | Freq: Two times a day (BID) | ORAL | 0 refills | Status: AC
  Filled 2024-05-31: qty 60, 30d supply, fill #0

## 2024-05-31 NOTE — Discharge Summary (Signed)
 Physician Discharge Summary  Anthony Singleton FMW:[id number] DOB: 01/30/1971 DOA: 05/26/2024  PCP: Sirivol, Mamatha, MD  Admit date: 05/26/2024 Discharge date: 05/31/2024  Admitted From: Home Disposition:  Home  Discharge Condition:Stable CODE STATUS:FULL Diet recommendation: Heart Healthy   Brief/Interim Summary: Patient is a 54 year old male with history of combined systolic/diastolic CHF, pulmonary embolism, anxiety who presented with chest pain, blood in the sputum.  Patient takes warfarin.  On prednisone he was in sinus tachycardia, elevated blood pressure.  Lab work showed elevated proBNP, creatinine 3.3, elevated LFTs.  CTA chest ruled out PE but showed bilateral bibasilar pulmonary consolidation likely multifocal infection or aspiration, right pleural effusion, mediastinal lymphadenopathy, distal esophageal wall thickening.  Started on antibiotics for community-acquired pneumonia.  Pulmonology, cardiology consulted.  Clinically improved now.  Medically stable for discharge home today.  Cardiology, pulmonology cleared for discharge  Following problems were addressed during the hospitalization:  Acute on chronic combined systolic/diastolic CHF: Presented with lower extremity edema, elevated JVP, elevated proBNP.  Chest imaging showed right-sided pleural effusion.  Last echo had shown EF of less than 20%, grade 2 diastolic dysfunction.  Started on IV Lasix , digoxin .  Repeat imaging showed EF of less than 20%, severely decreased left ventricular systolic function, global hypokinesis.  Advanced heart failure team following.  Underwent right heart cath with normal filling pressures.  He will follow-up with cardiology as an outpatient.  He appears euvolemic today.   Hemoptysis /community-acquired pneumonia: Reported coughing up blood.  CTA chest showed new bibasilar pulmonary consolidation concerning for multifocal infection/aspiration.  Currently on ceftriaxone , azithromycin .  Blood cultures no  growth to date.  Influenza/RSV/flu negative.  PCCM was consulted for hemoptysis.  Recommended to continue current antibiotics.  Hemoptysis has largely resolved.  Continue Augmentin  to finish the course   elevated troponin: Mild elevated with flat trend, EKG without significant ischemic changes.  Likely from demand ischemia   History of EZ:Ejupzwu were found to have concern for pulmonary embolism as well as RV thrombus during hospitalization back in September.  Currently on Coumadin    GERD/esophagitis: Protonix , Carafate    Normocytic anemia: Currently hemoglobin stable   History of Gilbert syndrome/elevated liver enzymes: Elevated liver enzymes.  Likely from hepatic congestion from CHF.  Continue to monitor an outpatient.  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  Not on any antiepileptic medications at this time.  Hypophosphatemia: Supplemented with phosphorus   Discharge Diagnoses:  Principal Problem:   Acute on chronic combined systolic and diastolic heart failure (HCC) Active Problems:   CAP (community acquired pneumonia)   SIRS (systemic inflammatory response syndrome) (HCC)   Elevated troponin   History of pulmonary embolism   Subtherapeutic international normalized ratio (INR)   GERD with esophagitis   Normocytic anemia   Hyponatremia   History of seizure   Elevated liver enzymes   Gilbert syndrome   NICM (nonischemic cardiomyopathy) (HCC)   Chronic pulmonary embolism [facility])    Discharge Instructions  Discharge Instructions     Discharge instructions   Complete by: As directed    1)Please take your medications as instructed 2)Follow up with your PCP in a week. 3)Follow up with cardiology as an outpatient.  You will be called for appointment   Increase activity slowly   Complete by: As directed    No wound care   Complete  by: As directed       Allergies as  of 05/31/2024   No Known Allergies      Medication List     STOP taking these medications    gabapentin  300 MG capsule Commonly known as: NEURONTIN        TAKE these medications    acetaminophen  500 MG tablet Commonly known as: TYLENOL  Take 1,000 mg by mouth 2 (two) times daily as needed for moderate pain (pain score 4-6), headache or fever.   amoxicillin -clavulanate 875-125 MG tablet Commonly known as: AUGMENTIN  Take 1 tablet by mouth every 12 (twelve) hours for 2 days.   dapagliflozin  propanediol 10 MG Tabs tablet Commonly known as: FARXIGA  Take 1 tablet (10 mg total) by mouth daily.   digoxin  0.125 MG tablet Commonly known as: LANOXIN  Take 0.125 mg by mouth daily.   diphenhydramine -acetaminophen  25-500 MG Tabs tablet Commonly known as: TYLENOL  PM Take 1-2 tablets by mouth at bedtime as needed (sleep, pain).   furosemide  20 MG tablet Commonly known as: LASIX  Take 1 tablet (20 mg total) by mouth daily. Start taking on: June 01, 2024   pantoprazole  40 MG tablet Commonly known as: Protonix  Take 1 tablet (40 mg total) by mouth 2 (two) times daily.   sacubitril -valsartan  49-51 MG Commonly known as: ENTRESTO  Take 1 tablet by mouth 2 (two) times daily.   spironolactone  25 MG tablet Commonly known as: ALDACTONE  Take 1 tablet (25 mg total) by mouth daily. Start taking on: June 01, 2024 What changed: how much to take   sucralfate  1 g tablet Commonly known as: Carafate  Take 1 tablet (1 g total) by mouth 4 (four) times daily.   Tums Ultra 1000 1000 MG chewable tablet Generic drug: calcium  elemental as carbonate Chew 1,000 mg by mouth 2 (two) times daily as needed for heartburn (indigestion).   warfarin 5 MG tablet Commonly known as: COUMADIN  Take as directed. If you are unsure how to take this medication, talk to your nurse or doctor. Original instructions: Take 1 tablet (5 mg total) by mouth daily. What changed:   how much to take when to take this additional instructions        Follow-up Information     [facility] Follow up on 06/09/2024.   Specialty: Cardiology Why: Follow up in the Advanced Heart Failure Clinic 1/15 at 1:30 Contact information: [address] [place]  [address] [phone]        Ivin Snuffer, MD. Schedule an appointment as soon as possible for a visit in 1 week(s).   Specialty: Family Medicine Contact information: [address] [place] [address] [phone]                Allergies[1]  Consultations: Cardiology, pulmonology   Procedures/Studies: CARDIAC CATHETERIZATION Result Date: 05/30/2024 HEMODYNAMICS: RA:       5 mmHg (mean) RV:       32/1, 5 mmHg PA:       32/16 mmHg (22 mean) PCWP: 8 mmHg (mean)    Estimated Fick CO/CI   3.87L/min, 1.67L/min/m2 Thermodilution CO/CI   5.91L/min, 2.56L/min/m2    TPG  14  mmHg     PVR  2.36 Wood Units PAPi  3.2  IMPRESSION: Normal left and right heart filling pressures Normal cardiac output by thermodilution, more accurate than Fick especially with hypoxemia Mildly elevated PA pressure RECOMMENDATIONS: Hold further diuretics Ongoing cough and shortness of breath more likely related to pulmonary disease Continue GDMT   ECHOCARDIOGRAM COMPLETE Result Date: 05/28/2024  ECHOCARDIOGRAM REPORT   Patient Name:   Anthony Singleton Date of Exam: 05/28/2024 Medical Rec #:  [id number]             Height:       78.0 in Accession #:    [id number]            Weight:       219.7 lb Date of Birth:  [birth year]             BSA:          2.349 m Patient Age:    53 years              BP:           134/93 mmHg Patient Gender: M                     HR:           130 bpm. Exam Location:  Inpatient Procedure: 2D Echo, Color Doppler, Cardiac Doppler and Intracardiac            Opacification Agent (Both Spectral and Color Flow Doppler were            utilized during  procedure). Indications:    CHF I50.9  History:        Patient has prior history of Echocardiogram examinations, most                 recent 02/09/2024.  Sonographer:    Tinnie Gosling RDCS Referring Phys: [id number] LOGAN N LOCKWOOD IMPRESSIONS  1. Left ventricular ejection fraction, by estimation, is <20%. The left ventricle has severely decreased function. The left ventricle demonstrates global hypokinesis. The left ventricular internal cavity size was severely dilated. Left ventricular diastolic function could not be evaluated.  2. Right ventricular systolic function is severely reduced. The right ventricular size is moderately enlarged. Tricuspid regurgitation signal is inadequate for assessing PA pressure.  3. The mitral valve is normal in structure. Trivial mitral valve regurgitation. No evidence of mitral stenosis.  4. The aortic valve is normal in structure. Aortic valve regurgitation is not visualized. No aortic stenosis is present.  5. The inferior vena cava is normal in size with <50% respiratory variability, suggesting right atrial pressure of 8 mmHg. FINDINGS  Left Ventricle: Left ventricular ejection fraction, by estimation, is <20%. The left ventricle has severely decreased function. The left ventricle demonstrates global hypokinesis. The left ventricular internal cavity size was severely dilated. There is no left ventricular hypertrophy. Left ventricular diastolic function could not be evaluated. Right Ventricle: The right ventricular size is moderately enlarged. No increase in right ventricular wall thickness. Right ventricular systolic function is severely reduced. Tricuspid regurgitation signal is inadequate for assessing PA pressure. Left Atrium: Left atrial size was normal in size. Right Atrium: Right atrial size was normal in size. Pericardium: There is no evidence of pericardial effusion. Mitral Valve: The mitral valve is normal in structure. Trivial mitral valve regurgitation. No evidence of  mitral valve stenosis. Tricuspid Valve: The tricuspid valve is normal in structure. Tricuspid valve regurgitation is not demonstrated. No evidence of tricuspid stenosis. Aortic Valve: The aortic valve is normal in structure. Aortic valve regurgitation is not visualized. No aortic stenosis is present. Pulmonic Valve: The pulmonic valve was normal in structure. Pulmonic valve regurgitation is trivial. No evidence of pulmonic stenosis. Aorta: The aortic root is normal in size and structure. Venous: The inferior vena cava is normal in size with less than  50% respiratory variability, suggesting right atrial pressure of 8 mmHg. IAS/Shunts: No atrial level shunt detected by color flow Doppler.  LEFT VENTRICLE PLAX 2D LVIDd:         6.60 cm      Diastology LVIDs:         5.40 cm      LV e' medial:  5.11 cm/s LV PW:         1.00 cm      LV e' lateral: 7.83 cm/s LV IVS:        0.90 cm LVOT diam:     2.38 cm LV SV:         44 LV SV Index:   19 LVOT Area:     4.45 cm  LV Volumes (MOD) LV vol d, MOD A2C: 319.0 ml LV vol d, MOD A4C: 242.0 ml LV vol s, MOD A2C: 239.0 ml LV vol s, MOD A4C: 192.0 ml LV SV MOD A2C:     80.0 ml LV SV MOD A4C:     242.0 ml LV SV MOD BP:      71.8 ml RIGHT VENTRICLE            IVC RV Basal diam:  4.89 cm    IVC diam: 1.43 cm RV S prime:     8.70 cm/s TAPSE (M-mode): 1.1 cm LEFT ATRIUM             Index        RIGHT ATRIUM           Index LA diam:        4.28 cm 1.82 cm/m   RA Area:     19.00 cm LA Vol (A2C):   60.1 ml 25.58 ml/m  RA Volume:   59.30 ml  25.24 ml/m LA Vol (A4C):   69.0 ml 29.37 ml/m LA Biplane Vol: 66.3 ml 28.22 ml/m  AORTIC VALVE LVOT Vmax:   75.80 cm/s LVOT Vmean:  56.400 cm/s LVOT VTI:    0.099 m  AORTA Ao Root diam: 3.24 cm Ao Asc diam:  3.18 cm  SHUNTS Systemic VTI:  0.10 m Systemic Diam: 2.38 cm Wilbert Bihari MD Electronically signed by Wilbert Bihari MD Signature Date/Time: 05/28/2024/4:22:16 PM    Final    CT Angio Chest PE W and/or Wo Contrast Result Date: 05/27/2024 EXAM:  CTA CHEST PE WITHOUT AND WITH CONTRAST CT ABDOMEN AND PELVIS WITHOUT AND WITH CONTRAST 05/27/2024 01:59:55 AM TECHNIQUE: CTA of the chest was performed without and with the administration of 75 mL of iohexol  (OMNIPAQUE ) 350 MG/ML injection. Multiplanar reformatted images are provided for review. MIP images are provided for review. CT of the abdomen and pelvis was performed without and with the administration of intravenous contrast. Automated exposure control, iterative reconstruction, and/or weight based adjustment of the mA/kV was utilized to reduce the radiation dose to as low as reasonably achievable. COMPARISON: 05/22/2024. CLINICAL HISTORY: Acute on localized abdominal pain, pulmonary embolism. High probability for pulmonary embolism. FINDINGS: CHEST: PULMONARY ARTERIES: Pulmonary arteries are adequately opacified for evaluation. No intraluminal filling defect to suggest pulmonary embolism. Main pulmonary artery is normal in caliber. MEDIASTINUM: Cardiomegaly. Mild coronary artery calcification. Shotty mediastinal adenopathy is new since prior examination, likely reactive or inflammatory in nature. No frankly pathologic adenopathy identified. There is no acute abnormality of the thoracic aorta. LUNGS AND PLEURA: There is new bibasilar pulmonary consolidation which may relate to multifocal infection or aspiration. Associated airway impaction within the right lower lobe. Volume loss  and some degree of rounded atelectasis within the left lower lobe. These appear partially located on the right and relatively complex on the left with marked pleural thickening. Right pleural effusion is slightly enlarged. Left pleural effusion appears stable since prior examination. No pneumothorax. SOFT TISSUES AND BONES: No acute bone or soft tissue abnormality. ABDOMEN AND PELVIS: LIVER: The liver is unremarkable. GALLBLADDER AND BILE DUCTS: Gallbladder is unremarkable. No biliary ductal dilatation. SPLEEN: Spleen demonstrates no  acute abnormality. PANCREAS: Pancreas demonstrates no acute abnormality. ADRENAL GLANDS: 16 mm nodule again noted within the left adrenal gland which is technically indeterminate but stable since remote prior examination of 05/09/2017 and most in keeping with a benign adrenal adenoma. No follow up imaging is recommended. KIDNEYS, URETERS AND BLADDER: 5 mm nonobstructing calculus noted within the upper pole of the left kidney. The kidneys are otherwise unremarkable. No hydronephrosis. No perinephric or periureteral stranding. Urinary bladder is unremarkable. GI AND BOWEL: There is marked thickening of the distal esophagus which is nonspecific but may relate to underlying esophagitis, such as reflux esophagitis. This could be better assessed with endoscopy if indicated. Small hiatal hernia. Appendexam. The stomach, small bowel, and large bowel are otherwise unremarkable. There is no bowel obstruction. No abnormal bowel wall thickening or distension. REPRODUCTIVE: Reproductive organs are unremarkable. PERITONEUM AND RETROPERITONEUM: No ascites or free air. LYMPH NODES: No lymphadenopathy. BONES AND SOFT TISSUES: Osseous structures are age appropriate. No acute bone abnormality. No lytic or blastic bone lesion. No focal soft tissue abnormality. IMPRESSION: 1. No pulmonary embolism. 2. No acute abnormality identified in the abdomen or pelvis. 3. New bibasilar pulmonary consolidation, which may reflect multifocal infection or aspiration, with associated airway impaction in the right lower lobe and volume loss/rounded atelectasis in the left lower lobe. 4. Right pleural effusion is slightly enlarged, no partial lobulated. 5. Stable left pleural effusion again demonstrating complexity with marked pleural thickening. This is nonspecific , but may represent the sequela of prior infection or inflammation, such as the pulmonary infarction and associated reactive pleural effusion noted on 02/09/2024. . 6. New shotty mediastinal  adenopathy, likely reactive/inflammatory. 7. Marked distal esophageal wall thickening, which may reflect esophagitis, with small hiatal hernia. Correlation with endoscopy may be helpful for further evaluation. 8. Cardiomegaly and mild coronary artery calcification. 9. Stable 16 mm left adrenal nodule since 2018, most consistent with a benign adrenal adenoma, with no follow-up imaging recommended. Electronically signed by: Dorethia Molt MD 05/27/2024 02:28 AM EST RP Workstation: [id number]   CT ABDOMEN PELVIS W CONTRAST Result Date: 05/27/2024 EXAM: CTA CHEST PE WITHOUT AND WITH CONTRAST CT ABDOMEN AND PELVIS WITHOUT AND WITH CONTRAST 05/27/2024 01:59:55 AM TECHNIQUE: CTA of the chest was performed without and with the administration of 75 mL of iohexol  (OMNIPAQUE ) 350 MG/ML injection. Multiplanar reformatted images are provided for review. MIP images are provided for review. CT of the abdomen and pelvis was performed without and with the administration of intravenous contrast. Automated exposure control, iterative reconstruction, and/or weight based adjustment of the mA/kV was utilized to reduce the radiation dose to as low as reasonably achievable. COMPARISON: 05/22/2024. CLINICAL HISTORY: Acute on localized abdominal pain, pulmonary embolism. High probability for pulmonary embolism. FINDINGS: CHEST: PULMONARY ARTERIES: Pulmonary arteries are adequately opacified for evaluation. No intraluminal filling defect to suggest pulmonary embolism. Main pulmonary artery is normal in caliber. MEDIASTINUM: Cardiomegaly. Mild coronary artery calcification. Shotty mediastinal adenopathy is new since prior examination, likely reactive or inflammatory in nature. No frankly pathologic adenopathy identified. There is no  acute abnormality of the thoracic aorta. LUNGS AND PLEURA: There is new bibasilar pulmonary consolidation which may relate to multifocal infection or aspiration. Associated airway impaction within the right lower  lobe. Volume loss and some degree of rounded atelectasis within the left lower lobe. These appear partially located on the right and relatively complex on the left with marked pleural thickening. Right pleural effusion is slightly enlarged. Left pleural effusion appears stable since prior examination. No pneumothorax. SOFT TISSUES AND BONES: No acute bone or soft tissue abnormality. ABDOMEN AND PELVIS: LIVER: The liver is unremarkable. GALLBLADDER AND BILE DUCTS: Gallbladder is unremarkable. No biliary ductal dilatation. SPLEEN: Spleen demonstrates no acute abnormality. PANCREAS: Pancreas demonstrates no acute abnormality. ADRENAL GLANDS: 16 mm nodule again noted within the left adrenal gland which is technically indeterminate but stable since remote prior examination of 05/09/2017 and most in keeping with a benign adrenal adenoma. No follow up imaging is recommended. KIDNEYS, URETERS AND BLADDER: 5 mm nonobstructing calculus noted within the upper pole of the left kidney. The kidneys are otherwise unremarkable. No hydronephrosis. No perinephric or periureteral stranding. Urinary bladder is unremarkable. GI AND BOWEL: There is marked thickening of the distal esophagus which is nonspecific but may relate to underlying esophagitis, such as reflux esophagitis. This could be better assessed with endoscopy if indicated. Small hiatal hernia. Appendexam. The stomach, small bowel, and large bowel are otherwise unremarkable. There is no bowel obstruction. No abnormal bowel wall thickening or distension. REPRODUCTIVE: Reproductive organs are unremarkable. PERITONEUM AND RETROPERITONEUM: No ascites or free air. LYMPH NODES: No lymphadenopathy. BONES AND SOFT TISSUES: Osseous structures are age appropriate. No acute bone abnormality. No lytic or blastic bone lesion. No focal soft tissue abnormality. IMPRESSION: 1. No pulmonary embolism. 2. No acute abnormality identified in the abdomen or pelvis. 3. New bibasilar pulmonary  consolidation, which may reflect multifocal infection or aspiration, with associated airway impaction in the right lower lobe and volume loss/rounded atelectasis in the left lower lobe. 4. Right pleural effusion is slightly enlarged, no partial lobulated. 5. Stable left pleural effusion again demonstrating complexity with marked pleural thickening. This is nonspecific , but may represent the sequela of prior infection or inflammation, such as the pulmonary infarction and associated reactive pleural effusion noted on 02/09/2024. . 6. New shotty mediastinal adenopathy, likely reactive/inflammatory. 7. Marked distal esophageal wall thickening, which may reflect esophagitis, with small hiatal hernia. Correlation with endoscopy may be helpful for further evaluation. 8. Cardiomegaly and mild coronary artery calcification. 9. Stable 16 mm left adrenal nodule since 2018, most consistent with a benign adrenal adenoma, with no follow-up imaging recommended. Electronically signed by: Dorethia Molt MD 05/27/2024 02:28 AM EST RP Workstation: [id number]   DG Chest 2 View Result Date: 05/26/2024 EXAM: 2 VIEW(S) XRAY OF THE CHEST 05/26/2024 10:24:12 PM COMPARISON: 02/09/2024 CLINICAL HISTORY: chest pain FINDINGS: LUNGS AND PLEURA: New small bilateral pleural effusions. Bibasilar infiltrates have increased on the left. There is central pulmonary vascular congestion. No pneumothorax. HEART AND MEDIASTINUM: Moderate cardiomegaly. The heart is enlarged. BONES AND SOFT TISSUES: No acute osseous abnormality. IMPRESSION: 1. New small bilateral pleural effusions. 2. Increased bibasilar infiltrates, left greater than right. 3. Moderate cardiomegaly with central pulmonary vascular congestion. Electronically signed by: Greig Pique MD 05/26/2024 10:55 PM EST RP Workstation: [id number]      Subjective: Patient seen and examined at bedside today.  Hemodynamically stable.  Comfortable today.  Has minimal cough.  Last episode of  hemoptysis was minimal and was yesterday.  Feels ready to go  home today.  Appears euvolemic  Discharge Exam: Vitals:   05/31/24 0509 05/31/24 0751  BP:    Pulse: 100   Resp: 18 17  Temp:  98.4 F (36.9 C)  SpO2: 95%    Vitals:   05/31/24 0406 05/31/24 0500 05/31/24 0509 05/31/24 0751  BP: 119/82     Pulse: (!) 109  100   Resp: 18  18 17   Temp: 98.4 F (36.9 C)   98.4 F (36.9 C)  TempSrc: Oral   Oral  SpO2: 96%  95%   Weight:  96.9 kg    Height:        General: Pt is alert, awake, not in acute distress Cardiovascular: RRR, S1/S2 +, no rubs, no gallops Respiratory: CTA bilaterally, no wheezing, no rhonchi Abdominal: Soft, NT, ND, bowel sounds + Extremities: no edema, no cyanosis    The results of significant diagnostics from this hospitalization (including imaging, microbiology, ancillary and laboratory) are listed below for reference.     Microbiology: Recent Results (from the past 240 hours)  Blood culture (routine x 2)     Status: None (Preliminary result)   Collection Time: 05/27/24  3:00 AM   Specimen: BLOOD  Result Value Ref Range Status   Specimen Description BLOOD LEFT ANTECUBITAL  Final   Special Requests   Final    BOTTLES DRAWN AEROBIC AND ANAEROBIC Blood Culture adequate volume   Culture   Final    NO GROWTH 4 DAYS Performed at [facility], [address], [place], [place] [address]    Report Status PENDING  Incomplete  Blood culture (routine x 2)     Status: None (Preliminary result)   Collection Time: 05/27/24  3:40 AM   Specimen: BLOOD  Result Value Ref Range Status   Specimen Description BLOOD RIGHT ANTECUBITAL  Final   Special Requests   Final    BOTTLES DRAWN AEROBIC AND ANAEROBIC Blood Culture adequate volume   Culture   Final    NO GROWTH 4 DAYS Performed at [facility], [address], [place], [place] [address]    Report Status PENDING  Incomplete  Resp panel by RT-PCR (RSV, Flu A&B, Covid) Anterior Nasal Swab      Status: None   Collection Time: 05/27/24  7:43 AM   Specimen: Anterior Nasal Swab  Result Value Ref Range Status   SARS Coronavirus 2 by RT PCR NEGATIVE NEGATIVE Final   Influenza A by PCR NEGATIVE NEGATIVE Final   Influenza B by PCR NEGATIVE NEGATIVE Final    Comment: (NOTE) The Xpert Xpress SARS-CoV-2/FLU/RSV plus assay is intended as an aid in the diagnosis of influenza from Nasopharyngeal swab specimens and should not be used as a sole basis for treatment. Nasal washings and aspirates are unacceptable for Xpert Xpress SARS-CoV-2/FLU/RSV testing.  Fact Sheet for Patients: bloggercourse.com  Fact Sheet for Healthcare Providers: seriousbroker.it  This test is not yet approved or cleared by the [place]  FDA and has been authorized for detection and/or diagnosis of SARS-CoV-2 by FDA under an Emergency Use Authorization (EUA). This EUA will remain in effect (meaning this test can be used) for the duration of the COVID-19 declaration under Section 564(b)(1) of the Act, 21 U.S.C. section 360bbb-3(b)(1), unless the authorization is terminated or revoked.     Resp Syncytial Virus by PCR NEGATIVE NEGATIVE Final    Comment: (NOTE) Fact Sheet for Patients: bloggercourse.com  Fact Sheet for Healthcare Providers: seriousbroker.it  This test is not yet approved  or cleared by the [place]  FDA and has been authorized for detection and/or diagnosis of SARS-CoV-2 by FDA under an Emergency Use Authorization (EUA). This EUA will remain in effect (meaning this test can be used) for the duration of the COVID-19 declaration under Section 564(b)(1) of the Act, 21 U.S.C. section 360bbb-3(b)(1), unless the authorization is terminated or revoked.  Performed at [facility], [address] [place], [place] [address]      Labs: BNP (last 3 results) Recent Labs    02/09/24 0935  02/24/24 1010 03/28/24 0927  BNP 1,666.8* 603.3* 282.6*   Basic Metabolic Panel: Recent Labs  Lab 05/27/24 0340 05/28/24 0241 05/29/24 0224 05/30/24 0300 05/30/24 1035 05/31/24 0301  NA 132* 134* 133* 134* 138  139 135  K 4.5 4.5 3.9 3.8 4.0  3.9 4.3  CL 99 98 97* 100  --  102  CO2 22 26 27 23   --  22  GLUCOSE 99 89 119* 136*  --  128*  BUN 14 24* 22* 25*  --  22*  CREATININE 0.94 1.09 1.05 1.05  --  0.88  CALCIUM  8.5* 8.4* 8.3* 8.1*  --  8.2*  MG 1.8  --  2.0 2.1  --   --   PHOS 2.7  --  3.0 2.3*  --   --    Liver Function Tests: Recent Labs  Lab 05/27/24 0139 05/28/24 0241 05/29/24 0224 05/31/24 0301  AST 152* 68* 39 27  ALT 563* 377* 270* 134*  ALKPHOS 99 95 104 86  BILITOT 1.8* 1.0 0.8 0.5  PROT 7.1 6.7 7.0 7.0  ALBUMIN 3.3* 3.0* 3.0* 2.8*   Recent Labs  Lab 05/27/24 0139  LIPASE 24   No results for input(s): AMMONIA in the last 168 hours. CBC: Recent Labs  Lab 05/27/24 0340 05/28/24 0241 05/29/24 0224 05/30/24 0300 05/30/24 1035 05/31/24 0301  WBC 9.7 9.1 8.4 8.0  --  6.5  NEUTROABS 7.4  --   --   --   --   --   HGB 11.4* 11.0* 11.8* 12.7* 12.9*  12.9* 12.3*  HCT 36.4* 34.0* 36.1* 38.9* 38.0*  38.0* 38.9*  MCV 96.3 94.4 92.1 93.3  --  94.0  PLT 181 201 240 330  --  362   Cardiac Enzymes: No results for input(s): CKTOTAL, CKMB, CKMBINDEX, TROPONINI in the last 168 hours. BNP: Invalid input(s): POCBNP CBG: No results for input(s): GLUCAP in the last 168 hours. D-Dimer Recent Labs    05/29/24 1915  DDIMER 10.35*   Hgb A1c No results for input(s): HGBA1C in the last 72 hours. Lipid Profile No results for input(s): CHOL, HDL, LDLCALC, TRIG, CHOLHDL, LDLDIRECT in the last 72 hours. Thyroid function studies No results for input(s): TSH, T4TOTAL, T3FREE, THYROIDAB in the last 72 hours.  Invalid input(s): FREET3 Anemia work up No results for input(s): VITAMINB12, FOLATE, FERRITIN, TIBC,  IRON, RETICCTPCT in the last 72 hours. Urinalysis    Component Value Date/Time   COLORURINE COLORLESS (A) 07/08/2023 1133   APPEARANCEUR CLEAR 07/08/2023 1133   LABSPEC 1.004 (L) 07/08/2023 1133   PHURINE 7.0 07/08/2023 1133   GLUCOSEU NEGATIVE 07/08/2023 1133   HGBUR NEGATIVE 07/08/2023 1133   BILIRUBINUR NEGATIVE 07/08/2023 1133   KETONESUR NEGATIVE 07/08/2023 1133   PROTEINUR NEGATIVE 07/08/2023 1133   NITRITE NEGATIVE 07/08/2023 1133   LEUKOCYTESUR NEGATIVE 07/08/2023 1133   Sepsis Labs Recent Labs  Lab 05/28/24 0241 05/29/24 0224 05/30/24 0300 05/31/24 0301  WBC 9.1 8.4  8.0 6.5   Microbiology Recent Results (from the past 240 hours)  Blood culture (routine x 2)     Status: None (Preliminary result)   Collection Time: 05/27/24  3:00 AM   Specimen: BLOOD  Result Value Ref Range Status   Specimen Description BLOOD LEFT ANTECUBITAL  Final   Special Requests   Final    BOTTLES DRAWN AEROBIC AND ANAEROBIC Blood Culture adequate volume   Culture   Final    NO GROWTH 4 DAYS Performed at [facility], [address], [place], [place] [address]    Report Status PENDING  Incomplete  Blood culture (routine x 2)     Status: None (Preliminary result)   Collection Time: 05/27/24  3:40 AM   Specimen: BLOOD  Result Value Ref Range Status   Specimen Description BLOOD RIGHT ANTECUBITAL  Final   Special Requests   Final    BOTTLES DRAWN AEROBIC AND ANAEROBIC Blood Culture adequate volume   Culture   Final    NO GROWTH 4 DAYS Performed at [facility], [address] [place], [place] [address]    Report Status PENDING  Incomplete  Resp panel by RT-PCR (RSV, Flu A&B, Covid) Anterior Nasal Swab     Status: None   Collection Time: 05/27/24  7:43 AM   Specimen: Anterior Nasal Swab  Result Value Ref Range Status   SARS Coronavirus 2 by RT PCR NEGATIVE NEGATIVE Final   Influenza A by PCR NEGATIVE NEGATIVE Final   Influenza B by PCR NEGATIVE NEGATIVE Final     Comment: (NOTE) The Xpert Xpress SARS-CoV-2/FLU/RSV plus assay is intended as an aid in the diagnosis of influenza from Nasopharyngeal swab specimens and should not be used as a sole basis for treatment. Nasal washings and aspirates are unacceptable for Xpert Xpress SARS-CoV-2/FLU/RSV testing.  Fact Sheet for Patients: bloggercourse.com  Fact Sheet for Healthcare Providers: seriousbroker.it  This test is not yet approved or cleared by the [place]  FDA and has been authorized for detection and/or diagnosis of SARS-CoV-2 by FDA under an Emergency Use Authorization (EUA). This EUA will remain in effect (meaning this test can be used) for the duration of the COVID-19 declaration under Section 564(b)(1) of the Act, 21 U.S.C. section 360bbb-3(b)(1), unless the authorization is terminated or revoked.     Resp Syncytial Virus by PCR NEGATIVE NEGATIVE Final    Comment: (NOTE) Fact Sheet for Patients: bloggercourse.com  Fact Sheet for Healthcare Providers: seriousbroker.it  This test is not yet approved or cleared by the [place]  FDA and has been authorized for detection and/or diagnosis of SARS-CoV-2 by FDA under an Emergency Use Authorization (EUA). This EUA will remain in effect (meaning this test can be used) for the duration of the COVID-19 declaration under Section 564(b)(1) of the Act, 21 U.S.C. section 360bbb-3(b)(1), unless the authorization is terminated or revoked.  Performed at [facility], [address], [place], [place] [address]     Please note: You were cared for by a hospitalist during your hospital stay. Once you are discharged, your primary care physician will handle any further medical issues. Please note that NO REFILLS for any discharge medications will be authorized once you are discharged, as it is imperative that you return to your primary care  physician (or establish a relationship with a primary care physician if you do not have one) for your post hospital discharge needs so that they can reassess your need for medications and monitor your  lab values.    Time coordinating discharge: 40 minutes  SIGNED:   Ivonne Mustache, MD  [facility] 05/31/2024, 12:49 PM Pager [phone]  If 7PM-7AM, please contact night-coverage [email or web address] [facility] Physician Discharge Summary  Anthony Singleton FMW:[id number] DOB: [birth year] DOA: 05/26/2024  PCP: Sirivol, Mamatha, MD  Admit date: 05/26/2024 Discharge date: 05/31/2024  Admitted From: Home Disposition:  Home  Discharge Condition:Stable CODE STATUS:FULL, DNR, Comfort Care Diet recommendation: Heart Healthy / Carb Modified / Regular / Dysphagia   Brief/Interim Summary:   Following problems were addressed during the hospitalization:   Discharge Diagnoses:  Principal Problem:   Acute on chronic combined systolic and diastolic heart failure (HCC) Active Problems:   CAP (community acquired pneumonia)   SIRS (systemic inflammatory response syndrome) (HCC)   Elevated troponin   History of pulmonary embolism   Subtherapeutic international normalized ratio (INR)   GERD with esophagitis   Normocytic anemia   Hyponatremia   History of seizure   Elevated liver enzymes   Gilbert syndrome   NICM (nonischemic cardiomyopathy) (HCC)   Chronic pulmonary embolism [facility])    Discharge Instructions  Discharge Instructions     Discharge instructions   Complete by: As directed    1)Please take your medications as instructed 2)Follow up with your PCP in a week. 3)Follow up with cardiology as an outpatient.  You will be called for appointment   Increase activity slowly   Complete by: As directed    No wound care   Complete by: As directed       Allergies as of 05/31/2024   No Known Allergies      Medication List     STOP taking these medications     gabapentin  300 MG capsule Commonly known as: NEURONTIN        TAKE these medications    acetaminophen  500 MG tablet Commonly known as: TYLENOL  Take 1,000 mg by mouth 2 (two) times daily as needed for moderate pain (pain score 4-6), headache or fever.   amoxicillin -clavulanate 875-125 MG tablet Commonly known as: AUGMENTIN  Take 1 tablet by mouth every 12 (twelve) hours for 2 days.   dapagliflozin  propanediol 10 MG Tabs tablet Commonly known as: FARXIGA  Take 1 tablet (10 mg total) by mouth daily.   digoxin  0.125 MG tablet Commonly known as: LANOXIN  Take 0.125 mg by mouth daily.   diphenhydramine -acetaminophen  25-500 MG Tabs tablet Commonly known as: TYLENOL  PM Take 1-2 tablets by mouth at bedtime as needed (sleep, pain).   furosemide  20 MG tablet Commonly known as: LASIX  Take 1 tablet (20 mg total) by mouth daily. Start taking on: June 01, 2024   pantoprazole  40 MG tablet Commonly known as: Protonix  Take 1 tablet (40 mg total) by mouth 2 (two) times daily.   sacubitril -valsartan  49-51 MG Commonly known as: ENTRESTO  Take 1 tablet by mouth 2 (two) times daily.   spironolactone  25 MG tablet Commonly known as: ALDACTONE  Take 1 tablet (25 mg total) by mouth daily. Start taking on: June 01, 2024 What changed: how much to take   sucralfate  1 g tablet Commonly known as: Carafate  Take 1 tablet (1 g total) by mouth 4 (four) times daily.   Tums Ultra 1000 1000 MG chewable tablet Generic drug: calcium  elemental as carbonate Chew 1,000 mg by mouth 2 (two) times daily as needed for heartburn (indigestion).   warfarin 5 MG tablet Commonly known as: COUMADIN  Take as directed. If you are unsure how to take this medication, talk to your nurse or doctor.  Original instructions: Take 1 tablet (5 mg total) by mouth daily. What changed:  how much to take when to take this additional instructions        Follow-up Information     [facility]  Specialty Clinics Follow up on 06/09/2024.   Specialty: Cardiology Why: Follow up in the Advanced Heart Failure Clinic 1/15 at 1:30 Contact information: [address] [place]  [address] [phone]        Ivin Snuffer, MD. Schedule an appointment as soon as possible for a visit in 1 week(s).   Specialty: Family Medicine Contact information: [address] [place] [address] [place] [address] [phone]                Allergies[2]  Consultations:    Procedures/Studies: CARDIAC CATHETERIZATION Result Date: 05/30/2024 HEMODYNAMICS: RA:       5 mmHg (mean) RV:       32/1, 5 mmHg PA:       32/16 mmHg (22 mean) PCWP: 8 mmHg (mean)    Estimated Fick CO/CI   3.87L/min, 1.67L/min/m2 Thermodilution CO/CI   5.91L/min, 2.56L/min/m2    TPG  14  mmHg     PVR  2.36 Wood Units PAPi  3.2  IMPRESSION: Normal left and right heart filling pressures Normal cardiac output by thermodilution, more accurate than Fick especially with hypoxemia Mildly elevated PA pressure RECOMMENDATIONS: Hold further diuretics Ongoing cough and shortness of breath more likely related to pulmonary disease Continue GDMT   ECHOCARDIOGRAM COMPLETE Result Date: 05/28/2024    ECHOCARDIOGRAM REPORT   Patient Name:   Anthony Singleton Date of Exam: 05/28/2024 Medical Rec #:  [id number]             Height:       78.0 in Accession #:    [id number]            Weight:       219.7 lb Date of Birth:  September 01, 1970             BSA:          2.349 m Patient Age:    53 years              BP:           134/93 mmHg Patient Gender: M                     HR:           130 bpm. Exam Location:  Inpatient Procedure: 2D Echo, Color Doppler, Cardiac Doppler and Intracardiac            Opacification Agent (Both Spectral and Color Flow Doppler were            utilized during procedure). Indications:    CHF I50.9  History:        Patient has prior history of Echocardiogram examinations, most                 recent 02/09/2024.   Sonographer:    Tinnie Gosling RDCS Referring Phys: [id number] LOGAN N LOCKWOOD IMPRESSIONS  1. Left ventricular ejection fraction, by estimation, is <20%. The left ventricle has severely decreased function. The left ventricle demonstrates global hypokinesis. The left ventricular internal cavity size was severely dilated. Left ventricular diastolic function could not be evaluated.  2. Right ventricular systolic function is severely reduced. The right ventricular size is moderately enlarged. Tricuspid regurgitation signal is inadequate for  assessing PA pressure.  3. The mitral valve is normal in structure. Trivial mitral valve regurgitation. No evidence of mitral stenosis.  4. The aortic valve is normal in structure. Aortic valve regurgitation is not visualized. No aortic stenosis is present.  5. The inferior vena cava is normal in size with <50% respiratory variability, suggesting right atrial pressure of 8 mmHg. FINDINGS  Left Ventricle: Left ventricular ejection fraction, by estimation, is <20%. The left ventricle has severely decreased function. The left ventricle demonstrates global hypokinesis. The left ventricular internal cavity size was severely dilated. There is no left ventricular hypertrophy. Left ventricular diastolic function could not be evaluated. Right Ventricle: The right ventricular size is moderately enlarged. No increase in right ventricular wall thickness. Right ventricular systolic function is severely reduced. Tricuspid regurgitation signal is inadequate for assessing PA pressure. Left Atrium: Left atrial size was normal in size. Right Atrium: Right atrial size was normal in size. Pericardium: There is no evidence of pericardial effusion. Mitral Valve: The mitral valve is normal in structure. Trivial mitral valve regurgitation. No evidence of mitral valve stenosis. Tricuspid Valve: The tricuspid valve is normal in structure. Tricuspid valve regurgitation is not demonstrated. No evidence of  tricuspid stenosis. Aortic Valve: The aortic valve is normal in structure. Aortic valve regurgitation is not visualized. No aortic stenosis is present. Pulmonic Valve: The pulmonic valve was normal in structure. Pulmonic valve regurgitation is trivial. No evidence of pulmonic stenosis. Aorta: The aortic root is normal in size and structure. Venous: The inferior vena cava is normal in size with less than 50% respiratory variability, suggesting right atrial pressure of 8 mmHg. IAS/Shunts: No atrial level shunt detected by color flow Doppler.  LEFT VENTRICLE PLAX 2D LVIDd:         6.60 cm      Diastology LVIDs:         5.40 cm      LV e' medial:  5.11 cm/s LV PW:         1.00 cm      LV e' lateral: 7.83 cm/s LV IVS:        0.90 cm LVOT diam:     2.38 cm LV SV:         44 LV SV Index:   19 LVOT Area:     4.45 cm  LV Volumes (MOD) LV vol d, MOD A2C: 319.0 ml LV vol d, MOD A4C: 242.0 ml LV vol s, MOD A2C: 239.0 ml LV vol s, MOD A4C: 192.0 ml LV SV MOD A2C:     80.0 ml LV SV MOD A4C:     242.0 ml LV SV MOD BP:      71.8 ml RIGHT VENTRICLE            IVC RV Basal diam:  4.89 cm    IVC diam: 1.43 cm RV S prime:     8.70 cm/s TAPSE (M-mode): 1.1 cm LEFT ATRIUM             Index        RIGHT ATRIUM           Index LA diam:        4.28 cm 1.82 cm/m   RA Area:     19.00 cm LA Vol (A2C):   60.1 ml 25.58 ml/m  RA Volume:   59.30 ml  25.24 ml/m LA Vol (A4C):   69.0 ml 29.37 ml/m LA Biplane Vol: 66.3 ml 28.22 ml/m  AORTIC VALVE LVOT Vmax:  75.80 cm/s LVOT Vmean:  56.400 cm/s LVOT VTI:    0.099 m  AORTA Ao Root diam: 3.24 cm Ao Asc diam:  3.18 cm  SHUNTS Systemic VTI:  0.10 m Systemic Diam: 2.38 cm Wilbert Bihari MD Electronically signed by Wilbert Bihari MD Signature Date/Time: 05/28/2024/4:22:16 PM    Final    CT Angio Chest PE W and/or Wo Contrast Result Date: 05/27/2024 EXAM: CTA CHEST PE WITHOUT AND WITH CONTRAST CT ABDOMEN AND PELVIS WITHOUT AND WITH CONTRAST 05/27/2024 01:59:55 AM TECHNIQUE: CTA of the chest was performed  without and with the administration of 75 mL of iohexol  (OMNIPAQUE ) 350 MG/ML injection. Multiplanar reformatted images are provided for review. MIP images are provided for review. CT of the abdomen and pelvis was performed without and with the administration of intravenous contrast. Automated exposure control, iterative reconstruction, and/or weight based adjustment of the mA/kV was utilized to reduce the radiation dose to as low as reasonably achievable. COMPARISON: 05/22/2024. CLINICAL HISTORY: Acute on localized abdominal pain, pulmonary embolism. High probability for pulmonary embolism. FINDINGS: CHEST: PULMONARY ARTERIES: Pulmonary arteries are adequately opacified for evaluation. No intraluminal filling defect to suggest pulmonary embolism. Main pulmonary artery is normal in caliber. MEDIASTINUM: Cardiomegaly. Mild coronary artery calcification. Shotty mediastinal adenopathy is new since prior examination, likely reactive or inflammatory in nature. No frankly pathologic adenopathy identified. There is no acute abnormality of the thoracic aorta. LUNGS AND PLEURA: There is new bibasilar pulmonary consolidation which may relate to multifocal infection or aspiration. Associated airway impaction within the right lower lobe. Volume loss and some degree of rounded atelectasis within the left lower lobe. These appear partially located on the right and relatively complex on the left with marked pleural thickening. Right pleural effusion is slightly enlarged. Left pleural effusion appears stable since prior examination. No pneumothorax. SOFT TISSUES AND BONES: No acute bone or soft tissue abnormality. ABDOMEN AND PELVIS: LIVER: The liver is unremarkable. GALLBLADDER AND BILE DUCTS: Gallbladder is unremarkable. No biliary ductal dilatation. SPLEEN: Spleen demonstrates no acute abnormality. PANCREAS: Pancreas demonstrates no acute abnormality. ADRENAL GLANDS: 16 mm nodule again noted within the left adrenal gland which  is technically indeterminate but stable since remote prior examination of 05/09/2017 and most in keeping with a benign adrenal adenoma. No follow up imaging is recommended. KIDNEYS, URETERS AND BLADDER: 5 mm nonobstructing calculus noted within the upper pole of the left kidney. The kidneys are otherwise unremarkable. No hydronephrosis. No perinephric or periureteral stranding. Urinary bladder is unremarkable. GI AND BOWEL: There is marked thickening of the distal esophagus which is nonspecific but may relate to underlying esophagitis, such as reflux esophagitis. This could be better assessed with endoscopy if indicated. Small hiatal hernia. Appendexam. The stomach, small bowel, and large bowel are otherwise unremarkable. There is no bowel obstruction. No abnormal bowel wall thickening or distension. REPRODUCTIVE: Reproductive organs are unremarkable. PERITONEUM AND RETROPERITONEUM: No ascites or free air. LYMPH NODES: No lymphadenopathy. BONES AND SOFT TISSUES: Osseous structures are age appropriate. No acute bone abnormality. No lytic or blastic bone lesion. No focal soft tissue abnormality. IMPRESSION: 1. No pulmonary embolism. 2. No acute abnormality identified in the abdomen or pelvis. 3. New bibasilar pulmonary consolidation, which may reflect multifocal infection or aspiration, with associated airway impaction in the right lower lobe and volume loss/rounded atelectasis in the left lower lobe. 4. Right pleural effusion is slightly enlarged, no partial lobulated. 5. Stable left pleural effusion again demonstrating complexity with marked pleural thickening. This is nonspecific , but may represent  the sequela of prior infection or inflammation, such as the pulmonary infarction and associated reactive pleural effusion noted on 02/09/2024. . 6. New shotty mediastinal adenopathy, likely reactive/inflammatory. 7. Marked distal esophageal wall thickening, which may reflect esophagitis, with small hiatal hernia.  Correlation with endoscopy may be helpful for further evaluation. 8. Cardiomegaly and mild coronary artery calcification. 9. Stable 16 mm left adrenal nodule since 2018, most consistent with a benign adrenal adenoma, with no follow-up imaging recommended. Electronically signed by: Dorethia Molt MD 05/27/2024 02:28 AM EST RP Workstation: [id number]   CT ABDOMEN PELVIS W CONTRAST Result Date: 05/27/2024 EXAM: CTA CHEST PE WITHOUT AND WITH CONTRAST CT ABDOMEN AND PELVIS WITHOUT AND WITH CONTRAST 05/27/2024 01:59:55 AM TECHNIQUE: CTA of the chest was performed without and with the administration of 75 mL of iohexol  (OMNIPAQUE ) 350 MG/ML injection. Multiplanar reformatted images are provided for review. MIP images are provided for review. CT of the abdomen and pelvis was performed without and with the administration of intravenous contrast. Automated exposure control, iterative reconstruction, and/or weight based adjustment of the mA/kV was utilized to reduce the radiation dose to as low as reasonably achievable. COMPARISON: 05/22/2024. CLINICAL HISTORY: Acute on localized abdominal pain, pulmonary embolism. High probability for pulmonary embolism. FINDINGS: CHEST: PULMONARY ARTERIES: Pulmonary arteries are adequately opacified for evaluation. No intraluminal filling defect to suggest pulmonary embolism. Main pulmonary artery is normal in caliber. MEDIASTINUM: Cardiomegaly. Mild coronary artery calcification. Shotty mediastinal adenopathy is new since prior examination, likely reactive or inflammatory in nature. No frankly pathologic adenopathy identified. There is no acute abnormality of the thoracic aorta. LUNGS AND PLEURA: There is new bibasilar pulmonary consolidation which may relate to multifocal infection or aspiration. Associated airway impaction within the right lower lobe. Volume loss and some degree of rounded atelectasis within the left lower lobe. These appear partially located on the right and relatively  complex on the left with marked pleural thickening. Right pleural effusion is slightly enlarged. Left pleural effusion appears stable since prior examination. No pneumothorax. SOFT TISSUES AND BONES: No acute bone or soft tissue abnormality. ABDOMEN AND PELVIS: LIVER: The liver is unremarkable. GALLBLADDER AND BILE DUCTS: Gallbladder is unremarkable. No biliary ductal dilatation. SPLEEN: Spleen demonstrates no acute abnormality. PANCREAS: Pancreas demonstrates no acute abnormality. ADRENAL GLANDS: 16 mm nodule again noted within the left adrenal gland which is technically indeterminate but stable since remote prior examination of 05/09/2017 and most in keeping with a benign adrenal adenoma. No follow up imaging is recommended. KIDNEYS, URETERS AND BLADDER: 5 mm nonobstructing calculus noted within the upper pole of the left kidney. The kidneys are otherwise unremarkable. No hydronephrosis. No perinephric or periureteral stranding. Urinary bladder is unremarkable. GI AND BOWEL: There is marked thickening of the distal esophagus which is nonspecific but may relate to underlying esophagitis, such as reflux esophagitis. This could be better assessed with endoscopy if indicated. Small hiatal hernia. Appendexam. The stomach, small bowel, and large bowel are otherwise unremarkable. There is no bowel obstruction. No abnormal bowel wall thickening or distension. REPRODUCTIVE: Reproductive organs are unremarkable. PERITONEUM AND RETROPERITONEUM: No ascites or free air. LYMPH NODES: No lymphadenopathy. BONES AND SOFT TISSUES: Osseous structures are age appropriate. No acute bone abnormality. No lytic or blastic bone lesion. No focal soft tissue abnormality. IMPRESSION: 1. No pulmonary embolism. 2. No acute abnormality identified in the abdomen or pelvis. 3. New bibasilar pulmonary consolidation, which may reflect multifocal infection or aspiration, with associated airway impaction in the right lower lobe and volume  loss/rounded atelectasis  in the left lower lobe. 4. Right pleural effusion is slightly enlarged, no partial lobulated. 5. Stable left pleural effusion again demonstrating complexity with marked pleural thickening. This is nonspecific , but may represent the sequela of prior infection or inflammation, such as the pulmonary infarction and associated reactive pleural effusion noted on 02/09/2024. . 6. New shotty mediastinal adenopathy, likely reactive/inflammatory. 7. Marked distal esophageal wall thickening, which may reflect esophagitis, with small hiatal hernia. Correlation with endoscopy may be helpful for further evaluation. 8. Cardiomegaly and mild coronary artery calcification. 9. Stable 16 mm left adrenal nodule since 2018, most consistent with a benign adrenal adenoma, with no follow-up imaging recommended. Electronically signed by: Dorethia Molt MD 05/27/2024 02:28 AM EST RP Workstation: [id number]   DG Chest 2 View Result Date: 05/26/2024 EXAM: 2 VIEW(S) XRAY OF THE CHEST 05/26/2024 10:24:12 PM COMPARISON: 02/09/2024 CLINICAL HISTORY: chest pain FINDINGS: LUNGS AND PLEURA: New small bilateral pleural effusions. Bibasilar infiltrates have increased on the left. There is central pulmonary vascular congestion. No pneumothorax. HEART AND MEDIASTINUM: Moderate cardiomegaly. The heart is enlarged. BONES AND SOFT TISSUES: No acute osseous abnormality. IMPRESSION: 1. New small bilateral pleural effusions. 2. Increased bibasilar infiltrates, left greater than right. 3. Moderate cardiomegaly with central pulmonary vascular congestion. Electronically signed by: Greig Pique MD 05/26/2024 10:55 PM EST RP Workstation: [id number]      Subjective:   Discharge Exam: Vitals:   05/31/24 0509 05/31/24 0751  BP:    Pulse: 100   Resp: 18 17  Temp:  98.4 F (36.9 C)  SpO2: 95%    Vitals:   05/31/24 0406 05/31/24 0500 05/31/24 0509 05/31/24 0751  BP: 119/82     Pulse: (!) 109  100   Resp: 18  18 17   Temp:  98.4 F (36.9 C)   98.4 F (36.9 C)  TempSrc: Oral   Oral  SpO2: 96%  95%   Weight:  96.9 kg    Height:        General: Pt is alert, awake, not in acute distress Cardiovascular: RRR, S1/S2 +, no rubs, no gallops Respiratory: CTA bilaterally, no wheezing, no rhonchi Abdominal: Soft, NT, ND, bowel sounds + Extremities: no edema, no cyanosis    The results of significant diagnostics from this hospitalization (including imaging, microbiology, ancillary and laboratory) are listed below for reference.     Microbiology: Recent Results (from the past 240 hours)  Blood culture (routine x 2)     Status: None (Preliminary result)   Collection Time: 05/27/24  3:00 AM   Specimen: BLOOD  Result Value Ref Range Status   Specimen Description BLOOD LEFT ANTECUBITAL  Final   Special Requests   Final    BOTTLES DRAWN AEROBIC AND ANAEROBIC Blood Culture adequate volume   Culture   Final    NO GROWTH 4 DAYS Performed at [facility], [address] [place], [place] [address]    Report Status PENDING  Incomplete  Blood culture (routine x 2)     Status: None (Preliminary result)   Collection Time: 05/27/24  3:40 AM   Specimen: BLOOD  Result Value Ref Range Status   Specimen Description BLOOD RIGHT ANTECUBITAL  Final   Special Requests   Final    BOTTLES DRAWN AEROBIC AND ANAEROBIC Blood Culture adequate volume   Culture   Final    NO GROWTH 4 DAYS Performed at [facility], [address], [place], [place] [address]    Report Status PENDING  Incomplete  Resp panel by RT-PCR (RSV, Flu A&B, Covid) Anterior Nasal Swab     Status: None   Collection Time: 05/27/24  7:43 AM   Specimen: Anterior Nasal Swab  Result Value Ref Range Status   SARS Coronavirus 2 by RT PCR NEGATIVE NEGATIVE Final   Influenza A by PCR NEGATIVE NEGATIVE Final   Influenza B by PCR NEGATIVE NEGATIVE Final    Comment: (NOTE) The Xpert Xpress SARS-CoV-2/FLU/RSV plus assay is intended as an aid in the  diagnosis of influenza from Nasopharyngeal swab specimens and should not be used as a sole basis for treatment. Nasal washings and aspirates are unacceptable for Xpert Xpress SARS-CoV-2/FLU/RSV testing.  Fact Sheet for Patients: bloggercourse.com  Fact Sheet for Healthcare Providers: seriousbroker.it  This test is not yet approved or cleared by the [place]  FDA and has been authorized for detection and/or diagnosis of SARS-CoV-2 by FDA under an Emergency Use Authorization (EUA). This EUA will remain in effect (meaning this test can be used) for the duration of the COVID-19 declaration under Section 564(b)(1) of the Act, 21 U.S.C. section 360bbb-3(b)(1), unless the authorization is terminated or revoked.     Resp Syncytial Virus by PCR NEGATIVE NEGATIVE Final    Comment: (NOTE) Fact Sheet for Patients: bloggercourse.com  Fact Sheet for Healthcare Providers: seriousbroker.it  This test is not yet approved or cleared by the [place]  FDA and has been authorized for detection and/or diagnosis of SARS-CoV-2 by FDA under an Emergency Use Authorization (EUA). This EUA will remain in effect (meaning this test can be used) for the duration of the COVID-19 declaration under Section 564(b)(1) of the Act, 21 U.S.C. section 360bbb-3(b)(1), unless the authorization is terminated or revoked.  Performed at [facility], [address], [place], [place] [address]      Labs: BNP (last 3 results) Recent Labs    02/09/24 0935 02/24/24 1010 03/28/24 0927  BNP 1,666.8* 603.3* 282.6*   Basic Metabolic Panel: Recent Labs  Lab 05/27/24 0340 05/28/24 0241 05/29/24 0224 05/30/24 0300 05/30/24 1035 05/31/24 0301  NA 132* 134* 133* 134* 138  139 135  K 4.5 4.5 3.9 3.8 4.0  3.9 4.3  CL 99 98 97* 100  --  102  CO2 22 26 27 23   --  22  GLUCOSE 99 89 119* 136*  --  128*   BUN 14 24* 22* 25*  --  22*  CREATININE 0.94 1.09 1.05 1.05  --  0.88  CALCIUM  8.5* 8.4* 8.3* 8.1*  --  8.2*  MG 1.8  --  2.0 2.1  --   --   PHOS 2.7  --  3.0 2.3*  --   --    Liver Function Tests: Recent Labs  Lab 05/27/24 0139 05/28/24 0241 05/29/24 0224 05/31/24 0301  AST 152* 68* 39 27  ALT 563* 377* 270* 134*  ALKPHOS 99 95 104 86  BILITOT 1.8* 1.0 0.8 0.5  PROT 7.1 6.7 7.0 7.0  ALBUMIN 3.3* 3.0* 3.0* 2.8*   Recent Labs  Lab 05/27/24 0139  LIPASE 24   No results for input(s): AMMONIA in the last 168 hours. CBC: Recent Labs  Lab 05/27/24 0340 05/28/24 0241 05/29/24 0224 05/30/24 0300 05/30/24 1035 05/31/24 0301  WBC 9.7 9.1 8.4 8.0  --  6.5  NEUTROABS 7.4  --   --   --   --   --   HGB 11.4* 11.0* 11.8* 12.7* 12.9*  12.9* 12.3*  HCT 36.4* 34.0* 36.1* 38.9* 38.0*  38.0* 38.9*  MCV 96.3 94.4 92.1 93.3  --  94.0  PLT 181 201 240 330  --  362   Cardiac Enzymes: No results for input(s): CKTOTAL, CKMB, CKMBINDEX, TROPONINI in the last 168 hours. BNP: Invalid input(s): POCBNP CBG: No results for input(s): GLUCAP in the last 168 hours. D-Dimer Recent Labs    05/29/24 1915  DDIMER 10.35*   Hgb A1c No results for input(s): HGBA1C in the last 72 hours. Lipid Profile No results for input(s): CHOL, HDL, LDLCALC, TRIG, CHOLHDL, LDLDIRECT in the last 72 hours. Thyroid function studies No results for input(s): TSH, T4TOTAL, T3FREE, THYROIDAB in the last 72 hours.  Invalid input(s): FREET3 Anemia work up No results for input(s): VITAMINB12, FOLATE, FERRITIN, TIBC, IRON, RETICCTPCT in the last 72 hours. Urinalysis    Component Value Date/Time   COLORURINE COLORLESS (A) 07/08/2023 1133   APPEARANCEUR CLEAR 07/08/2023 1133   LABSPEC 1.004 (L) 07/08/2023 1133   PHURINE 7.0 07/08/2023 1133   GLUCOSEU NEGATIVE 07/08/2023 1133   HGBUR NEGATIVE 07/08/2023 1133   BILIRUBINUR NEGATIVE 07/08/2023 1133   KETONESUR  NEGATIVE 07/08/2023 1133   PROTEINUR NEGATIVE 07/08/2023 1133   NITRITE NEGATIVE 07/08/2023 1133   LEUKOCYTESUR NEGATIVE 07/08/2023 1133   Sepsis Labs Recent Labs  Lab 05/28/24 0241 05/29/24 0224 05/30/24 0300 05/31/24 0301  WBC 9.1 8.4 8.0 6.5   Microbiology Recent Results (from the past 240 hours)  Blood culture (routine x 2)     Status: None (Preliminary result)   Collection Time: 05/27/24  3:00 AM   Specimen: BLOOD  Result Value Ref Range Status   Specimen Description BLOOD LEFT ANTECUBITAL  Final   Special Requests   Final    BOTTLES DRAWN AEROBIC AND ANAEROBIC Blood Culture adequate volume   Culture   Final    NO GROWTH 4 DAYS Performed at [facility], [name], [place], [place] [address]    Report Status PENDING  Incomplete  Blood culture (routine x 2)     Status: None (Preliminary result)   Collection Time: 05/27/24  3:40 AM   Specimen: BLOOD  Result Value Ref Range Status   Specimen Description BLOOD RIGHT ANTECUBITAL  Final   Special Requests   Final    BOTTLES DRAWN AEROBIC AND ANAEROBIC Blood Culture adequate volume   Culture   Final    NO GROWTH 4 DAYS Performed at [facility], [address], [place], [place] [address]    Report Status PENDING  Incomplete  Resp panel by RT-PCR (RSV, Flu A&B, Covid) Anterior Nasal Swab     Status: None   Collection Time: 05/27/24  7:43 AM   Specimen: Anterior Nasal Swab  Result Value Ref Range Status   SARS Coronavirus 2 by RT PCR NEGATIVE NEGATIVE Final   Influenza A by PCR NEGATIVE NEGATIVE Final   Influenza B by PCR NEGATIVE NEGATIVE Final    Comment: (NOTE) The Xpert Xpress SARS-CoV-2/FLU/RSV plus assay is intended as an aid in the diagnosis of influenza from Nasopharyngeal swab specimens and should not be used as a sole basis for treatment. Nasal washings and aspirates are unacceptable for Xpert Xpress SARS-CoV-2/FLU/RSV testing.  Fact Sheet for  Patients: bloggercourse.com  Fact Sheet for Healthcare Providers: seriousbroker.it  This test is not yet approved or cleared by the [place]  FDA and has been authorized for detection and/or diagnosis of SARS-CoV-2 by FDA under an Emergency Use Authorization (EUA). This EUA will remain in effect (meaning this test can  be used) for the duration of the COVID-19 declaration under Section 564(b)(1) of the Act, 21 U.S.C. section 360bbb-3(b)(1), unless the authorization is terminated or revoked.     Resp Syncytial Virus by PCR NEGATIVE NEGATIVE Final    Comment: (NOTE) Fact Sheet for Patients: bloggercourse.com  Fact Sheet for Healthcare Providers: seriousbroker.it  This test is not yet approved or cleared by the [place]  FDA and has been authorized for detection and/or diagnosis of SARS-CoV-2 by FDA under an Emergency Use Authorization (EUA). This EUA will remain in effect (meaning this test can be used) for the duration of the COVID-19 declaration under Section 564(b)(1) of the Act, 21 U.S.C. section 360bbb-3(b)(1), unless the authorization is terminated or revoked.  Performed at [facility], [address], [place], [place] [address]     Please note: You were cared for by a hospitalist during your hospital stay. Once you are discharged, your primary care physician will handle any further medical issues. Please note that NO REFILLS for any discharge medications will be authorized once you are discharged, as it is imperative that you return to your primary care physician (or establish a relationship with a primary care physician if you do not have one) for your post hospital discharge needs so that they can reassess your need for medications and monitor your lab values.    Time coordinating discharge: 40 minutes  SIGNED:   Ivonne Mustache, MD  [facility] 05/31/2024, 12:49 PM Pager [phone]  If 7PM-7AM, please contact night-coverage [email or web address] Password [email or web address]    [1] No Known Allergies [2] No Known Allergies
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
## 2024-05-31 NOTE — Progress Notes (Signed)
 Reviewed AVS, patient expressed understanding of medications, MD follow up reviewed.   Removed IV, Site clean, dry and intact.  See LDA for information on wounds at discharge. CCMD contacted and informed patients is being discharged.  Patient states all belongings brought to the hospital at time of admission are accounted for and packed to take home.  Picked up medications from [facility] pharmacy. Patient refused to go to discharge lounge.  Nurse Manager notified.
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
## 2024-05-31 NOTE — Progress Notes (Signed)
 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  Also reported persistent fluid retention with associated lower extremity swelling and abdominal discomfort.  Workup on admission concerning for combined systolic and diastolic congestive heart failure exacerbation patient was admitted per hospitalist.  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  Patient feels well and is asking to go home. Objective    Blood pressure 119/82, pulse (!) 109, temperature 98.4 F (36.9 C), temperature source Oral, resp. rate 17, height 6' 6 (1.981 m), weight 96.9 kg, SpO2 95%.        Intake/Output Summary (Last 24 hours) at 05/31/2024 1335 Last data filed at 05/31/2024 0916 Gross per 24 hour  Intake 6 ml  Output --  Net 6 ml   Filed Weights    05/29/24 0500 05/30/24 0242 05/31/24 0500  Weight: 96.8 kg 95.8 kg 96.9 kg    Examination: General : No acute distress HEENT: AC/NT Chest: Clear to auscultation bilaterally Heart: RRR, normal S1, S2, no M/R/G Abdomen: Soft, nontender, nondistended Neuro: Motor and sensation grossly intact Extremities: Warm, well-perfused   Resolved problem list   Assessment and Plan  New onset hemoptysis Hemoptysis initially suspected to be in the setting of pulmonary edema and anticoagulation however right heart cath with normal wedge pressure left suggestive of etiology.  Other potential include committee acquired pneumonia with airway irritation causing bleeding especially on anticoagulation.  Possibly improving with antibiotics and TXA nebs.  Has not had any further episodes of hemoptysis today.  Plan to discharge home.  Patient instructed to come back to the ER if he has worsening hemoptysis or shortness of breath.  Community-acquired pneumonia -Patient presented with dyspnea and cough with associated hemoptysis CTA negative for acute PE but did reveal new bibasilar pulmonary consolidation concerning for multifocal pneumonia or aspiration with a right impaction of the right lower lobe and volume loss in the left lower lobe.   Completed 3 days of ceftriaxone  and azithromycin .  Subsequently switched to Augmentin .   Bilateral pleural effusions  -History of right parapneumonic effusion, s/p smallbore chest tube placed February 2025   History of pulmonary embolism anticoagulated with Coumadin  at baseline Supratherapeutic INR End-stage cardiomyopathy - Most recent echocardiogram September 2025 with EF less than 20% and grade 2 diastolic dysfunction     Critical care time: NA    Zola Herter, MD [facility] Office: [phone]   See Amion for personal pager PCCM on call pager [phone]  until  7pm. Please call Elink 7p-7a. [phone]          "

## 2024-05-31 NOTE — TOC Transition Note (Signed)
 Transition of Care [facility]) - Discharge Note   Patient Details  Name: Anthony Singleton MRN: [id number] Date of Birth: [birth year]  Transition of Care [facility]) CM/SW Contact:  [name] Phone Number: [phone] 05/31/2024, 1:47 PM   Clinical Narrative:   HF CSW called to schedule patients hospital follow up appointment for 06/08/24 at 2:00 PM. Family will provide transportation home.           Patient Goals and CMS Choice            Discharge Placement                       Discharge Plan and Services Additional resources added to the After Visit Summary for                                       Social Drivers of Health (SDOH) Interventions SDOH Screenings   Food Insecurity: No Food Insecurity (05/30/2024)  Housing: Low Risk (05/30/2024)  Transportation Needs: No Transportation Needs (05/30/2024)  Utilities: Not At Risk (05/30/2024)  Depression (PHQ2-9): Low Risk (02/25/2024)  Social Connections: Unknown (05/30/2024)  Tobacco Use: Low Risk (05/26/2024)     Readmission Risk Interventions    02/12/2024   12:48 PM  Readmission Risk Prevention Plan  Transportation Screening Complete  PCP or Specialist Appt within 3-5 Days Complete  HRI or Home Care Consult Complete  Social Work Consult for Recovery Care Planning/Counseling Complete  Palliative Care Screening Not Applicable  Medication Review Oceanographer) Referral to Pharmacy
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
## 2024-06-01 ENCOUNTER — Telehealth: Payer: Self-pay

## 2024-06-01 LAB — CULTURE, BLOOD (ROUTINE X 2)
Culture: NO GROWTH
Culture: NO GROWTH
Special Requests: ADEQUATE
Special Requests: ADEQUATE

## 2024-06-01 NOTE — Patient Instructions (Addendum)
 Visit Information  Thank you for taking time to visit with me today. Please don't hesitate to contact me if I can be of assistance to you   Monitor for Increased weight of 2-3 lbs in one day, increased SOB, edema and fatigue, decreased activity intolerance and/or more difficulty sleeping. Call provider or go to the ER if symptoms are severe.  Pneumonia: Contact physician if: You have a new or higher fever. You are coughing more deeply or more often. You are having increased shortness of breath. You are not getting better after 2 days (48 hours). You do not get better as expected.   Patient verbalizes understanding of instructions and care plan provided today and agrees to view in MyChart. Active MyChart status and patient understanding of how to access instructions and care plan via MyChart confirmed with patient.     The patient has been provided with contact information for the care management team and has been advised to call with any health related questions or concerns.   Please call the care guide team at [phone] if you need to cancel or reschedule your appointment.   Please call the Suicide and Crisis Lifeline: 988 call the [place]  National Suicide Prevention Lifeline: (636)536-1963 or TTY: 669-711-6021 TTY 314 087 1579) to talk to a trained counselor if you are experiencing a Mental Health or Behavioral Health Crisis or need someone to talk to.  Sohan Potvin J. Joselynn Amoroso RN, MSN [facility], [facility] RN Care Manager Direct Dial: [phone]  Fax: [phone] Website: [email or web address]

## 2024-06-01 NOTE — Transitions of Care (Post Inpatient/ED Visit) (Signed)
 "  06/01/2024  Name: Anthony Singleton MRN: [id number] DOB: 12-Jan-1971  Today's TOC FU Call Status: Today's TOC FU Call Status:: Successful TOC FU Call Completed TOC FU Call Complete Date: 06/01/24  Patient's Name and Date of Birth confirmed. Name, DOB  Transition Care Management Follow-up Telephone Call Date of Discharge: 05/31/24 Discharge Facility: [facility]) Type of Discharge: Inpatient Admission Primary Inpatient Discharge Diagnosis:: Acute on chronic combined systolic and diastolic heart failure How have you been since you were released from the hospital?: Better Any questions or concerns?: No  Items Reviewed: Did you receive and understand the discharge instructions provided?: Yes Medications obtained,verified, and reconciled?: Yes (Medications Reviewed) Any new allergies since your discharge?: No Dietary orders reviewed?: Yes Type of Diet Ordered:: Heart Healthy low sodium Do you have support at home?: Yes People in Home [RPT]: child(ren), dependent, sibling(s) Name of Support/Comfort Primary Source: Darina brother  Medications Reviewed Today: Medications Reviewed Today     Reviewed by Etna Forquer, RN (Case Manager) on 06/01/24 at 1028  Med List Status: <None>   Medication Order Taking? Sig Documenting Provider Last Dose Status Informant  acetaminophen  (TYLENOL ) 500 MG tablet [id number] Yes Take 1,000 mg by mouth 2 (two) times daily as needed for moderate pain (pain score 4-6), headache or fever. [provider]  Active Self, Pharmacy Records  amoxicillin -clavulanate (AUGMENTIN ) 875-125 MG tablet [id number] Yes Take 1 tablet by mouth every 12 (twelve) hours for 2 days. Jillian Buttery, MD  Active   calcium  elemental as carbonate (TUMS ULTRA 1000) 400 MG chewable tablet [id number] Yes Chew 1,000 mg by mouth 2 (two) times daily as needed for heartburn (indigestion). [provider]  Active Pharmacy Records, Self  dapagliflozin  propanediol (FARXIGA ) 10  MG TABS tablet [id number] Yes Take 1 tablet (10 mg total) by mouth daily. Glena Harlene HERO, FNP  Active Pharmacy Records, Self  digoxin  (LANOXIN ) 0.125 MG tablet [id number] Yes Take 0.125 mg by mouth daily. [provider]  Active Pharmacy Records, Self  diphenhydramine -acetaminophen  (TYLENOL  PM) 25-500 MG TABS tablet [id number] Yes Take 1-2 tablets by mouth at bedtime as needed (sleep, pain). [provider]  Active Pharmacy Records, Self  furosemide  (LASIX ) 20 MG tablet [id number] Yes Take 1 tablet (20 mg total) by mouth daily. Jillian Buttery, MD  Active   pantoprazole  (PROTONIX ) 40 MG tablet [id number] Yes Take 1 tablet (40 mg total) by mouth 2 (two) times daily. Jillian Buttery, MD  Active   sacubitril -valsartan  (ENTRESTO ) 49-51 MG [id number] Yes Take 1 tablet by mouth 2 (two) times daily. Jillian Buttery, MD  Active   spironolactone  (ALDACTONE ) 25 MG tablet [id number] Yes Take 1 tablet (25 mg total) by mouth daily. Jillian Buttery, MD  Active   sucralfate  (CARAFATE ) 1 g tablet [id number] Yes Take 1 tablet (1 g total) by mouth 4 (four) times daily. Jillian Buttery, MD  Active   warfarin (COUMADIN ) 5 MG tablet [id number]  Take 1 tablet (5 mg total) by mouth daily.  Patient taking differently: Take 2.5-5 mg by mouth See admin instructions. Take 1 tablet (5mg ) by mouth every Monday, Wednesday and Friday in the morning. Take 1/2 tablet (2.5mg ) by mouth on all other days.   Zenaida Morene PARAS, MD  Active Pharmacy Records, Self            Home Care and Equipment/Supplies: Were Home Health Services Ordered?: NA Any new equipment or medical supplies ordered?: NA  Functional Questionnaire: Do you need assistance with bathing/showering or dressing?: No Do you need  assistance with meal preparation?: No Do you need assistance with eating?: No Do you have difficulty maintaining continence: No Do you need assistance with getting out of bed/getting out of a chair/moving?: No Do you have  difficulty managing or taking your medications?: No  Follow up appointments reviewed: PCP Follow-up appointment confirmed?: Yes Date of PCP follow-up appointment?: 06/08/24 Follow-up Provider: [facility] Follow-up appointment confirmed?: Yes Date of Specialist follow-up appointment?: 06/09/24 Follow-Up Specialty Provider:: Cardiology Do you need transportation to your follow-up appointment?: No Do you understand care options if your condition(s) worsen?: Yes-patient verbalized understanding  SDOH Interventions Today    Flowsheet Row Most Recent Value  SDOH Interventions   Food Insecurity Interventions Intervention Not Indicated  Housing Interventions Intervention Not Indicated  Transportation Interventions Intervention Not Indicated  Utilities Interventions Intervention Not Indicated    Dontario Evetts J. Shamaine Mulkern RN, MSN [facility]  [facility], [facility] RN Care Manager Direct Dial: [phone]  Fax: [phone] Website: [email or web address]   "

## 2024-06-07 ENCOUNTER — Ambulatory Visit

## 2024-06-08 ENCOUNTER — Inpatient Hospital Stay: Admitting: Physician Assistant
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
## 2024-06-08 NOTE — Assessment & Plan Note (Signed)
 Anthony Singleton

## 2024-06-08 NOTE — Progress Notes (Unsigned)
 "  Subjective:  Patient ID: Anthony Singleton, male    DOB: 1971/03/30  Age: 54 y.o. MRN: [id number]  No chief complaint on file.   HPI: Discussed the use of AI scribe software for clinical note transcription with the patient, who gave verbal consent to proceed.         06/01/2024   10:33 AM 02/25/2024   11:20 AM 01/19/2019    1:47 PM  Depression screen PHQ 2/9  Decreased Interest 0 0 2  Down, Depressed, Hopeless 0 0 2  PHQ - 2 Score 0 0 4  Altered sleeping   2  Tired, decreased energy   2  Change in appetite   2  Feeling bad or failure about yourself    2  Trouble concentrating   2  Moving slowly or fidgety/restless   2  Suicidal thoughts   2  PHQ-9 Score   18   Difficult doing work/chores   Extremely dIfficult     Data saved with a previous flowsheet row definition        06/01/2024   10:30 AM  Fall Risk   Falls in the past year? 0    Patient Care Team: Sirivol, Mamatha, MD as PCP - General (Family Medicine)   Review of Systems  Constitutional:  Negative for appetite change, fatigue and fever.  HENT:  Negative for congestion, ear pain, sinus pressure and sore throat.   Respiratory:  Negative for cough, chest tightness, shortness of breath and wheezing.   Cardiovascular:  Negative for chest pain and palpitations.  Gastrointestinal:  Negative for abdominal pain, constipation, diarrhea, nausea and vomiting.  Genitourinary:  Negative for dysuria and hematuria.  Musculoskeletal:  Negative for arthralgias, back pain, joint swelling and myalgias.  Skin:  Negative for rash.  Neurological:  Negative for dizziness, weakness and headaches.  Psychiatric/Behavioral:  Negative for dysphoric mood. The patient is not nervous/anxious.     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   Tinnitus, left     Past Surgical History:  Procedure Laterality Date   APPENDECTOMY     when he was youmg, per wife   LEFT HEART CATH AND CORONARY ANGIOGRAPHY N/A 07/16/2023   Procedure: LEFT HEART CATH AND CORONARY ANGIOGRAPHY;  Surgeon: Rolan Ezra RAMAN, MD;  Location: [facility] INVASIVE CV LAB;  Service: Cardiovascular;  Laterality: N/A;   RIGHT HEART CATH Right 05/30/2024   Procedure: RIGHT HEART CATH;  Surgeon: Zenaida Morene PARAS, MD;  Location: [facility] INVASIVE CV LAB;  Service: Cardiovascular;  Laterality: Right;  Will place IV in cath lab    Family History  Problem Relation Age of Onset   Stroke Mother    Stroke Father    Social History   Socioeconomic History   Marital status: Married    Spouse name: Not on file   Number of children: Not on file   Years of education: Not on file   Highest education level: Not on file  Occupational History   Not on file  Tobacco Use   Smoking status: Never   Smokeless tobacco: Never  Vaping Use   Vaping status: Never Used  Substance and Sexual Activity   Alcohol use: No   Drug use: Yes    Types: Amphetamines, Marijuana    Comment: crack pipe  found on person, + for amphetamines, THC   Sexual activity: Yes  Other Topics Concern   Not on file  Social History Narrative   Not on file   Social Drivers of Health   Tobacco Use: Low Risk (05/26/2024)   Patient History    Smoking Tobacco Use: Never    Smokeless Tobacco Use: Never    Passive Exposure: Not on file  Financial Resource Strain: Not on file  Food Insecurity: No Food Insecurity (06/01/2024)   Epic    Worried About Programme Researcher, Broadcasting/film/video in the Last Year: Never true    Ran Out of Food in the Last Year: Never true  Transportation Needs: No Transportation Needs (06/01/2024)   Epic    Lack of Transportation (Medical): No    Lack of Transportation (Non-Medical): No  Physical Activity: Not on file  Stress: Not on file  Social Connections: Unknown (05/30/2024)   Social Connection and Isolation Panel     Frequency of Communication with Friends and Family: Not on file    Frequency of Social Gatherings with Friends and Family: Not on file    Attends Religious Services: Not on file    Active Member of Clubs or Organizations: Not on file    Attends Banker Meetings: Not on file    Marital Status: Divorced  Depression (PHQ2-9): Low Risk (06/01/2024)   Depression (PHQ2-9)    PHQ-2 Score: 0  Alcohol Screen: Not on file  Housing: Unknown (06/01/2024)   Epic    Unable to Pay for Housing in the Last Year: No    Number of Times Moved in the Last Year: Not on file    Homeless in the Last Year: No  Utilities: Not At Risk (06/01/2024)   Epic    Threatened with loss of utilities: No  Health Literacy: Not on file    Objective:  There were no vitals taken for this visit.     06/01/2024   10:28 AM 05/31/2024    5:00 AM 05/31/2024    4:06 AM  BP/Weight  Systolic BP   119  Diastolic BP   82  Wt. (Lbs) 213 213.7   BMI 24.61 kg/m2 24.7 kg/m2     Physical Exam    Lab Results  Component Value Date   WBC 6.5 05/31/2024   HGB 12.3 (L) 05/31/2024   HCT 38.9 (L) 05/31/2024   PLT 362 05/31/2024   GLUCOSE 128 (H) 05/31/2024   CHOL 157 07/15/2023   TRIG 86 07/15/2023   HDL 34 (L) 07/15/2023   LDLDIRECT 119 (H) 07/15/2023   LDLCALC 106 (H) 07/15/2023   ALT 134 (H) 05/31/2024   AST 27 05/31/2024   NA 135 05/31/2024   K 4.3 05/31/2024   CL 102 05/31/2024   CREATININE 0.88 05/31/2024   BUN 22 (H) 05/31/2024   CO2 22 05/31/2024   TSH 1.409 07/08/2023   INR 2.1 (H) 05/31/2024   HGBA1C 5.7 (H) 07/16/2023    Results for orders placed or performed during the hospital encounter of 05/26/24  Basic metabolic panel   Collection Time: 05/26/24  9:54 PM  Result Value Ref Range   Sodium 134 (L) 135 - 145 mmol/L   Potassium 4.6 3.5 - 5.1 mmol/L   Chloride 103 98 - 111 mmol/L   CO2 21 (L) 22 - 32 mmol/L   Glucose, Bld 110 (H) 70 - 99 mg/dL   BUN 15 6 - 20 mg/dL   Creatinine, Ser 9.08 0.61  -  1.24 mg/dL   Calcium  8.4 (L) 8.9 - 10.3 mg/dL   GFR, Estimated >39 >39 mL/min   Anion gap 10 5 - 15  CBC   Collection Time: 05/26/24  9:54 PM  Result Value Ref Range   WBC 9.9 4.0 - 10.5 K/uL   RBC 3.98 (L) 4.22 - 5.81 MIL/uL   Hemoglobin 12.0 (L) 13.0 - 17.0 g/dL   HCT 61.8 (L) 60.9 - 47.9 %   MCV 95.7 80.0 - 100.0 fL   MCH 30.2 26.0 - 34.0 pg   MCHC 31.5 30.0 - 36.0 g/dL   RDW 84.0 (H) 88.4 - 84.4 %   Platelets 180 150 - 400 K/uL   nRBC 0.0 0.0 - 0.2 %  Troponin T, High Sensitivity   Collection Time: 05/26/24  9:54 PM  Result Value Ref Range   Troponin T High Sensitivity 27 (H) 0 - 19 ng/L  Troponin T, High Sensitivity   Collection Time: 05/27/24 12:13 AM  Result Value Ref Range   Troponin T High Sensitivity 29 (H) 0 - 19 ng/L  Protime-INR   Collection Time: 05/27/24  1:39 AM  Result Value Ref Range   Prothrombin Time 19.2 (H) 11.4 - 15.2 seconds   INR 1.5 (H) 0.8 - 1.2  Pro Brain natriuretic peptide   Collection Time: 05/27/24  1:39 AM  Result Value Ref Range   Pro Brain Natriuretic Peptide 5,425.0 (H) <300.0 pg/mL  Hepatic function panel   Collection Time: 05/27/24  1:39 AM  Result Value Ref Range   Total Protein 7.1 6.5 - 8.1 g/dL   Albumin 3.3 (L) 3.5 - 5.0 g/dL   AST 847 (H) 15 - 41 U/L   ALT 563 (H) 0 - 44 U/L   Alkaline Phosphatase 99 38 - 126 U/L   Total Bilirubin 1.8 (H) 0.0 - 1.2 mg/dL   Bilirubin, Direct 0.5 (H) 0.0 - 0.2 mg/dL   Indirect Bilirubin 1.3 (H) 0.3 - 0.9 mg/dL  Lipase, blood   Collection Time: 05/27/24  1:39 AM  Result Value Ref Range   Lipase 24 11 - 51 U/L  Blood culture (routine x 2)   Collection Time: 05/27/24  3:00 AM   Specimen: BLOOD  Result Value Ref Range   Specimen Description BLOOD LEFT ANTECUBITAL    Special Requests      BOTTLES DRAWN AEROBIC AND ANAEROBIC Blood Culture adequate volume   Culture      NO GROWTH 5 DAYS Performed at [facility], [address], [place], [place] [address]    Report Status  06/01/2024 FINAL   Blood culture (routine x 2)   Collection Time: 05/27/24  3:40 AM   Specimen: BLOOD  Result Value Ref Range   Specimen Description BLOOD RIGHT ANTECUBITAL    Special Requests      BOTTLES DRAWN AEROBIC AND ANAEROBIC Blood Culture adequate volume   Culture      NO GROWTH 5 DAYS Performed at [facility], [address], [place], [place] [address]    Report Status 06/01/2024 FINAL   Procalcitonin   Collection Time: 05/27/24  3:40 AM  Result Value Ref Range   Procalcitonin 0.26 ng/mL  CBC with Differential/Platelet   Collection Time: 05/27/24  3:40 AM  Result Value Ref Range   WBC 9.7 4.0 - 10.5 K/uL   RBC 3.78 (L) 4.22 - 5.81 MIL/uL   Hemoglobin 11.4 (L) 13.0 - 17.0 g/dL   HCT 63.5 (L) 60.9 - 47.9 %  MCV 96.3 80.0 - 100.0 fL   MCH 30.2 26.0 - 34.0 pg   MCHC 31.3 30.0 - 36.0 g/dL   RDW 84.0 (H) 88.4 - 84.4 %   Platelets 181 150 - 400 K/uL   nRBC 0.0 0.0 - 0.2 %   Neutrophils Relative % 77 %   Neutro Abs 7.4 1.7 - 7.7 K/uL   Lymphocytes Relative 11 %   Lymphs Abs 1.1 0.7 - 4.0 K/uL   Monocytes Relative 12 %   Monocytes Absolute 1.1 (H) 0.1 - 1.0 K/uL   Eosinophils Relative 0 %   Eosinophils Absolute 0.0 0.0 - 0.5 K/uL   Basophils Relative 0 %   Basophils Absolute 0.0 0.0 - 0.1 K/uL   Immature Granulocytes 0 %   Abs Immature Granulocytes 0.04 0.00 - 0.07 K/uL  Magnesium    Collection Time: 05/27/24  3:40 AM  Result Value Ref Range   Magnesium  1.8 1.7 - 2.4 mg/dL  Phosphorus   Collection Time: 05/27/24  3:40 AM  Result Value Ref Range   Phosphorus 2.7 2.5 - 4.6 mg/dL  Basic metabolic panel with GFR   Collection Time: 05/27/24  3:40 AM  Result Value Ref Range   Sodium 132 (L) 135 - 145 mmol/L   Potassium 4.5 3.5 - 5.1 mmol/L   Chloride 99 98 - 111 mmol/L   CO2 22 22 - 32 mmol/L   Glucose, Bld 99 70 - 99 mg/dL   BUN 14 6 - 20 mg/dL   Creatinine, Ser 9.05 0.61 - 1.24 mg/dL   Calcium  8.5 (L) 8.9 - 10.3 mg/dL   GFR, Estimated >39 >39 mL/min    Anion gap 11 5 - 15  Resp panel by RT-PCR (RSV, Flu A&B, Covid) Anterior Nasal Swab   Collection Time: 05/27/24  7:43 AM   Specimen: Anterior Nasal Swab  Result Value Ref Range   SARS Coronavirus 2 by RT PCR NEGATIVE NEGATIVE   Influenza A by PCR NEGATIVE NEGATIVE   Influenza B by PCR NEGATIVE NEGATIVE   Resp Syncytial Virus by PCR NEGATIVE NEGATIVE  Digoxin  level   Collection Time: 05/27/24 12:40 PM  Result Value Ref Range   Digoxin  Level 0.8 0.8 - 2.0 ng/mL  Comprehensive metabolic panel   Collection Time: 05/28/24  2:41 AM  Result Value Ref Range   Sodium 134 (L) 135 - 145 mmol/L   Potassium 4.5 3.5 - 5.1 mmol/L   Chloride 98 98 - 111 mmol/L   CO2 26 22 - 32 mmol/L   Glucose, Bld 89 70 - 99 mg/dL   BUN 24 (H) 6 - 20 mg/dL   Creatinine, Ser 8.90 0.61 - 1.24 mg/dL   Calcium  8.4 (L) 8.9 - 10.3 mg/dL   Total Protein 6.7 6.5 - 8.1 g/dL   Albumin 3.0 (L) 3.5 - 5.0 g/dL   AST 68 (H) 15 - 41 U/L   ALT 377 (H) 0 - 44 U/L   Alkaline Phosphatase 95 38 - 126 U/L   Total Bilirubin 1.0 0.0 - 1.2 mg/dL   GFR, Estimated >39 >39 mL/min   Anion gap 11 5 - 15  Protime-INR   Collection Time: 05/28/24  2:41 AM  Result Value Ref Range   Prothrombin Time 19.6 (H) 11.4 - 15.2 seconds   INR 1.6 (H) 0.8 - 1.2  CBC   Collection Time: 05/28/24  2:41 AM  Result Value Ref Range   WBC 9.1 4.0 - 10.5 K/uL   RBC 3.60 (L) 4.22 - 5.81 MIL/uL  Hemoglobin 11.0 (L) 13.0 - 17.0 g/dL   HCT 65.9 (L) 60.9 - 47.9 %   MCV 94.4 80.0 - 100.0 fL   MCH 30.6 26.0 - 34.0 pg   MCHC 32.4 30.0 - 36.0 g/dL   RDW 84.0 (H) 88.4 - 84.4 %   Platelets 201 150 - 400 K/uL   nRBC 0.0 0.0 - 0.2 %  ECHOCARDIOGRAM COMPLETE   Collection Time: 05/28/24  3:55 PM  Result Value Ref Range   Weight 3,515.2 oz   Height 78 in   BP 134/93 mmHg   Single Plane A2C EF 25.1 %   Single Plane A4C EF 20.7 %   Calc EF 25.1 %   S' Lateral 5.40 cm   Est EF < 20%   Protime-INR   Collection Time: 05/29/24  2:24 AM  Result Value Ref  Range   Prothrombin Time 18.4 (H) 11.4 - 15.2 seconds   INR 1.5 (H) 0.8 - 1.2  CBC   Collection Time: 05/29/24  2:24 AM  Result Value Ref Range   WBC 8.4 4.0 - 10.5 K/uL   RBC 3.92 (L) 4.22 - 5.81 MIL/uL   Hemoglobin 11.8 (L) 13.0 - 17.0 g/dL   HCT 63.8 (L) 60.9 - 47.9 %   MCV 92.1 80.0 - 100.0 fL   MCH 30.1 26.0 - 34.0 pg   MCHC 32.7 30.0 - 36.0 g/dL   RDW 84.4 88.4 - 84.4 %   Platelets 240 150 - 400 K/uL   nRBC 0.0 0.0 - 0.2 %  Magnesium    Collection Time: 05/29/24  2:24 AM  Result Value Ref Range   Magnesium  2.0 1.7 - 2.4 mg/dL  Phosphorus   Collection Time: 05/29/24  2:24 AM  Result Value Ref Range   Phosphorus 3.0 2.5 - 4.6 mg/dL  Comprehensive metabolic panel with GFR   Collection Time: 05/29/24  2:24 AM  Result Value Ref Range   Sodium 133 (L) 135 - 145 mmol/L   Potassium 3.9 3.5 - 5.1 mmol/L   Chloride 97 (L) 98 - 111 mmol/L   CO2 27 22 - 32 mmol/L   Glucose, Bld 119 (H) 70 - 99 mg/dL   BUN 22 (H) 6 - 20 mg/dL   Creatinine, Ser 8.94 0.61 - 1.24 mg/dL   Calcium  8.3 (L) 8.9 - 10.3 mg/dL   Total Protein 7.0 6.5 - 8.1 g/dL   Albumin 3.0 (L) 3.5 - 5.0 g/dL   AST 39 15 - 41 U/L   ALT 270 (H) 0 - 44 U/L   Alkaline Phosphatase 104 38 - 126 U/L   Total Bilirubin 0.8 0.0 - 1.2 mg/dL   GFR, Estimated >39 >39 mL/min   Anion gap 10 5 - 15  D-dimer, quantitative   Collection Time: 05/29/24  7:15 PM  Result Value Ref Range   D-Dimer, Quant 10.35 (H) 0.00 - 0.50 ug/mL-FEU  Troponin T, High Sensitivity   Collection Time: 05/29/24  7:15 PM  Result Value Ref Range   Troponin T High Sensitivity 29 (H) 0 - 19 ng/L  Protime-INR   Collection Time: 05/30/24  3:00 AM  Result Value Ref Range   Prothrombin Time 21.8 (H) 11.4 - 15.2 seconds   INR 1.8 (H) 0.8 - 1.2  CBC   Collection Time: 05/30/24  3:00 AM  Result Value Ref Range   WBC 8.0 4.0 - 10.5 K/uL   RBC 4.17 (L) 4.22 - 5.81 MIL/uL   Hemoglobin 12.7 (L) 13.0 - 17.0 g/dL   HCT  38.9 (L) 39.0 - 52.0 %   MCV 93.3 80.0 -  100.0 fL   MCH 30.5 26.0 - 34.0 pg   MCHC 32.6 30.0 - 36.0 g/dL   RDW 84.6 88.4 - 84.4 %   Platelets 330 150 - 400 K/uL   nRBC 0.0 0.0 - 0.2 %  Basic metabolic panel with GFR   Collection Time: 05/30/24  3:00 AM  Result Value Ref Range   Sodium 134 (L) 135 - 145 mmol/L   Potassium 3.8 3.5 - 5.1 mmol/L   Chloride 100 98 - 111 mmol/L   CO2 23 22 - 32 mmol/L   Glucose, Bld 136 (H) 70 - 99 mg/dL   BUN 25 (H) 6 - 20 mg/dL   Creatinine, Ser 8.94 0.61 - 1.24 mg/dL   Calcium  8.1 (L) 8.9 - 10.3 mg/dL   GFR, Estimated >39 >39 mL/min   Anion gap 11 5 - 15  Phosphorus   Collection Time: 05/30/24  3:00 AM  Result Value Ref Range   Phosphorus 2.3 (L) 2.5 - 4.6 mg/dL  Magnesium    Collection Time: 05/30/24  3:00 AM  Result Value Ref Range   Magnesium  2.1 1.7 - 2.4 mg/dL  POCT I-Stat EG7   Collection Time: 05/30/24 10:35 AM  Result Value Ref Range   pH, Ven 7.394 7.25 - 7.43   pCO2, Ven 46.4 44 - 60 mmHg   pO2, Ven 25 (LL) 32 - 45 mmHg   Bicarbonate 28.3 (H) 20.0 - 28.0 mmol/L   TCO2 30 22 - 32 mmol/L   O2 Saturation 45 %   Acid-Base Excess 3.0 (H) 0.0 - 2.0 mmol/L   Sodium 139 135 - 145 mmol/L   Potassium 3.9 3.5 - 5.1 mmol/L   Calcium , Ion 1.11 (L) 1.15 - 1.40 mmol/L   HCT 38.0 (L) 39.0 - 52.0 %   Hemoglobin 12.9 (L) 13.0 - 17.0 g/dL   Sample type VENOUS   POCT I-Stat EG7   Collection Time: 05/30/24 10:35 AM  Result Value Ref Range   pH, Ven 7.402 7.25 - 7.43   pCO2, Ven 45.6 44 - 60 mmHg   pO2, Ven 25 (LL) 32 - 45 mmHg   Bicarbonate 28.4 (H) 20.0 - 28.0 mmol/L   TCO2 30 22 - 32 mmol/L   O2 Saturation 45 %   Acid-Base Excess 3.0 (H) 0.0 - 2.0 mmol/L   Sodium 138 135 - 145 mmol/L   Potassium 4.0 3.5 - 5.1 mmol/L   Calcium , Ion 1.13 (L) 1.15 - 1.40 mmol/L   HCT 38.0 (L) 39.0 - 52.0 %   Hemoglobin 12.9 (L) 13.0 - 17.0 g/dL   Sample type VENOUS   Protime-INR   Collection Time: 05/31/24  3:01 AM  Result Value Ref Range   Prothrombin Time 24.5 (H) 11.4 - 15.2 seconds   INR  2.1 (H) 0.8 - 1.2  CBC   Collection Time: 05/31/24  3:01 AM  Result Value Ref Range   WBC 6.5 4.0 - 10.5 K/uL   RBC 4.14 (L) 4.22 - 5.81 MIL/uL   Hemoglobin 12.3 (L) 13.0 - 17.0 g/dL   HCT 61.0 (L) 60.9 - 47.9 %   MCV 94.0 80.0 - 100.0 fL   MCH 29.7 26.0 - 34.0 pg   MCHC 31.6 30.0 - 36.0 g/dL   RDW 84.3 (H) 88.4 - 84.4 %   Platelets 362 150 - 400 K/uL   nRBC 0.0 0.0 - 0.2 %  Comprehensive metabolic panel with GFR   Collection  Time: 05/31/24  3:01 AM  Result Value Ref Range   Sodium 135 135 - 145 mmol/L   Potassium 4.3 3.5 - 5.1 mmol/L   Chloride 102 98 - 111 mmol/L   CO2 22 22 - 32 mmol/L   Glucose, Bld 128 (H) 70 - 99 mg/dL   BUN 22 (H) 6 - 20 mg/dL   Creatinine, Ser 9.11 0.61 - 1.24 mg/dL   Calcium  8.2 (L) 8.9 - 10.3 mg/dL   Total Protein 7.0 6.5 - 8.1 g/dL   Albumin 2.8 (L) 3.5 - 5.0 g/dL   AST 27 15 - 41 U/L   ALT 134 (H) 0 - 44 U/L   Alkaline Phosphatase 86 38 - 126 U/L   Total Bilirubin 0.5 0.0 - 1.2 mg/dL   GFR, Estimated >39 >39 mL/min   Anion gap 10 5 - 15  .  Assessment & Plan:   Assessment & Plan Acute on chronic combined systolic and diastolic heart failure (HCC)     Pleural effusion, bilateral     Long term (current) use of anticoagulants       There is no height or weight on file to calculate BMI.  Assessment and Plan      No orders of the defined types were placed in this encounter.   No orders of the defined types were placed in this encounter.      Follow-up: No follow-ups on file.  An After Visit Summary was printed and given to the patient.    I,Kaushik Maul M Majorie Santee,acting as a neurosurgeon for [employer].,have documented all relevant documentation on the behalf of Nola Angles, PA,as directed by  Nola Angles, PA while in the presence of Nola Angles, [place].    Nola Angles, [place] [facility] [phone]    [1]  Current Outpatient Medications on File Prior to Visit  Medication Sig Dispense Refill   acetaminophen   (TYLENOL ) 500 MG tablet Take 1,000 mg by mouth 2 (two) times daily as needed for moderate pain (pain score 4-6), headache or fever.     calcium  elemental as carbonate (TUMS ULTRA 1000) 400 MG chewable tablet Chew 1,000 mg by mouth 2 (two) times daily as needed for heartburn (indigestion).     dapagliflozin  propanediol (FARXIGA ) 10 MG TABS tablet Take 1 tablet (10 mg total) by mouth daily. 90 tablet 3   digoxin  (LANOXIN ) 0.125 MG tablet Take 0.125 mg by mouth daily.     diphenhydramine -acetaminophen  (TYLENOL  PM) 25-500 MG TABS tablet Take 1-2 tablets by mouth at bedtime as needed (sleep, pain).     furosemide  (LASIX ) 20 MG tablet Take 1 tablet (20 mg total) by mouth daily. 30 tablet 0   pantoprazole  (PROTONIX ) 40 MG tablet Take 1 tablet (40 mg total) by mouth 2 (two) times daily. 60 tablet 0   sacubitril -valsartan  (ENTRESTO ) 49-51 MG Take 1 tablet by mouth 2 (two) times daily. 60 tablet 0   spironolactone  (ALDACTONE ) 25 MG tablet Take 1 tablet (25 mg total) by mouth daily. 30 tablet 0   sucralfate  (CARAFATE ) 1 g tablet Take 1 tablet (1 g total) by mouth 4 (four) times daily. 40 tablet 0   warfarin (COUMADIN ) 5 MG tablet Take 1 tablet (5 mg total) by mouth daily. (Patient taking differently: Take 2.5-5 mg by mouth See admin instructions. Take 1 tablet (5mg ) by mouth every Monday, Wednesday and Friday in the morning. Take 1/2 tablet (2.5mg ) by mouth on all other days.) 30 tablet 1   No current facility-administered  medications on file prior to visit.   "

## 2024-06-08 NOTE — Assessment & Plan Note (Signed)
 SABRA

## 2024-06-09 ENCOUNTER — Ambulatory Visit (HOSPITAL_COMMUNITY)

## 2024-06-13 ENCOUNTER — Inpatient Hospital Stay

## 2024-06-14 ENCOUNTER — Ambulatory Visit: Attending: Cardiology

## 2024-06-14 DIAGNOSIS — Z7901 Long term (current) use of anticoagulants: Secondary | ICD-10-CM

## 2024-06-14 DIAGNOSIS — I2699 Other pulmonary embolism without acute cor pulmonale: Secondary | ICD-10-CM

## 2024-06-14 LAB — POCT INR: INR: 1.2 — AB (ref 2.0–3.0)

## 2024-06-14 NOTE — Patient Instructions (Signed)
 Take 1 tablet today only then Increase to 1 tablet daily except 0.5 tablet every Tuesday and Thursday. INR in 2 weeks.  [phone]

## 2024-06-28 ENCOUNTER — Ambulatory Visit

## 2024-07-05 ENCOUNTER — Ambulatory Visit
